# Patient Record
Sex: Female | Born: 1935 | Race: Black or African American | Hispanic: No | Marital: Married | Smoking: Never smoker
Health system: Southern US, Community
[De-identification: ages and names within clinical notes are randomized; demographics above are authoritative.]

## PROBLEM LIST (undated history)

## (undated) DIAGNOSIS — F32A Depression, unspecified: Secondary | ICD-10-CM

## (undated) DIAGNOSIS — M545 Low back pain, unspecified: Secondary | ICD-10-CM

## (undated) DIAGNOSIS — I10 Essential (primary) hypertension: Secondary | ICD-10-CM

## (undated) DIAGNOSIS — M25569 Pain in unspecified knee: Secondary | ICD-10-CM

## (undated) DIAGNOSIS — L80 Vitiligo: Secondary | ICD-10-CM

## (undated) DIAGNOSIS — M1990 Unspecified osteoarthritis, unspecified site: Secondary | ICD-10-CM

## (undated) DIAGNOSIS — F0390 Unspecified dementia without behavioral disturbance: Secondary | ICD-10-CM

## (undated) DIAGNOSIS — T7840XA Allergy, unspecified, initial encounter: Secondary | ICD-10-CM

## (undated) DIAGNOSIS — F329 Major depressive disorder, single episode, unspecified: Secondary | ICD-10-CM

## (undated) HISTORY — DX: Allergy, unspecified, initial encounter: T78.40XA

## (undated) HISTORY — DX: Low back pain, unspecified: M54.50

## (undated) HISTORY — DX: Unspecified osteoarthritis, unspecified site: M19.90

## (undated) HISTORY — DX: Depression, unspecified: F32.A

## (undated) HISTORY — DX: Vitiligo: L80

## (undated) HISTORY — DX: Major depressive disorder, single episode, unspecified: F32.9

## (undated) HISTORY — DX: Unspecified dementia without behavioral disturbance: F03.90

## (undated) HISTORY — PX: ABDOMINAL HYSTERECTOMY: SHX81

## (undated) HISTORY — DX: Low back pain: M54.5

## (undated) HISTORY — DX: Essential (primary) hypertension: I10

## (undated) HISTORY — DX: Pain in unspecified knee: M25.569

## (undated) HISTORY — PX: OTHER SURGICAL HISTORY: SHX169

---

## 1997-12-10 ENCOUNTER — Ambulatory Visit (HOSPITAL_COMMUNITY): Admission: RE | Admit: 1997-12-10 | Discharge: 1997-12-10 | Payer: Self-pay | Admitting: Neurosurgery

## 1998-05-29 ENCOUNTER — Encounter: Admission: RE | Admit: 1998-05-29 | Discharge: 1998-06-19 | Payer: Self-pay | Admitting: Internal Medicine

## 1998-10-10 ENCOUNTER — Encounter: Payer: Self-pay | Admitting: Emergency Medicine

## 1998-10-10 ENCOUNTER — Emergency Department (HOSPITAL_COMMUNITY): Admission: EM | Admit: 1998-10-10 | Discharge: 1998-10-10 | Payer: Self-pay | Admitting: Emergency Medicine

## 1998-10-16 ENCOUNTER — Emergency Department (HOSPITAL_COMMUNITY): Admission: EM | Admit: 1998-10-16 | Discharge: 1998-10-16 | Payer: Self-pay | Admitting: Emergency Medicine

## 1999-10-23 ENCOUNTER — Emergency Department (HOSPITAL_COMMUNITY): Admission: EM | Admit: 1999-10-23 | Discharge: 1999-10-23 | Payer: Self-pay | Admitting: *Deleted

## 2001-08-09 ENCOUNTER — Encounter: Admission: RE | Admit: 2001-08-09 | Discharge: 2001-08-09 | Payer: Self-pay | Admitting: Chiropractic Medicine

## 2001-08-09 ENCOUNTER — Encounter: Payer: Self-pay | Admitting: Chiropractic Medicine

## 2002-03-06 ENCOUNTER — Encounter: Payer: Self-pay | Admitting: Emergency Medicine

## 2002-03-06 ENCOUNTER — Emergency Department (HOSPITAL_COMMUNITY): Admission: EM | Admit: 2002-03-06 | Discharge: 2002-03-06 | Payer: Self-pay | Admitting: *Deleted

## 2002-04-24 ENCOUNTER — Other Ambulatory Visit: Admission: RE | Admit: 2002-04-24 | Discharge: 2002-04-24 | Payer: Self-pay | Admitting: Family Medicine

## 2003-04-25 ENCOUNTER — Encounter (INDEPENDENT_AMBULATORY_CARE_PROVIDER_SITE_OTHER): Payer: Self-pay | Admitting: Specialist

## 2003-04-25 ENCOUNTER — Ambulatory Visit (HOSPITAL_BASED_OUTPATIENT_CLINIC_OR_DEPARTMENT_OTHER): Admission: RE | Admit: 2003-04-25 | Discharge: 2003-04-25 | Payer: Self-pay | Admitting: *Deleted

## 2003-04-25 ENCOUNTER — Ambulatory Visit (HOSPITAL_COMMUNITY): Admission: RE | Admit: 2003-04-25 | Discharge: 2003-04-25 | Payer: Self-pay | Admitting: *Deleted

## 2005-06-24 ENCOUNTER — Encounter: Payer: Self-pay | Admitting: Neurosurgery

## 2005-11-02 ENCOUNTER — Emergency Department (HOSPITAL_COMMUNITY): Admission: EM | Admit: 2005-11-02 | Discharge: 2005-11-02 | Payer: Self-pay | Admitting: Family Medicine

## 2006-02-18 ENCOUNTER — Emergency Department (HOSPITAL_COMMUNITY): Admission: EM | Admit: 2006-02-18 | Discharge: 2006-02-18 | Payer: Self-pay | Admitting: Family Medicine

## 2006-11-01 ENCOUNTER — Emergency Department (HOSPITAL_COMMUNITY): Admission: EM | Admit: 2006-11-01 | Discharge: 2006-11-01 | Payer: Self-pay | Admitting: Emergency Medicine

## 2006-12-22 ENCOUNTER — Other Ambulatory Visit: Admission: RE | Admit: 2006-12-22 | Discharge: 2006-12-22 | Payer: Self-pay | Admitting: Obstetrics and Gynecology

## 2006-12-24 ENCOUNTER — Ambulatory Visit: Payer: Self-pay | Admitting: Internal Medicine

## 2006-12-24 ENCOUNTER — Encounter: Payer: Self-pay | Admitting: Internal Medicine

## 2006-12-24 DIAGNOSIS — F4321 Adjustment disorder with depressed mood: Secondary | ICD-10-CM | POA: Insufficient documentation

## 2006-12-24 DIAGNOSIS — I10 Essential (primary) hypertension: Secondary | ICD-10-CM | POA: Insufficient documentation

## 2006-12-24 DIAGNOSIS — M545 Low back pain: Secondary | ICD-10-CM

## 2007-01-11 ENCOUNTER — Ambulatory Visit: Payer: Self-pay | Admitting: Internal Medicine

## 2007-02-01 ENCOUNTER — Ambulatory Visit: Payer: Self-pay | Admitting: Internal Medicine

## 2007-05-03 ENCOUNTER — Ambulatory Visit: Payer: Self-pay | Admitting: Internal Medicine

## 2007-05-03 DIAGNOSIS — M79609 Pain in unspecified limb: Secondary | ICD-10-CM | POA: Insufficient documentation

## 2007-05-06 ENCOUNTER — Ambulatory Visit: Payer: Self-pay

## 2007-07-06 ENCOUNTER — Ambulatory Visit: Payer: Self-pay | Admitting: Internal Medicine

## 2007-07-06 DIAGNOSIS — M1990 Unspecified osteoarthritis, unspecified site: Secondary | ICD-10-CM

## 2007-07-06 DIAGNOSIS — M25519 Pain in unspecified shoulder: Secondary | ICD-10-CM | POA: Insufficient documentation

## 2007-10-05 ENCOUNTER — Ambulatory Visit: Payer: Self-pay | Admitting: Internal Medicine

## 2007-10-07 ENCOUNTER — Encounter: Payer: Self-pay | Admitting: Internal Medicine

## 2007-10-18 ENCOUNTER — Encounter: Admission: RE | Admit: 2007-10-18 | Discharge: 2007-12-14 | Payer: Self-pay | Admitting: Family Medicine

## 2007-12-27 ENCOUNTER — Other Ambulatory Visit: Admission: RE | Admit: 2007-12-27 | Discharge: 2007-12-27 | Payer: Self-pay | Admitting: Obstetrics and Gynecology

## 2008-01-27 ENCOUNTER — Ambulatory Visit: Payer: Self-pay | Admitting: Internal Medicine

## 2008-02-24 DIAGNOSIS — L80 Vitiligo: Secondary | ICD-10-CM

## 2008-02-24 DIAGNOSIS — F0390 Unspecified dementia without behavioral disturbance: Secondary | ICD-10-CM

## 2008-02-24 HISTORY — DX: Unspecified dementia, unspecified severity, without behavioral disturbance, psychotic disturbance, mood disturbance, and anxiety: F03.90

## 2008-02-24 HISTORY — DX: Vitiligo: L80

## 2008-02-28 ENCOUNTER — Ambulatory Visit: Payer: Self-pay | Admitting: Internal Medicine

## 2008-02-28 DIAGNOSIS — J019 Acute sinusitis, unspecified: Secondary | ICD-10-CM

## 2008-03-05 ENCOUNTER — Emergency Department (HOSPITAL_COMMUNITY): Admission: EM | Admit: 2008-03-05 | Discharge: 2008-03-05 | Payer: Self-pay | Admitting: Family Medicine

## 2008-03-08 ENCOUNTER — Encounter: Payer: Self-pay | Admitting: Internal Medicine

## 2008-03-26 ENCOUNTER — Ambulatory Visit: Payer: Self-pay | Admitting: Internal Medicine

## 2008-03-26 DIAGNOSIS — R930 Abnormal findings on diagnostic imaging of skull and head, not elsewhere classified: Secondary | ICD-10-CM | POA: Insufficient documentation

## 2008-03-26 DIAGNOSIS — R05 Cough: Secondary | ICD-10-CM

## 2008-03-26 DIAGNOSIS — J069 Acute upper respiratory infection, unspecified: Secondary | ICD-10-CM | POA: Insufficient documentation

## 2008-03-29 ENCOUNTER — Telehealth: Payer: Self-pay | Admitting: Internal Medicine

## 2008-04-06 ENCOUNTER — Telehealth: Payer: Self-pay | Admitting: Internal Medicine

## 2008-04-11 ENCOUNTER — Telehealth: Payer: Self-pay | Admitting: Internal Medicine

## 2008-04-27 ENCOUNTER — Ambulatory Visit: Payer: Self-pay | Admitting: Internal Medicine

## 2008-04-27 DIAGNOSIS — J45909 Unspecified asthma, uncomplicated: Secondary | ICD-10-CM

## 2008-04-27 DIAGNOSIS — J309 Allergic rhinitis, unspecified: Secondary | ICD-10-CM | POA: Insufficient documentation

## 2008-05-01 LAB — CONVERTED CEMR LAB
BUN: 25 mg/dL — ABNORMAL HIGH (ref 6–23)
Creatinine, Ser: 0.8 mg/dL (ref 0.4–1.2)
Glucose, Bld: 97 mg/dL (ref 70–99)
Sodium: 143 meq/L (ref 135–145)

## 2008-05-04 ENCOUNTER — Ambulatory Visit: Payer: Self-pay | Admitting: Cardiovascular Disease

## 2008-05-08 ENCOUNTER — Telehealth: Payer: Self-pay | Admitting: Internal Medicine

## 2008-05-09 ENCOUNTER — Telehealth: Payer: Self-pay | Admitting: Internal Medicine

## 2008-05-14 ENCOUNTER — Ambulatory Visit: Payer: Self-pay | Admitting: Internal Medicine

## 2008-05-14 DIAGNOSIS — R42 Dizziness and giddiness: Secondary | ICD-10-CM | POA: Insufficient documentation

## 2008-05-14 DIAGNOSIS — E876 Hypokalemia: Secondary | ICD-10-CM | POA: Insufficient documentation

## 2008-05-18 LAB — CONVERTED CEMR LAB
CO2: 33 meq/L — ABNORMAL HIGH (ref 19–32)
Calcium: 9.4 mg/dL (ref 8.4–10.5)
Chloride: 104 meq/L (ref 96–112)
Creatinine, Ser: 0.8 mg/dL (ref 0.4–1.2)
GFR calc non Af Amer: 90.87 mL/min (ref >60)
Sodium: 142 meq/L (ref 135–145)

## 2008-05-22 ENCOUNTER — Ambulatory Visit: Payer: Self-pay | Admitting: Pulmonary Disease

## 2008-05-22 DIAGNOSIS — J984 Other disorders of lung: Secondary | ICD-10-CM | POA: Insufficient documentation

## 2008-05-28 ENCOUNTER — Ambulatory Visit (HOSPITAL_COMMUNITY): Admission: RE | Admit: 2008-05-28 | Discharge: 2008-05-28 | Payer: Self-pay | Admitting: Internal Medicine

## 2008-05-30 ENCOUNTER — Ambulatory Visit: Payer: Self-pay | Admitting: Internal Medicine

## 2008-06-13 ENCOUNTER — Emergency Department (HOSPITAL_COMMUNITY): Admission: EM | Admit: 2008-06-13 | Discharge: 2008-06-13 | Payer: Self-pay | Admitting: Family Medicine

## 2008-07-03 ENCOUNTER — Telehealth (INDEPENDENT_AMBULATORY_CARE_PROVIDER_SITE_OTHER): Payer: Self-pay | Admitting: *Deleted

## 2008-07-12 ENCOUNTER — Ambulatory Visit: Payer: Self-pay | Admitting: Internal Medicine

## 2008-07-25 ENCOUNTER — Ambulatory Visit: Payer: Self-pay | Admitting: Internal Medicine

## 2008-08-06 ENCOUNTER — Telehealth: Payer: Self-pay | Admitting: Internal Medicine

## 2008-08-10 ENCOUNTER — Ambulatory Visit: Payer: Self-pay | Admitting: Internal Medicine

## 2008-08-20 ENCOUNTER — Ambulatory Visit: Payer: Self-pay | Admitting: Internal Medicine

## 2008-09-04 ENCOUNTER — Ambulatory Visit: Payer: Self-pay | Admitting: Internal Medicine

## 2008-09-05 ENCOUNTER — Ambulatory Visit: Payer: Self-pay | Admitting: Internal Medicine

## 2008-09-12 ENCOUNTER — Encounter: Payer: Self-pay | Admitting: Internal Medicine

## 2008-09-12 ENCOUNTER — Ambulatory Visit: Payer: Self-pay | Admitting: Thoracic Surgery

## 2008-09-21 ENCOUNTER — Ambulatory Visit: Payer: Self-pay | Admitting: Emergency Medicine

## 2008-09-21 ENCOUNTER — Ambulatory Visit (HOSPITAL_COMMUNITY): Admission: RE | Admit: 2008-09-21 | Discharge: 2008-09-21 | Payer: Self-pay | Admitting: Thoracic Surgery

## 2008-09-21 ENCOUNTER — Encounter: Payer: Self-pay | Admitting: Thoracic Surgery

## 2008-09-21 ENCOUNTER — Ambulatory Visit: Payer: Self-pay | Admitting: Thoracic Surgery

## 2008-09-25 ENCOUNTER — Ambulatory Visit: Payer: Self-pay | Admitting: Thoracic Surgery

## 2008-09-26 ENCOUNTER — Encounter: Payer: Self-pay | Admitting: Internal Medicine

## 2008-10-17 ENCOUNTER — Ambulatory Visit: Payer: Self-pay | Admitting: Thoracic Surgery

## 2008-10-19 ENCOUNTER — Encounter: Payer: Self-pay | Admitting: Thoracic Surgery

## 2008-10-19 ENCOUNTER — Inpatient Hospital Stay (HOSPITAL_COMMUNITY): Admission: RE | Admit: 2008-10-19 | Discharge: 2008-10-24 | Payer: Self-pay | Admitting: Thoracic Surgery

## 2008-10-19 ENCOUNTER — Ambulatory Visit: Payer: Self-pay | Admitting: Thoracic Surgery

## 2008-10-22 ENCOUNTER — Ambulatory Visit: Payer: Self-pay | Admitting: Internal Medicine

## 2008-10-30 ENCOUNTER — Encounter: Admission: RE | Admit: 2008-10-30 | Discharge: 2008-10-30 | Payer: Self-pay | Admitting: Thoracic Surgery

## 2008-10-30 ENCOUNTER — Ambulatory Visit: Payer: Self-pay | Admitting: Thoracic Surgery

## 2008-11-20 ENCOUNTER — Ambulatory Visit: Payer: Self-pay | Admitting: Thoracic Surgery

## 2008-11-20 ENCOUNTER — Encounter: Admission: RE | Admit: 2008-11-20 | Discharge: 2008-11-20 | Payer: Self-pay | Admitting: Thoracic Surgery

## 2008-12-10 ENCOUNTER — Encounter: Admission: RE | Admit: 2008-12-10 | Discharge: 2008-12-10 | Payer: Self-pay | Admitting: Neurology

## 2009-01-02 ENCOUNTER — Encounter: Admission: RE | Admit: 2009-01-02 | Discharge: 2009-01-02 | Payer: Self-pay | Admitting: Thoracic Surgery

## 2009-01-02 ENCOUNTER — Ambulatory Visit: Payer: Self-pay | Admitting: Thoracic Surgery

## 2009-03-15 ENCOUNTER — Other Ambulatory Visit: Admission: RE | Admit: 2009-03-15 | Discharge: 2009-03-15 | Payer: Self-pay | Admitting: Obstetrics and Gynecology

## 2009-03-27 ENCOUNTER — Ambulatory Visit: Payer: Self-pay | Admitting: Thoracic Surgery

## 2009-03-27 ENCOUNTER — Encounter: Admission: RE | Admit: 2009-03-27 | Discharge: 2009-03-27 | Payer: Self-pay | Admitting: Thoracic Surgery

## 2009-06-17 ENCOUNTER — Encounter: Payer: Self-pay | Admitting: Internal Medicine

## 2009-07-09 ENCOUNTER — Encounter: Payer: Self-pay | Admitting: Internal Medicine

## 2009-07-18 ENCOUNTER — Ambulatory Visit: Payer: Self-pay | Admitting: Thoracic Surgery

## 2009-07-24 ENCOUNTER — Ambulatory Visit: Payer: Self-pay | Admitting: Thoracic Surgery

## 2009-07-24 ENCOUNTER — Encounter: Admission: RE | Admit: 2009-07-24 | Discharge: 2009-07-24 | Payer: Self-pay | Admitting: Thoracic Surgery

## 2009-11-26 ENCOUNTER — Ambulatory Visit: Payer: Self-pay | Admitting: Internal Medicine

## 2009-11-26 DIAGNOSIS — F0280 Dementia in other diseases classified elsewhere without behavioral disturbance: Secondary | ICD-10-CM

## 2009-11-26 DIAGNOSIS — L80 Vitiligo: Secondary | ICD-10-CM

## 2009-11-26 DIAGNOSIS — G309 Alzheimer's disease, unspecified: Secondary | ICD-10-CM

## 2009-11-27 ENCOUNTER — Encounter (INDEPENDENT_AMBULATORY_CARE_PROVIDER_SITE_OTHER): Payer: Self-pay | Admitting: *Deleted

## 2009-12-11 ENCOUNTER — Encounter: Payer: Self-pay | Admitting: Internal Medicine

## 2009-12-24 ENCOUNTER — Encounter: Payer: Self-pay | Admitting: Internal Medicine

## 2010-01-14 ENCOUNTER — Telehealth: Payer: Self-pay | Admitting: Internal Medicine

## 2010-01-20 ENCOUNTER — Encounter (INDEPENDENT_AMBULATORY_CARE_PROVIDER_SITE_OTHER): Payer: Self-pay | Admitting: *Deleted

## 2010-02-11 ENCOUNTER — Encounter: Payer: Self-pay | Admitting: Internal Medicine

## 2010-03-16 ENCOUNTER — Encounter: Payer: Self-pay | Admitting: Internal Medicine

## 2010-03-17 ENCOUNTER — Telehealth: Payer: Self-pay | Admitting: Internal Medicine

## 2010-03-18 ENCOUNTER — Ambulatory Visit
Admission: RE | Admit: 2010-03-18 | Discharge: 2010-03-18 | Payer: Self-pay | Source: Home / Self Care | Attending: Internal Medicine | Admitting: Internal Medicine

## 2010-03-18 DIAGNOSIS — N644 Mastodynia: Secondary | ICD-10-CM | POA: Insufficient documentation

## 2010-03-25 NOTE — Letter (Signed)
Summary[facility]   Imported By: Lennie Odor 01/08/2010 14:07:27  _____________________________________________________________________  External Attachment:    Type:   Image     Comment:   External Document

## 2010-03-25 NOTE — Letter (Signed)
Summary: [facility]  [facility]   Imported By: Lester Runnels 12/25/2009 09:19:10  _____________________________________________________________________  External Attachment:    Type:   Image     Comment:   External Document

## 2010-03-25 NOTE — Letter (Signed)
Summary: [facility]  [facility]   Imported By: Sherian Rein 07/31/2009 07:49:03  _____________________________________________________________________  External Attachment:    Type:   Image     Comment:   External Document

## 2010-03-25 NOTE — Letter (Signed)
Summary: [facility]  [facility]   Imported By: Sherian Rein 06/26/2009 10:47:48  _____________________________________________________________________  External Attachment:    Type:   Image     Comment:   External Document

## 2010-03-25 NOTE — Letter (Signed)
Summary: [facility] Consult Scheduled Letter  [facility]  [address] [place], [place] [address]   Phone: [phone]  Fax: [phone]      11/27/2009 MRN: [id number]  Ketrina Hartshorn [address] [place], [place]  [address]    Dear Ms. Azucena Kuba,      We have scheduled an appointment for you.  At the recommendation of Dr.Plotnikov, we have scheduled you a consult with Dr Danella Deis on 12/24/09 at 10:00am.  Their phone number is [phone].  If this appointment day and time is not convenient for you, please feel free to call the office of the doctor you are being referred to at the number listed above and reschedule the appointment.    [facility] [address] [place] [place], [place] [address]    Thank you,  Patient Care Coordinator [facility]

## 2010-03-25 NOTE — Letter (Signed)
Summary: [facility] Consult Scheduled Letter  [facility]  [address] [place], [place] [address]   Phone: [phone]  Fax: [phone]      01/20/2010 MRN: [id number]  Shadawn Valido [address] [place], [place]  [address]    Dear Ms. Azucena Kuba,      We have scheduled an appointment for you.  At the recommendation of Dr.Plotnikov, we have scheduled you a consult with Dr Gilmore Laroche on 04/11/10 at 10:30am.  Their phone number is [phone].  If this appointment day and time is not convenient for you, please feel free to call the office of the doctor you are being referred to at the number listed above and reschedule the appointment.    [facility] [address] [place], [place] [address]   *[facility] will be mailing new patient paper work and directions.*    Thank you,  Patient Care Coordinator [facility]

## 2010-03-25 NOTE — Assessment & Plan Note (Signed)
Summary: f/u appt/#/cd   Vital Signs:  Patient profile:   76 year old female Height:      62 inches Weight:      163 pounds BMI:     29.92 Temp:     97.7 degrees F oral Pulse rate:   67 / minute Pulse rhythm:   regular BP sitting:   180 / 90  (left arm) Cuff size:   regular  Vitals Entered By: Lanier Prude, CMA(AAMA) (November 26, 2009 11:42 AM) CC: f/u   Primary Care Provider:  Dr. Posey Rea  CC:  f/u.  History of Present Illness: C/o spots on skin - needs a derm ref. F/u HTN, memory loss. BP 130/70  Current Medications (verified): 1)  Glucosamine-Chondroitin 1500-1200 Mg/64ml  Liqd (Glucosamine-Chondroitin) .Marland Kitchen.. 1 Bid 2)  Bl Flax Seed Oil 1000 Mg  Caps (Flaxseed (Linseed)) .Marland Kitchen.. 1 Qd 3)  Vitamin D3 1000 Unit  Tabs (Cholecalciferol) .Marland Kitchen.. 1 Qd 4)  Darvocet-N 100 100-650 Mg Tabs (Propoxyphene N-Apap) .Marland Kitchen.. 1 By Mouth Two Times A Day As Needed Pain 5)  Lorazepam 0.5 Mg Tabs (Lorazepam) .Marland Kitchen.. 1 By Mouth Two Times A Day As Needed Anxiety and Dizziness 6)  Amlodipine Besylate 5 Mg  Tabs (Amlodipine Besylate) .Marland Kitchen.. 1 By Mouth Every Day 7)  Hydrochlorothiazide 12.5 Mg  Tabs (Hydrochlorothiazide) .... Take 1 Tab  By Mouth Every Morning  Allergies (verified): 1)  ! * Exforge 2)  ! Diovan 3)  ! Ace Inhibitors  Past History:  Past Medical History: Depression Hypertension Low back pain B knee pain [name] Osteoarthritis Allergic rhinitis Asthma > probably all pseudoasthma    - D/C  ACE  May 22, 2008  > pseudowheeze resolved, cough resolved SPN  RLL    - See CT Chest 05/04/08    - PET 05/28/08  Nodule cold but nonspecific uptake in multiple nodes    - CXR September 04, 2008 slt growth and more dense Dementia 2010 Dr Terrace Arabia Vitiligo 2010  Past Surgical History: Hysterectomy Hystoplasmosis lung biopsy 2010 Dr Wenda Overland  Review of Systems  The patient denies weight loss, vision loss, prolonged cough, and abdominal pain.    Physical Exam  General:  alert, well-developed,  well-nourished, well-hydrated, and overweight-appearing.   Mouth:  Oral mucosa and oropharynx without lesions or exudates.  Teeth in good repair. Heart:  Normal rate and regular rhythm. S1 and S2 normal without gallop, murmur, click, rub or other extra sounds. Abdomen:  soft, non-tender, normal bowel sounds, no distention, no masses, no guarding, no rigidity, no rebound tenderness, no abdominal hernia, no inguinal hernia, no hepatomegaly, and no splenomegaly.   Msk:  No deformity or scoliosis noted of thoracic or lumbar spine.   Neurologic:  No cranial nerve deficits noted. Station and gait are normal. Plantar reflexes are down-going bilaterally. DTRs are symmetrical throughout. Sensory, motor and coordinative functions appear intact. Skin:  Intact without suspicious lesions or rashes Psych:  Cognition and judgment appear intact. Alert and cooperative with normal attention span and concentration. No apparent delusions, illusions, hallucinations   Impression & Recommendations:  Problem # 1:  HYPERTENSION (ICD-401.9) Assessment Comment Only States BP is nl at home Her updated medication list for this problem includes:    Amlodipine Besylate 5 Mg Tabs (Amlodipine besylate) .Marland Kitchen... 1 by mouth every day    Hydrochlorothiazide 12.5 Mg Tabs (Hydrochlorothiazide) .Marland Kitchen... Take 1 tab  by mouth every morning  Problem # 2:  VITILIGO (ICD-709.01) Assessment: Deteriorated  Orders: Dermatology Referral (Derma)  Problem # 3:  DEMENTIA (ICD-294.8) Assessment: Unchanged On the regimen of medicine(s) reflected in the chart    Problem # 4:  OSTEOARTHRITIS (ICD-715.90) Assessment: Unchanged  Her updated medication list for this problem includes:    Darvocet-n 100 100-650 Mg Tabs (Propoxyphene n-apap) .Marland Kitchen... 1 by mouth two times a day as needed pain  Complete Medication List: 1)  Glucosamine-chondroitin 1500-1200 Mg/3ml Liqd (Glucosamine-chondroitin) .Marland Kitchen.. 1 bid 2)  Bl Flax Seed Oil 1000 Mg Caps (Flaxseed  (linseed)) .Marland Kitchen.. 1 qd 3)  Vitamin D3 1000 Unit Tabs (Cholecalciferol) .Marland Kitchen.. 1 qd 4)  Darvocet-n 100 100-650 Mg Tabs (Propoxyphene n-apap) .Marland Kitchen.. 1 by mouth two times a day as needed pain 5)  Lorazepam 0.5 Mg Tabs (Lorazepam) .Marland Kitchen.. 1 by mouth two times a day as needed anxiety and dizziness 6)  Amlodipine Besylate 5 Mg Tabs (Amlodipine besylate) .Marland Kitchen.. 1 by mouth every day 7)  Hydrochlorothiazide 12.5 Mg Tabs (Hydrochlorothiazide) .... Take 1 tab  by mouth every morning 8)  Exelon 4.6 Mg/24hr Pt24 (Rivastigmine) .Marland Kitchen.. 1 qd  Patient Instructions: 1)  Normal BP <130/85 2)  Please schedule a follow-up appointment in 6 months well w/labs.  Contraindications/Deferment of Procedures/Staging:    Test/Procedure: FLU VAX    Reason for deferment: patient declined

## 2010-03-25 NOTE — Progress Notes (Signed)
  Phone Note Call from Patient   Caller: [phone] Summary of Call: Patient husband called  due to her many health complications, he would like help getting her set up for a full evaluation with the GET evaluation clinic at [facility]. He would like to know if MD will help with referral..Lakisha Archie CMA  January 14, 2010 11:56 AM   Follow-up for Phone Call        ok Follow-up by: Tresa Garter MD,  January 14, 2010 1:22 PM  Additional Follow-up for Phone Call Additional follow up Details #1::        Patient husband notified.Alvy Beal Archie CMA  January 14, 2010 1:34 PM

## 2010-03-27 NOTE — Progress Notes (Signed)
Summary: OV TOMORROW - FYI  Phone Note Call from Patient   Summary of Call: Pt's husband called - pt c/o left breast swelling since surgery. They were advised by Dr Edwyna Shell that she needs to f/u w/Plotnikov. Scheduled for office visit tomorrow am.  Initial call taken by: Lamar Sprinkles, CMA,  March 17, 2010 12:12 PM  Follow-up for Phone Call        Noted. Thank you!  Follow-up by: Tresa Garter MD,  March 17, 2010 5:31 PM

## 2010-03-27 NOTE — Assessment & Plan Note (Signed)
Summary: right breast swelling off and on/SD   Vital Signs:  Patient profile:   76 year old female Height:      62 inches Weight:      158 pounds BMI:     29.00 Temp:     98.5 degrees F oral Pulse rate:   68 / minute Pulse rhythm:   regular Resp:     16 per minute BP sitting:   150 / 88  (left arm) Cuff size:   regular  Vitals Entered By: Lanier Prude, CMA(AAMA) (March 18, 2010 1:53 PM) CC: Rt breast sore & swelling Is Patient Diabetic? No   Primary Care Provider:  Dr. Posey Rea  CC:  Rt breast sore & swelling.  History of Present Illness: C/o swelling in R breast x 3-4 months  She had a mammo 3 wks ago  Current Medications (verified): 1)  Glucosamine-Chondroitin 1500-1200 Mg/51ml  Liqd (Glucosamine-Chondroitin) .Marland Kitchen.. 1 Bid 2)  Bl Flax Seed Oil 1000 Mg  Caps (Flaxseed (Linseed)) .Marland Kitchen.. 1 Qd 3)  Vitamin D3 1000 Unit  Tabs (Cholecalciferol) .Marland Kitchen.. 1 Qd 4)  Darvocet-N 100 100-650 Mg Tabs (Propoxyphene N-Apap) .Marland Kitchen.. 1 By Mouth Two Times A Day As Needed Pain 5)  Lorazepam 0.5 Mg Tabs (Lorazepam) .Marland Kitchen.. 1 By Mouth Two Times A Day As Needed Anxiety and Dizziness 6)  Amlodipine Besylate 5 Mg  Tabs (Amlodipine Besylate) .Marland Kitchen.. 1 By Mouth Every Day 7)  Hydrochlorothiazide 12.5 Mg  Tabs (Hydrochlorothiazide) .... Take 1 Tab  By Mouth Every Morning 8)  Exelon 4.6 Mg/24hr Pt24 (Rivastigmine) .Marland Kitchen.. 1 Qd  Allergies (verified): 1)  ! * Exforge 2)  ! Diovan 3)  ! Ace Inhibitors  Past History:  Past Medical History: Last updated: 11/26/2009 Depression Hypertension Low back pain B knee pain [name] Osteoarthritis Allergic rhinitis Asthma > probably all pseudoasthma    - D/C  ACE  May 22, 2008  > pseudowheeze resolved, cough resolved SPN  RLL    - See CT Chest 05/04/08    - PET 05/28/08  Nodule cold but nonspecific uptake in multiple nodes    - CXR September 04, 2008 slt growth and more dense Dementia 2010 Dr Terrace Arabia Vitiligo 2010  Social History: Last updated:  05/22/2008 Retired Married Never Smoked Last heavy exposure 1994  Review of Systems  The patient denies fever.    Physical Exam  General:  alert, well-developed, well-nourished, well-hydrated, and overweight-appearing.   Breasts:  R breast with mild fibrocystic changes - tender at 4 o'clock. No discreat mass no adenopathy.   Lungs:  normal respiratory effort, no intercostal retractions, no accessory muscle use, normal breath sounds, no dullness, no fremitus, no crackles, and no wheezes.   Heart:  Normal rate and regular rhythm. S1 and S2 normal without gallop, murmur, click, rub or other extra sounds. Skin:  vetiligo Psych:  Cognition and judgment appear intact. Alert and cooperative with normal attention span and concentration. No apparent delusions, illusions, hallucinations   Impression & Recommendations:  Problem # 1:  BREAST PAIN (ICD-611.71) R - fatty tissue or cystic inflamation Assessment New Mammo was normal on R on 12/26 - reviewed NSAID see RX Vit E  Complete Medication List: 1)  Glucosamine-chondroitin 1500-1200 Mg/36ml Liqd (Glucosamine-chondroitin) .Marland Kitchen.. 1 bid 2)  Bl Flax Seed Oil 1000 Mg Caps (Flaxseed (linseed)) .Marland Kitchen.. 1 qd 3)  Vitamin D3 1000 Unit Tabs (Cholecalciferol) .Marland Kitchen.. 1 qd 4)  Darvocet-n 100 100-650 Mg Tabs (Propoxyphene n-apap) .Marland Kitchen.. 1 by mouth two times a day  as needed pain 5)  Lorazepam 0.5 Mg Tabs (Lorazepam) .Marland Kitchen.. 1 by mouth two times a day as needed anxiety and dizziness 6)  Amlodipine Besylate 5 Mg Tabs (Amlodipine besylate) .Marland Kitchen.. 1 by mouth every day 7)  Hydrochlorothiazide 12.5 Mg Tabs (Hydrochlorothiazide) .... Take 1 tab  by mouth every morning 8)  Exelon 4.6 Mg/24hr Pt24 (Rivastigmine) .Marland Kitchen.. 1 qd 9)  Ibuprofen 400 Mg Tabs (Ibuprofen) .Marland Kitchen.. 1 by mouth two times a day pc x 2 wks, then 1 by mouth two times a day prn  Patient Instructions: 1)  Vit E 400 or 600 international units a day Prescriptions: IBUPROFEN 400 MG TABS (IBUPROFEN) 1 by mouth two  times a day pc x 2 wks, then 1 by mouth two times a day prn  #60 x 2   Entered and Authorized by:   Tresa Garter MD   Signed by:   Tresa Garter MD on 03/18/2010   Method used:   Electronically to        [facility] [phone]* (retail)       [address]       [place], [place]  [address]       Ph: [phone]       Fax: [phone]   RxID:   [id number]    Orders Added: 1)  Est. Patient Level III [[address]]

## 2010-04-11 ENCOUNTER — Encounter: Payer: Self-pay | Admitting: Internal Medicine

## 2010-05-01 NOTE — Consult Note (Signed)
Summary: [facility]  [facility]   Imported By: Sherian Rein 04/25/2010 12:36:25  _____________________________________________________________________  External Attachment:    Type:   Image     Comment:   External Document

## 2010-05-06 ENCOUNTER — Telehealth: Payer: Self-pay | Admitting: Internal Medicine

## 2010-05-13 NOTE — Progress Notes (Signed)
Summary: Aricept ?  Phone Note From Pharmacy   Summary of Call: rec fax req for Donepezil 10mg  1 by mouth once daily for #90 due to ins requirements. This med is not on active list. Please advise Initial call taken by: Lanier Prude, [facility]),  May 06, 2010 10:06 AM  Follow-up for Phone Call        ok x 6 if she stopped Exelon Follow-up by: Tresa Garter MD,  May 07, 2010 10:54 PM  Additional Follow-up for Phone Call Additional follow up Details #1::        pt states she is not using Exelon. Wants rf sent [facility].    New/Updated Medications: DONEPEZIL HCL 10 MG TABS (DONEPEZIL HCL) 1 by mouth once daily Prescriptions: DONEPEZIL HCL 10 MG TABS (DONEPEZIL HCL) 1 by mouth once daily  #90 x 2   Entered by:   Lanier Prude, CMA(AAMA)   Authorized by:   Tresa Garter MD   Signed by:   Lanier Prude, [facility]) on 05/08/2010   Method used:   [facility]        [facility] [phone][facility] (retail)       [address]       [place], [place]  [address]       Ph: [phone]       Fax: [phone]   RxID:   [id number]

## 2010-05-30 LAB — BASIC METABOLIC PANEL
BUN: 19 mg/dL (ref 6–23)
Calcium: 9 mg/dL (ref 8.4–10.5)
GFR calc non Af Amer: >60 mL/min (ref >60)
Glucose, Bld: 108 mg/dL — ABNORMAL HIGH (ref 70–99)

## 2010-05-30 LAB — CBC
Platelets: 206 10*3/uL (ref 150–400)
RDW: 16.6 % — ABNORMAL HIGH (ref 11.5–15.5)
WBC: 13.3 10*3/uL — ABNORMAL HIGH (ref 4.0–10.5)

## 2010-05-30 LAB — GLUCOSE, CAPILLARY: Glucose-Capillary: 126 mg/dL — ABNORMAL HIGH (ref 70–99)

## 2010-05-31 LAB — CBC
HCT: 32.5 % — ABNORMAL LOW (ref 36.0–46.0)
HCT: 34.3 % — ABNORMAL LOW (ref 36.0–46.0)
Hemoglobin: 10.7 g/dL — ABNORMAL LOW (ref 12.0–15.0)
Hemoglobin: 10.9 g/dL — ABNORMAL LOW (ref 12.0–15.0)
Hemoglobin: 11.2 g/dL — ABNORMAL LOW (ref 12.0–15.0)
MCHC: 32.6 g/dL (ref 30.0–36.0)
MCHC: 32.8 g/dL (ref 30.0–36.0)
MCHC: 33 g/dL (ref 30.0–36.0)
MCHC: 33.1 g/dL (ref 30.0–36.0)
MCV: 81.2 fL (ref 78.0–100.0)
MCV: 82.3 fL (ref 78.0–100.0)
MCV: 82.3 fL (ref 78.0–100.0)
MCV: 82.3 fL (ref 78.0–100.0)
Platelets: 209 10*3/uL (ref 150–400)
RBC: 3.95 MIL/uL (ref 3.87–5.11)
RBC: 3.99 MIL/uL (ref 3.87–5.11)
RBC: 4.17 MIL/uL (ref 3.87–5.11)
RBC: 4.68 MIL/uL (ref 3.87–5.11)
RDW: 16.4 % — ABNORMAL HIGH (ref 11.5–15.5)
RDW: 16.5 % — ABNORMAL HIGH (ref 11.5–15.5)
WBC: 14.1 10*3/uL — ABNORMAL HIGH (ref 4.0–10.5)
WBC: 16.9 10*3/uL — ABNORMAL HIGH (ref 4.0–10.5)

## 2010-05-31 LAB — FUNGUS CULTURE W SMEAR

## 2010-05-31 LAB — BASIC METABOLIC PANEL
CO2: 28 mEq/L (ref 19–32)
CO2: 30 mEq/L (ref 19–32)
Chloride: 101 mEq/L (ref 96–112)
Chloride: 103 mEq/L (ref 96–112)
Creatinine, Ser: 0.71 mg/dL (ref 0.4–1.2)
GFR calc Af Amer: >60 mL/min (ref >60)
GFR calc Af Amer: >60 mL/min (ref >60)
Glucose, Bld: 96 mg/dL (ref 70–99)
Potassium: 3.5 mEq/L (ref 3.5–5.1)
Sodium: 137 mEq/L (ref 135–145)
Sodium: 137 mEq/L (ref 135–145)

## 2010-05-31 LAB — COMPREHENSIVE METABOLIC PANEL
ALT: 19 U/L (ref 0–35)
AST: 28 U/L (ref 0–37)
Alkaline Phosphatase: 78 U/L (ref 39–117)
BUN: 10 mg/dL (ref 6–23)
CO2: 27 mEq/L (ref 19–32)
CO2: 28 mEq/L (ref 19–32)
Calcium: 8.3 mg/dL — ABNORMAL LOW (ref 8.4–10.5)
Chloride: 103 mEq/L (ref 96–112)
Chloride: 104 mEq/L (ref 96–112)
Creatinine, Ser: 0.68 mg/dL (ref 0.4–1.2)
GFR calc Af Amer: >60 mL/min (ref >60)
GFR calc non Af Amer: >60 mL/min (ref >60)
GFR calc non Af Amer: >60 mL/min (ref >60)
Glucose, Bld: 100 mg/dL — ABNORMAL HIGH (ref 70–99)
Glucose, Bld: 118 mg/dL — ABNORMAL HIGH (ref 70–99)
Potassium: 4.1 mEq/L (ref 3.5–5.1)
Total Bilirubin: 0.8 mg/dL (ref 0.3–1.2)
Total Bilirubin: 0.9 mg/dL (ref 0.3–1.2)
Total Protein: 6.9 g/dL (ref 6.0–8.3)

## 2010-05-31 LAB — BLOOD GAS, ARTERIAL
Acid-Base Excess: 4.1 mmol/L — ABNORMAL HIGH (ref 0.0–2.0)
Drawn by: 206361
O2 Saturation: 96.5 %
Patient temperature: 98.6

## 2010-05-31 LAB — PROTIME-INR
INR: 1.1 (ref 0.00–1.49)
Prothrombin Time: 13.7 seconds (ref 11.6–15.2)

## 2010-05-31 LAB — GLUCOSE, CAPILLARY
Glucose-Capillary: 129 mg/dL — ABNORMAL HIGH (ref 70–99)
Glucose-Capillary: 129 mg/dL — ABNORMAL HIGH (ref 70–99)
Glucose-Capillary: 139 mg/dL — ABNORMAL HIGH (ref 70–99)
Glucose-Capillary: 74 mg/dL (ref 70–99)
Glucose-Capillary: 96 mg/dL (ref 70–99)

## 2010-05-31 LAB — TYPE AND SCREEN: ABO/RH(D): O POS

## 2010-05-31 LAB — URINALYSIS, ROUTINE W REFLEX MICROSCOPIC
Bilirubin Urine: NEGATIVE
Glucose, UA: NEGATIVE mg/dL
Hgb urine dipstick: NEGATIVE
Protein, ur: NEGATIVE mg/dL
Urobilinogen, UA: 0.2 mg/dL (ref 0.0–1.0)

## 2010-05-31 LAB — POCT I-STAT 3, ART BLOOD GAS (G3+)
Bicarbonate: 28.4 mEq/L — ABNORMAL HIGH (ref 20.0–24.0)
Patient temperature: 97.9
TCO2: 30 mmol/L (ref 0–100)
pH, Arterial: 7.37 (ref 7.350–7.400)
pO2, Arterial: 53 mmHg — ABNORMAL LOW (ref 80.0–100.0)

## 2010-05-31 LAB — AFB CULTURE WITH SMEAR (NOT AT ARMC): Acid Fast Smear: NONE SEEN

## 2010-05-31 LAB — URINE MICROSCOPIC-ADD ON

## 2010-05-31 LAB — MRSA PCR SCREENING: MRSA by PCR: NEGATIVE

## 2010-05-31 LAB — TISSUE CULTURE

## 2010-06-01 LAB — APTT: aPTT: 47 seconds — ABNORMAL HIGH (ref 24–37)

## 2010-06-01 LAB — TYPE AND SCREEN
ABO/RH(D): O POS
Antibody Screen: NEGATIVE

## 2010-06-01 LAB — COMPREHENSIVE METABOLIC PANEL
AST: 26 U/L (ref 0–37)
CO2: 28 mEq/L (ref 19–32)
Calcium: 9.7 mg/dL (ref 8.4–10.5)
Creatinine, Ser: 0.74 mg/dL (ref 0.4–1.2)
GFR calc Af Amer: >60 mL/min (ref >60)
GFR calc non Af Amer: >60 mL/min (ref >60)

## 2010-06-01 LAB — CULTURE, RESPIRATORY W GRAM STAIN

## 2010-06-01 LAB — CBC
MCHC: 32.5 g/dL (ref 30.0–36.0)
MCV: 81.9 fL (ref 78.0–100.0)
Platelets: 201 10*3/uL (ref 150–400)
RBC: 4.75 MIL/uL (ref 3.87–5.11)
RDW: 15.5 % (ref 11.5–15.5)

## 2010-06-01 LAB — PROTIME-INR: Prothrombin Time: 13.1 seconds (ref 11.6–15.2)

## 2010-06-09 LAB — POCT URINALYSIS DIP (DEVICE)
Bilirubin Urine: NEGATIVE
Ketones, ur: NEGATIVE mg/dL
pH: 7 (ref 5.0–8.0)

## 2010-06-25 ENCOUNTER — Other Ambulatory Visit: Payer: Self-pay | Admitting: *Deleted

## 2010-06-25 MED ORDER — AMLODIPINE BESYLATE 5 MG PO TABS: 5.0000 mg | ORAL_TABLET | Freq: Every day | ORAL | Status: DC

## 2010-07-08 NOTE — Assessment & Plan Note (Signed)
OFFICE VISIT   [name], [name]  DOB:  [birth year]                                        July 24, 2009  CHART #:  [id number]   The patient comes today and she is still having some mild  postthoracotomy knee pain and also complains of some of lesions on her  hands.  Chest x-ray is stable.  Her blood pressure is 141/76, pulse 65,  respirations 18, sats were 97%.  She is still having again some mild  postthoracotomy pain.  I told her that is probably going to remain, but  from the standpoint of her x-ray being stable and since this was a  benign process, I will refer her back to her medical doctors, Dr.  Posey Rea and Dr. Sherene Sires for long-term followup.   [name], M.D.  Electronically Signed   DPB/MEDQ  D:  07/24/2009  T:  07/25/2009  Job:  [id number]

## 2010-07-08 NOTE — Assessment & Plan Note (Signed)
OFFICE VISIT   Courtney Day, Courtney Day  DOB:  09-Jun-1934                                        November 20, 2008  CHART #:  [id number]   The patient came today.  Her blood pressure is 150/79, pulse 60,  respirations 18, sats were 95%.  Her wounds are well healed.  She is  doing well overall.  She has been referred for Alzheimer's evaluation.  Her chest x-ray showed no evidence of any change.  I will see her back  again in 2 months with a chest x-ray for final check.   [name], M.D.  Electronically Signed   DPB/MEDQ  D:  11/20/2008  T:  11/21/2008  Job:  [id number]

## 2010-07-08 NOTE — Letter (Signed)
October 17, 2008   Casimiro Needle B. Sherene Sires, MD, FCCP  [address]  [place], [place] [address]   Re:  Courtney Day, Courtney Day             DOB:  Jul 08, 1934   Dear Dr. Sherene Sires:   I saw the patient back in the office today and we have planned to do her  surgery this Friday.  She is doing well overall.  Her mediastinoscopy  incision is well healed.  I have discussed the surgery which will be a  right lower lobe superior segmentectomy with her and her husband, and  they agree with the surgery.  They will inform me when she is admitted  to the hospital.   Sincerely,   [name], M.D.  Electronically Signed   DPB/MEDQ  D:  10/17/2008  T:  10/17/2008  Job:  [id number]

## 2010-07-08 NOTE — Letter (Signed)
October 30, 2008   Charlaine Dalton. Sherene Sires, MD, FCCP  [address]  [place] [address]   Re:  Courtney Day, Courtney Day             DOB:  [birth year]   Dear Kathlene November,   I saw the patient back to office today.  Her incision is well healed.  Today, we removed her chest tube sutures.  Her blood pressure was  125/73, pulse 70, respirations 18, and sats were 96%.  I will see her  again in 3 weeks with a chest x-ray.  I have referred her to Neurology  for workup for possibly early Alzheimer's at the request of her family.   Sincerely,   [name], M.D.  Electronically Signed   DPB/MEDQ  D:  10/30/2008  T:  10/30/2008  Job:  [id number]

## 2010-07-08 NOTE — Assessment & Plan Note (Signed)
OFFICE VISIT   Courtney Day, Courtney Day  DOB:  31-Oct-1934                                        January 02, 2009  CHART #:  [id number]   The patient came today.  Her blood pressure is 140/80, pulse 60,  respirations 18, sats were 98%.  LUNGS:  Clear to auscultation and  percussion.  Chest x-ray showed normal postoperative changes.  She is  doing well overall, and I release him to full activity.  I will see her  again if she has any future problems.   [name], M.D.  Electronically Signed   DPB/MEDQ  D:  01/02/2009  T:  01/03/2009  Job:  [id number]

## 2010-07-08 NOTE — Discharge Summary (Signed)
Courtney Day, Courtney Day                   ACCOUNT NO.:  [id number]   MEDICAL RECORD NO.:  [id number]          PATIENT TYPE:  INP   LOCATION:  3301                         FACILITY:  [facility]   PHYSICIAN:  Courtney Day, M.D. DATE OF BIRTH:  1934/07/05   DATE OF ADMISSION:  10/19/2008  DATE OF DISCHARGE:                               DISCHARGE SUMMARY   PRIMARY ADMITTING DIAGNOSIS:  Right lower lobe lung lesion.   ADDITIONAL/DISCHARGE DIAGNOSES:  1. Asymptomatic Histoplasma, right lower lobe with reactive      adenopathy.  2. Hypertension.  3. Osteoarthritis.  4. Knee pain.  5. History of low back pain.  6. History of depression.   PROCEDURES PERFORMED:  Right video-assisted thoracoscopic surgery and  right mini thoracotomy with right lower lobe superior segmentectomy.   HISTORY:  The patient is a 76 year old female who recently presented  with a cough.  In the course of her workup, she had a chest x-ray which  showed a right lower lobe lesion.  A CT scan confirmed two nodules on  the right lower lobe.  There was also mediastinal adenopathy.  She  underwent a PET scan which showed increased uptake in the 4R and 10R  lymph node areas.  She was referred to Dr. Edwyna Shell and underwent a  bronchoscopy with mediastinoscopy.  The bronchoscopy showed no evidence  of malignant cells.  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  Upon  reevaluation, Dr. Edwyna Shell felt that she would benefit from right lower  lobe superior segmentectomy for diagnosis at this time.  He explained  all risks, benefits and alternatives of surgery to the patient and she  agreed to proceed.   HOSPITAL COURSE:  She was admitted to [facility] on October 19, 2008 and underwent a right lower lobe superior segmentectomy by Dr.  Edwyna Shell.  Please see previously dictated operative report for complete  details of surgery.  She tolerated the procedure well and  was  transferred to the SICU in stable condition.  She initially had some  postoperative confusion and remained in the unit for an additional  period of observation.  She was treated conservatively with minimization  of narcotics and her confusion is improving.  She was able to be  transferred to the Step-Down Unit on October 22, 2008.  Her pathology was  positive for asymptomatic histoplasmosis.  She was seen by Infectious  Disease and it was felt that these were old granulomas with nonviable  organisms and no antifungal therapy was recommended that this time.  Her  postoperative course has otherwise gone well.  Her chest tubes have been  removed in the usual fashion and her current chest x-ray shows no  evidence of pneumothorax.  Her most recent labs show sodium 137,  potassium 4.0, BUN 12, and creatinine 0.72.  White count 12.2,  hemoglobin 10.9, hematocrit 32.9, and platelets 171.  She presently is  ambulating in the halls without problem and is tolerating a regular  diet.  She will be reevaluated on morning rounds on October 24, 2008 as  well as have a followup PA and lateral chest x-ray.  It is anticipated  that if she is doing well and her x-ray showed no change, she will be  ready for discharge home.   DISCHARGE MEDICATIONS:  1. Darvocet-N 100 one to three q.4-6 h. p.r.n. for pain.  2. Amlodipine 5 mg daily.  3. Flax seed oil 1000 mg daily.  4. Glucosamine chondroitin daily.  5. Hydrochlorothiazide 12.5 mg daily.  6. Vitamin D 1000 units daily.   DISCHARGE INSTRUCTIONS:  She is asked to refrain from driving, heavy  lifting, or strenuous activity.  She may continue ambulating daily and  using her incentive spirometer.  She may shower daily and clean her  incisions with soap and water.  She will continue her same preoperative  diet.   DISCHARGE FOLLOWUP:  She will see Dr. Edwyna Shell in the office in 1 week  with a chest x-ray.  In the interim, she is asked to contact our office   immediately if she experiences any problems or has questions.      [name], P.A.      Courtney Day, M.D.  Electronically Signed    GC/MEDQ  D:  10/23/2008  T:  10/24/2008  Job:  [id number]   cc:   Charlaine Dalton. Sherene Sires, MD, FCCP

## 2010-07-08 NOTE — Letter (Signed)
September 12, 2008   Casimiro Needle B. Sherene Sires, MD, FCCP  [address]  [place], [place] [address]   Re:  Courtney Day, Courtney Day             DOB:  01-11-1935   Dear Kathlene November,   I appreciate the opportunity of seeing this complicated patient. The  patient is a 76 year old African American female, who has had a cough  and a chest x-ray that showed a right lower lobe lesion.  CT scan that  showed two nodules in the right lower lobe and then PET scan which  showed that the nodule was cold, but there were multiple node uptakes in  the 4R and 10R area.  She is referred here for evaluation.  She is a  nonsmoker.  She has had a cough but that resolved with the  discontinuation of ACE inhibitors.  She has had no hemoptysis, fever, or  chills.  Her medication include Darvocet for pain, lorazepam 0.5 twice a  day, amlodipine 5 mg a day for hypertension, hydrochlorothiazide 12.5 mg  a day for hypertension.  No allergies.  She is on Exforge, Diovan, and  ACE inhibitors.   PAST MEDICAL HISTORY:  She has been treated for hypertension, low back  pain, osteoarthritis, asthma, knee pain, and depression.   FAMILY HISTORY:  Noncontributory.   SOCIAL HISTORY:  She is married, has no children.  She is retired, came  in today with her husband works as an Armed forces operational officer.  She does not  smoke or drink alcohol.   REVIEW OF SYSTEMS:  She is 151 pounds.  She is 5 feet 2.  Her weight has  been stable.  Cardiac:  She has shortness of breath with exertion.  Pulmonary:  See history of present illness.  GI:  She got reflux.  GU:  Frequent urination.  No kidney disease.  Vascular:  No claudication,  DVT, or TIAs.  Neurological:  No dizziness, headaches, blackouts, or  seizures.  Musculoskeletal:  See past medical history.  She got  arthritis and joint pain.  Psychiatric:  See past medical history.  Eyes/ENT:  No recent change in her hearing, but has a decrease in her  eyesight.  Hematologic:  She has been treated for anemia in the  past.  No clotting or bleeding problems.   PHYSICAL EXAMINATION:  GENERAL:  She is a well-developed [place]  American female in no acute distress.  VITAL SIGNS:  Her blood pressure is 151/79, pulse 63, respirations 18,  and saturations were 97%.  HEAD, EYES, EARS, NOSE, AND THROAT:  Unremarkable.  NECK:  Supple without thyromegaly.  There is no supraclavicular or  axillary adenopathy.  CHEST:  Clear to auscultation and percussion.  HEART:  Regular sinus rhythm.  No murmurs.  ABDOMEN:  Soft.  There is no hepatosplenomegaly.  EXTREMITIES:  Pulses are 2+.  There is no clubbing or edema.  NEUROLOGIC:  She is oriented x3.  Sensory and motor intact.  Cranial  nerves are intact.   I feel that this is very instant situation, I think we will proceed with  a bronchoscopy, mediastinoscopy, and endobronchial ultrasound; and  depending upon our findings there, we will decide whether she needs to  have a VATS removal of these lung nodules or not.  I appreciate the  opportunity of seeing the patient.   Sincerely,   [name], M.D.  Electronically Signed   DPB/MEDQ  D:  09/12/2008  T:  09/13/2008  Job:  [id number]  cc:   Georgina Quint. Plotnikov, MD

## 2010-07-08 NOTE — H&P (Signed)
Courtney Day, Courtney Day                   ACCOUNT NO.:  [id number]   MEDICAL RECORD NO.:  [id number]          PATIENT TYPE:  INP   LOCATION:  NA                           FACILITY:  [facility]   PHYSICIAN:  [name], M.D. DATE OF BIRTH:  [birth year]   DATE OF ADMISSION:  DATE OF DISCHARGE:                              HISTORY & PHYSICAL   CHIEF COMPLAINT:  Right lung mass.   HISTORY OF PRESENT ILLNESS:  This 76 year old Afro-American female  presented with right lower lobe mass and mediastinal adenopathy that  were both positive on PET scan.  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  The PET  scan showed that the right lower lobe node was negative, which would  lean toward a benign tumor, but it is 2.2 cm in diameter.  She has had  no fever, chills, excessive sputum.  She has a cough that is improved  with stopping ACE inhibitors.  She is a nonsmoker.   PAST MEDICAL HISTORY:  She has hypertension, low back pain,  osteoarthritis, asthma, knee pain and depression.   She is allergic to [facility], DIOVAN and ACE INHIBITORS.   MEDICATIONS:  1. Darvocet for pain.  2. Lorazepam 0.2 twice a day.  3. Amlodipine 5 mg daily for hypertension.  4. Hydrochlorothiazide 12.5 daily for hypertension.   FAMILY HISTORY:  Noncontributory.   SOCIAL HISTORY:  She is married, has no children.  She is retired.  Came  in today.  Her husband works in Audiological scientist.  She does not drink.  She  has not intake alcohol or smoke.   REVIEW OF SYSTEMS:  She is 51 pounds.  She is 5 feet 2 inches.  Her  weight has been stable.  CARDIAC:  She gets shortness of breath with  exertion.  PULMONARY:  See history of present illness.  GI:  She has  reflux.  GU:  She has frequent urination.  No kidney disease.  VASCULAR:  No claudication, DVT, TIAs.  NEUROLOGICAL:  No dizziness, headaches,  blackout or  seizures.  MUSCULOSKELETAL:  She has arthritis.  See past  medical history.  PSYCHIATRIC:  See past medical history.  EYE/ENT:  No  change in eyesight or hearing, but she states she has some decrease in  eyesight.  HEMATOLOGIC:  Has been treated for anemia in the past.  No  bleeding or clotting disorders.   PHYSICAL EXAMINATION:  She is a well-developed [place] American female  in no acute distress.  Her blood pressure is 151/79, pulse 63, respirations 18, saturations  were 97%.  Head is atraumatic.  Eyes:  Pupils equal, react to light and  accommodation.  Extraocular movements are normal.  Ears:  Tympanic  membranes are intact.  Nose:  There is no septal deviation.  Throat is  without lesion.  Uvula is in the midline.  NECK:  Supple without thyromegaly.  There is no supraclavicular or axillary adenopathy.  CHEST:  Clear to auscultation and percussion.  HEART:  Regular sinus rhythm.  No murmurs.  ABDOMEN:  Soft.  There is no splenomegaly.  EXTREMITIES:  Pulses are 2+.  There is no clubbing or edema.  NEUROLOGIC:  She is oriented x3.  Sensory and motor are intact.  Cranial  nerves are intact.  SKIN:  She has several areas on her hands primarily of decreased  pigmentation that she says came on her hands after blood loss.   IMPRESSION:  1. Right lower lobe nodule.  2. Histoplasmosis, mediastinal adenopathy.  3. Hypertension.  4. Asthma.  5. Chronic knee pain and osteoarthritis.  6. History of depression.   PLAN:  Right VATS, right lower lobe superior segmentectomy.      [name], M.D.  Electronically Signed     DPB/MEDQ  D:  10/17/2008  T:  10/17/2008  Job:  [id number]

## 2010-07-08 NOTE — Op Note (Signed)
NAMEEREKA, Courtney Day                   ACCOUNT NO.:  [id number]   MEDICAL RECORD NO.:  [id number]          PATIENT TYPE:  AMB   LOCATION:  SDS                          FACILITY:  [facility]   PHYSICIAN:  Leslye Peer, MD    DATE OF BIRTH:  [birth year]   DATE OF PROCEDURE:  09/21/2008  DATE OF DISCHARGE:                               OPERATIVE REPORT   PROCEDURE:  Fiberoptic bronchoscopy with endobronchial ultrasound.   OPERATORS:  Leslye Peer, MD  [name], MD   INDICATION:  Right lower lobe mass and right-sided mediastinal  lymphadenopathy.   MEDICATIONS GIVEN:  The procedure was done under general anesthesia in  the operating room.  An informed consent was obtained from the patient.  The signed copy is on her hospital chart.   PROCEDURE DETAILS:  After an informed consent was obtained a formal time-  out was performed and then general anesthesia was initiated.  The  fiberoptic bronchoscope with endobronchial ultrasound was introduced  through the endotracheal tube and Wang needle biopsies were performed  under direct ultrasound guidance from station 10R and 4R lymph nodes, to  be sent for cytology.  Inspection of airways showed no endobronchial  lesions or abnormal secretions.  The endobronchial ultrasound was  withdrawn and a standard bronchoscope was introduced and brushings were  performed in the right lower lobe under fluoroscopic guidance, these  will be sent for cytology.  The patient tolerated the procedure well.  There were no obvious complications and there was no significant blood  loss.   SAMPLES:  1. Wang needle biopsy from station 4R lymph node.  2. Wang needle biopsy from station 10R lymph node.  3. Right lower lobe transbronchial brushings.   PLANS:  Ms. Brendlinger will undergo mediastinoscopy by Dr. Edwyna Shell to gather  further tissue.  This will be evaluated for possible malignancy in  combination with samples obtained above.  Follow up will be with Dr.  Edwyna Shell.      Leslye Peer, MD  Electronically Signed     RSB/MEDQ  D:  09/21/2008  T:  09/21/2008  Job:  [id number]

## 2010-07-08 NOTE — Op Note (Signed)
Courtney Day, Courtney Day                   ACCOUNT NO.:  [id number]   MEDICAL RECORD NO.:  [id number]          PATIENT TYPE:  INP   LOCATION:  2309                         FACILITY:  [facility]   PHYSICIAN:  [name], M.D. DATE OF BIRTH:  04/19/1934   DATE OF PROCEDURE:  10/19/2008  DATE OF DISCHARGE:                               OPERATIVE REPORT   PREOPERATIVE DIAGNOSIS:  Right lower lobe  lesion, history of  histoplasmosis.   POSTOPERATIVE DIAGNOSES:  Right lower lobe  lesion, history of  histoplasmosis.   OPERATION PERFORMED:  Right VATS, mini thoracotomy, and resection of  right lower lobe superior segment.   SURGEON:  [name], MD   General anesthesia.   After percutaneous insertion of all monitoring lines, the patient was  turned to the right lateral thoracotomy position.  She was prepped and  draped in the usual sterile manner.  Dual-lumen tube was inserted, the  right lung was deflated.  This patient had presented with a right lower  lobe mass and mediastinal adenopathy.  PET scan was positive in the  mediastinal adenopathy with the right lower lobe.  Mediastinoscopy  revealed necrotizing granulomatous disease with stains consistent with  possible histoplasmosis.  Apparently, cultures are pending but have not  been reported yet.  Because the right lower lobe was not positive on  PET, it was decided to remove this lesion just to make sure this is not  a low gradecancer.  After general anesthesia, the patient was turned in  the right lateral thoracotomy position.  He was prepped and draped in  the usual sterile manner.  Two trocar sites were made in the anterior  and posterior axillary line at seventh intercostal space.  Two trocars  were inserted.  The lesion could be seen in the superior segment of the  right lower lobe.  A 5-cm incision was made over the triangle  auscultation partially dividing the latissimus and entering the triangle  auscultation at the fifth  intercostal space.  A small TPA was placed in  the interspace.  We dissected down to the pulmonary artery in the  superior portion of the major fissure which was almost completely  divided with electrocautery.  This exposed right lower lobe superior  segmental branch.  This was doubly clipped and divided.  Then we decided  to remove the rest of the superior segment using the Covidien purple  stapler and __________ so that we could get both the lesion as well as  the  __________ with 3 applications.  There was an area of bleeding on  the staple line which was overdrilled with 3-0 Vicryl.  CoSeal was then  applied to the stapler in the bronchus.  A single chest tube was  inserted anteriorly through the anterior trocar site and sutured in  place with 0 silk.  Through the posterior trocar site was inserted a  single On-Q in the usual fashion.  A Marcaine  block was done in the usual fashion.  The chest was closed with 2  __________ sixth rib and passed around the  fifth rib, #1 Vicryl in the  muscle layer, 2-0 Vicryl in the subcutaneous tissue, and Dermabond for  the skin.  The patient was returned to the recovery room in stable  condition.      [name], M.D.  Electronically Signed     DPB/MEDQ  D:  10/19/2008  T:  10/20/2008  Job:  [id number]   cc:   Charlaine Dalton. Sherene Sires, MD, FCCP

## 2010-07-08 NOTE — Assessment & Plan Note (Signed)
OFFICE VISIT   Courtney Day, LOUD  DOB:  Sep 24, 1934                                        March 27, 2009  CHART #:  [id number]   The patient came today.  Her chest x-ray is stable.  There is no  evidence of recurrence of her granulomatous disease.  She is still  having some mild right postthoracotomy pain, and we started her on  Neurontin 600 mg at night to take for this.  They called during the day,  we want to go real slow, because she did have some confusion from  narcotics while in the hospital.  Her blood pressure was 186/85, pulse  65, respirations 18, sats were 97%.   [name], M.D.  Electronically Signed   DPB/MEDQ  D:  03/27/2009  T:  03/28/2009  Job:  [id number]   cc:   Thora Lance, M.D.  Artist Pais, M.D.

## 2010-07-08 NOTE — Op Note (Signed)
Courtney Day, Courtney Day                   ACCOUNT NO.:  [id number]   MEDICAL RECORD NO.:  [id number]          PATIENT TYPE:  AMB   LOCATION:  SDS                          FACILITY:  [facility]   PHYSICIAN:  [name], M.D. DATE OF BIRTH:  10/25/1934   DATE OF PROCEDURE:  09/21/2008  DATE OF DISCHARGE:                               OPERATIVE REPORT   PREOPERATIVE DIAGNOSIS:  Right lower lobe mass.   POSTOPERATIVE DIAGNOSIS:  Right lower lobe mass.   OPERATION PERFORMED:  Mediastinoscopy.   SURGEON:  [name], MD   ANESTHESIA:  General anesthesia.   This patient had an EBUS and bronchoscopy dictated by Dr. Delton Coombes and then  we prepped and draped the anterior chest and the patient had undergone  general anesthesia.  A transverse incision was made at the sternal notch  and carried down with electrocautery.  The subcutaneous tissue of the  pretracheal fascia was entered and biopsies of four x2 were done.  The  mediastina scope was removed.  Strap muscles were closed with 2-0  Vicryl, subcutaneous tissue with 3-0 Vicryl, and Dermabond for the skin.  The patient tolerated the procedure well and was returned to recovery  room in stable condition.      [name], M.D.  Electronically Signed     DPB/MEDQ  D:  09/21/2008  T:  09/21/2008  Job:  [id number]

## 2010-07-11 NOTE — Op Note (Signed)
NAME:  Courtney Day, Courtney Day                             ACCOUNT NO.:  [id number]   MEDICAL RECORD NO.:  [id number]                   PATIENT TYPE:  AMB   LOCATION:  DSC                                  FACILITY:  [facility]   PHYSICIAN:  Vikki Ports, M.D.         DATE OF BIRTH:  October 19, 1934   DATE OF PROCEDURE:  04/25/2003  DATE OF DISCHARGE:                                 OPERATIVE REPORT   PREOPERATIVE DIAGNOSIS:  Left breast mass x 2.   POSTOPERATIVE DIAGNOSIS:  Left breast mass x 2.   PROCEDURE:  Excisional left breast biopsy x 2.   ANESTHESIA:  Local MAC.   SURGEON:  Vikki Ports, M.D.   DESCRIPTION OF PROCEDURE:  The patient was taken to the operating room and  placed in a supine position.  After adequate MAC was induced, the left  breast was prepped and draped in a normal sterile fashion.  The periareolar  skin and subcutaneous tissue overlying the masses at 9 o'clock and 6 o'clock  were anesthetized.  A curvilinear incision was made, masses which were very  superficial and contained a milky white material were both excised in their  entirety.  Adequate hemostasis of the cavities was insured and the skin was  closed with subcuticular 4-0 Monocryl.  Steri-Strips and sterile dressings  were applied.  The patient tolerated the procedure well and went to the PACU  in good condition.                                               Vikki Ports, M.D.    KRH/MEDQ  D:  04/25/2003  T:  04/25/2003  Job:  [id number]

## 2010-07-11 NOTE — Letter (Signed)
September 26, 2008   Charlaine Dalton. Sherene Sires, MD, FCCP  [address]  [place], [place] [address]   Re:  Courtney Day, Courtney Day             DOB:  01-31-1935   Dear Kathlene November,   I saw the patient back today with her husband after a mediastinoscopy.  Her bronchoscopy showed no evidence of malignant cells, although we  really not identified the right lower lobe lesion.  This lesion is 2-1/2  cm in the right lower lobe, is new since chest x-ray of 2004.  It was  negative on PET as you discussed.  Her mediastinoscopy and lymph nodes  showed necrotizing granulomatous disease and the special stains appeared  to be histoplasmosis so, she does have histoplasmosis in her  mediastinum, but I think this is separate from the right lower lobe  lesion.  Even though it is negative on PET, given that it is a new  lesion and has increased in its size, I feel that it needs to be  resected with a VATS resection of right lower lobe superior  segmentectomy.  I have discussed this with her and her husband, and they  will think about this.  Her blood pressure is 158/78, pulse 66,  respirations 18, and sats were 95%.  Lungs were clear to auscultation  and percussion.  Her incisions are well healed.  I will see her back  again in 3 weeks to follow up on her mediastinoscopy incision and  hopefully, we can schedule the surgery in the future.   Sincerely,   [name], M.D.  Electronically Signed   DPB/MEDQ  D:  09/26/2008  T:  09/26/2008  Job:  [id number]   cc:   Georgina Quint. Plotnikov, MD

## 2010-08-28 ENCOUNTER — Ambulatory Visit: Payer: Self-pay | Admitting: Internal Medicine

## 2010-09-01 ENCOUNTER — Ambulatory Visit (INDEPENDENT_AMBULATORY_CARE_PROVIDER_SITE_OTHER): Payer: MEDICARE | Admitting: Internal Medicine

## 2010-09-01 ENCOUNTER — Encounter: Payer: Self-pay | Admitting: Internal Medicine

## 2010-09-01 DIAGNOSIS — R0789 Other chest pain: Secondary | ICD-10-CM | POA: Insufficient documentation

## 2010-09-01 DIAGNOSIS — F068 Other specified mental disorders due to known physiological condition: Secondary | ICD-10-CM

## 2010-09-01 DIAGNOSIS — R071 Chest pain on breathing: Secondary | ICD-10-CM

## 2010-09-01 DIAGNOSIS — I10 Essential (primary) hypertension: Secondary | ICD-10-CM

## 2010-09-01 MED ORDER — DICLOFENAC SODIUM 1 % TD GEL: 1.0000 "application " | Freq: Four times a day (QID) | TRANSDERMAL | Status: DC

## 2010-09-01 MED ORDER — GABAPENTIN 100 MG PO CAPS: 100.0000 mg | ORAL_CAPSULE | Freq: Three times a day (TID) | ORAL | Status: DC | PRN

## 2010-09-01 NOTE — Assessment & Plan Note (Addendum)
Will try ice Voltaren Gaba 100 bid to try Lido/Depo inj is an option too

## 2010-09-01 NOTE — Progress Notes (Signed)
  Subjective:    Patient ID: Courtney Day, female    DOB: 07-Mar-1934, 76 y.o.   MRN: [id number]  HPI  C/o R chest wall pain post-VATS in 2010 by Dr Harvel Quale. She had a mammo 2 wks ago. Pain is 3/10 She had a CXR at [facility] - ok  Review of Systems  Constitutional: Negative for chills, activity change, appetite change, fatigue and unexpected weight change.  HENT: Negative for congestion, mouth sores and sinus pressure.   Eyes: Negative for visual disturbance.  Respiratory: Negative for cough and chest tightness.   Cardiovascular: Positive for chest pain (R).  Gastrointestinal: Negative for nausea and abdominal pain.  Genitourinary: Negative for frequency, difficulty urinating and vaginal pain.  Musculoskeletal: Negative for back pain and gait problem.  Skin: Negative for pallor and rash.  Neurological: Negative for dizziness, tremors, weakness, numbness and headaches.  Psychiatric/Behavioral: Negative for confusion and sleep disturbance.       Objective:   Physical Exam  Constitutional: She appears well-developed and well-nourished. No distress.  HENT:  Head: Normocephalic.  Right Ear: External ear normal.  Left Ear: External ear normal.  Nose: Nose normal.  Mouth/Throat: Oropharynx is clear and moist.  Eyes: Conjunctivae are normal. Pupils are equal, round, and reactive to light. Right eye exhibits no discharge. Left eye exhibits no discharge.  Neck: Normal range of motion. Neck supple. No JVD present. No tracheal deviation present. No thyromegaly present.  Cardiovascular: Normal rate, regular rhythm and normal heart sounds.   Pulmonary/Chest: No stridor. No respiratory distress. She has no wheezes.  Abdominal: Soft. Bowel sounds are normal. She exhibits no distension and no mass. There is no tenderness. There is no rebound and no guarding.  Musculoskeletal: She exhibits no edema and no tenderness.  Lymphadenopathy:    She has no cervical adenopathy.  Neurological: She displays normal  reflexes. No cranial nerve deficit. She exhibits normal muscle tone. Coordination normal.  Skin: No rash noted. No erythema.  Psychiatric: She has a normal mood and affect. Her behavior is normal. Judgment and thought content normal.   Tender over a scar over R posterolateral chest wall on palpation       Assessment & Plan:

## 2010-09-01 NOTE — Assessment & Plan Note (Signed)
On Rx 

## 2010-10-22 IMAGING — CR DG CHEST 2V
2 series · 2 of 2 positions shown · non-contrast
Comparison: 09/18/2008 chest x-ray and PET CT 05/28/2008.

CLINICAL DATA: Preop for lung lesion removal.

CHEST - 2 VIEW

[view not recorded (1 of 2)]
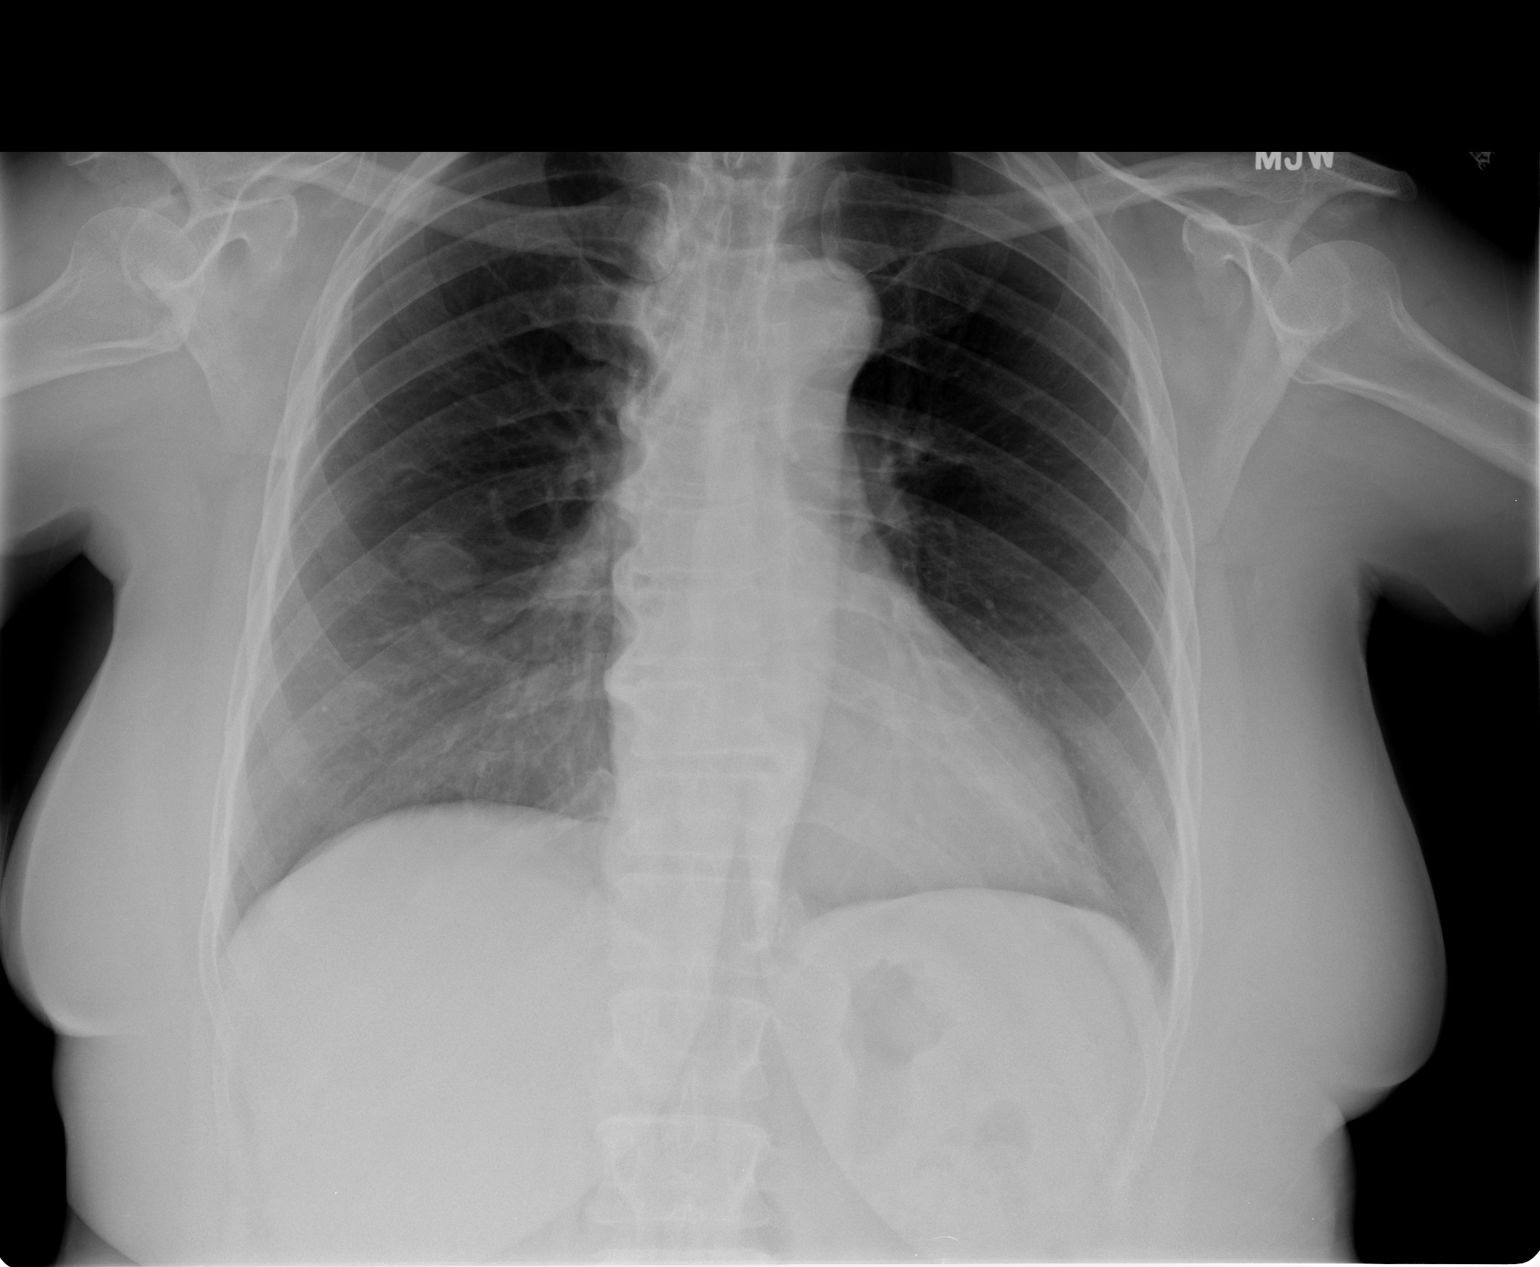

[view not recorded (2 of 2)]
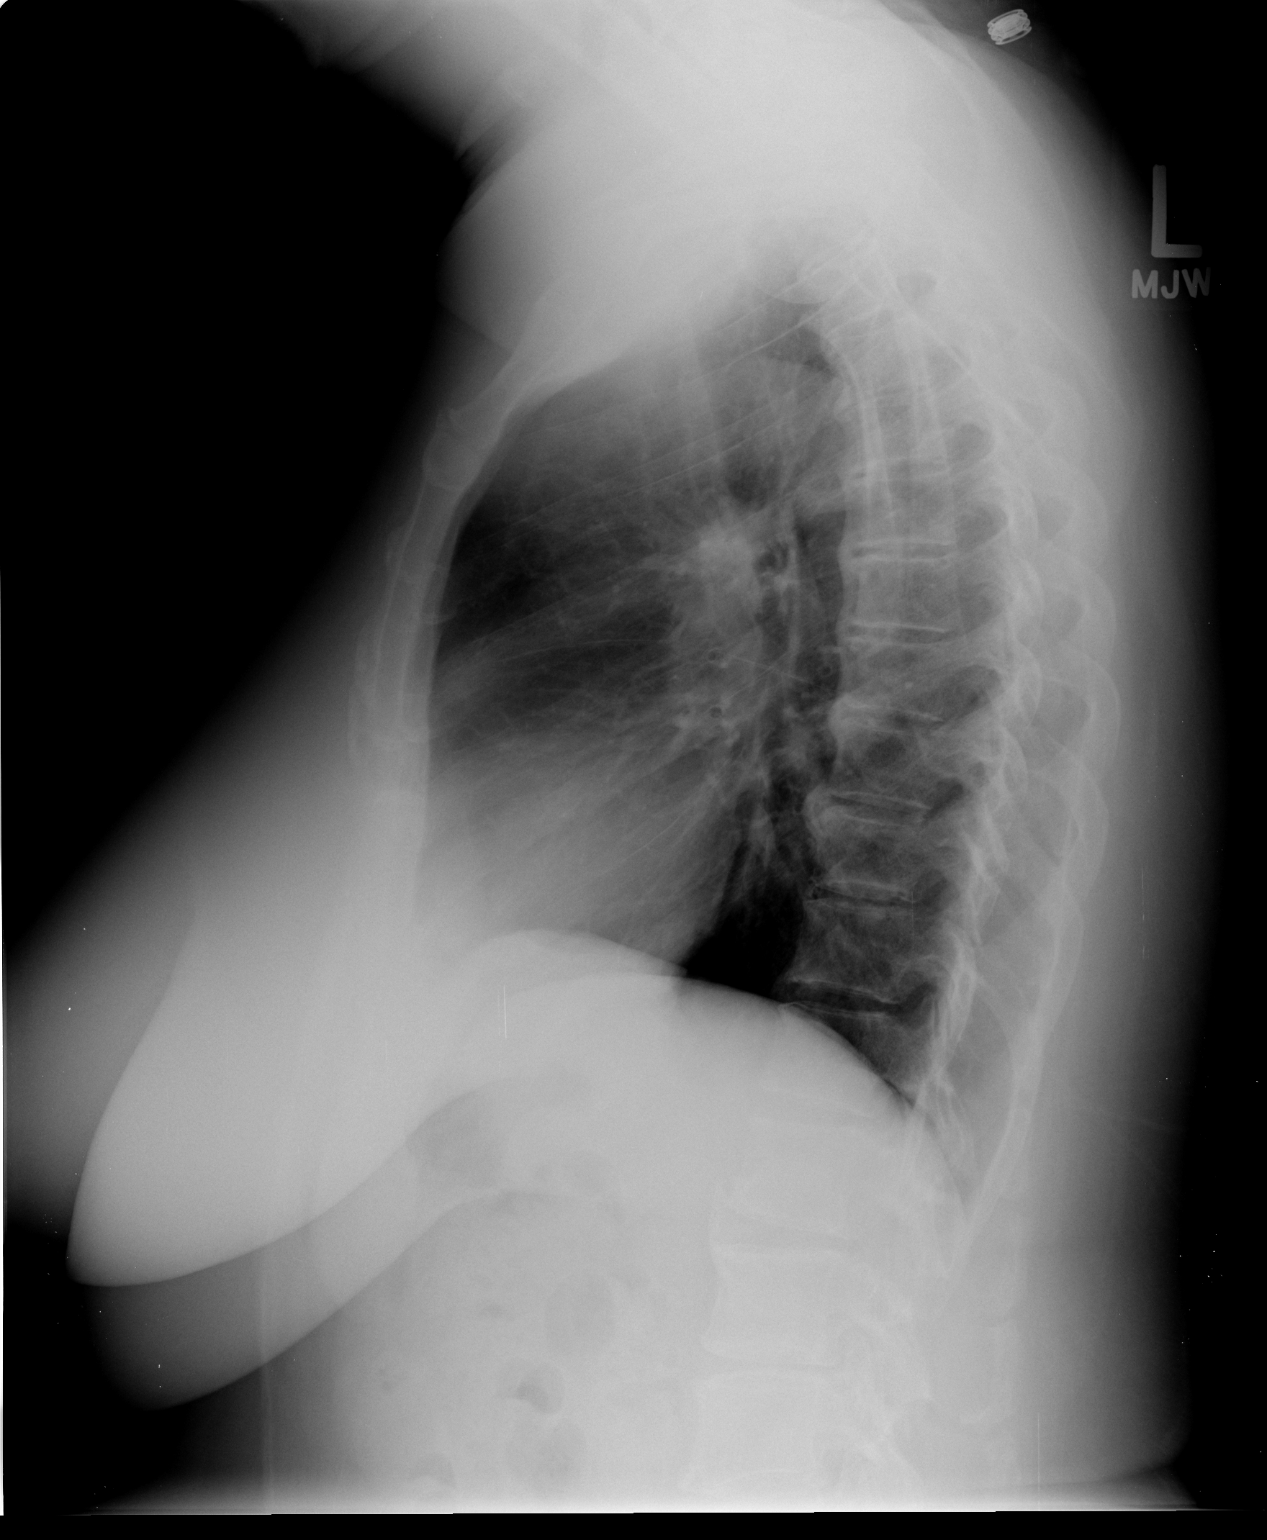

[2 of 2 positions shown; findings below may reference images not displayed]

FINDINGS: No change in the superior segment right lower lobe lobe
lung nodule.  The cardiac silhouette, mediastinal and hilar
contours are stable.  No acute pulmonary findings.  No pleural
effusion.  The bony thorax is intact.
IMPRESSION: 1.  Stable right lower lobe pulmonary nodule.
2.  No acute pulmonary findings.

## 2010-10-24 IMAGING — CR DG CHEST 1V PORT
1 series · 1 of 1 positions shown · non-contrast
Comparison: Two-view chest x-ray 10/17/2008.

CLINICAL DATA: Postop superior segment right lower lobectomy.

PORTABLE CHEST - 1 VIEW [DATE]/9121 2290 hours:

[AP]
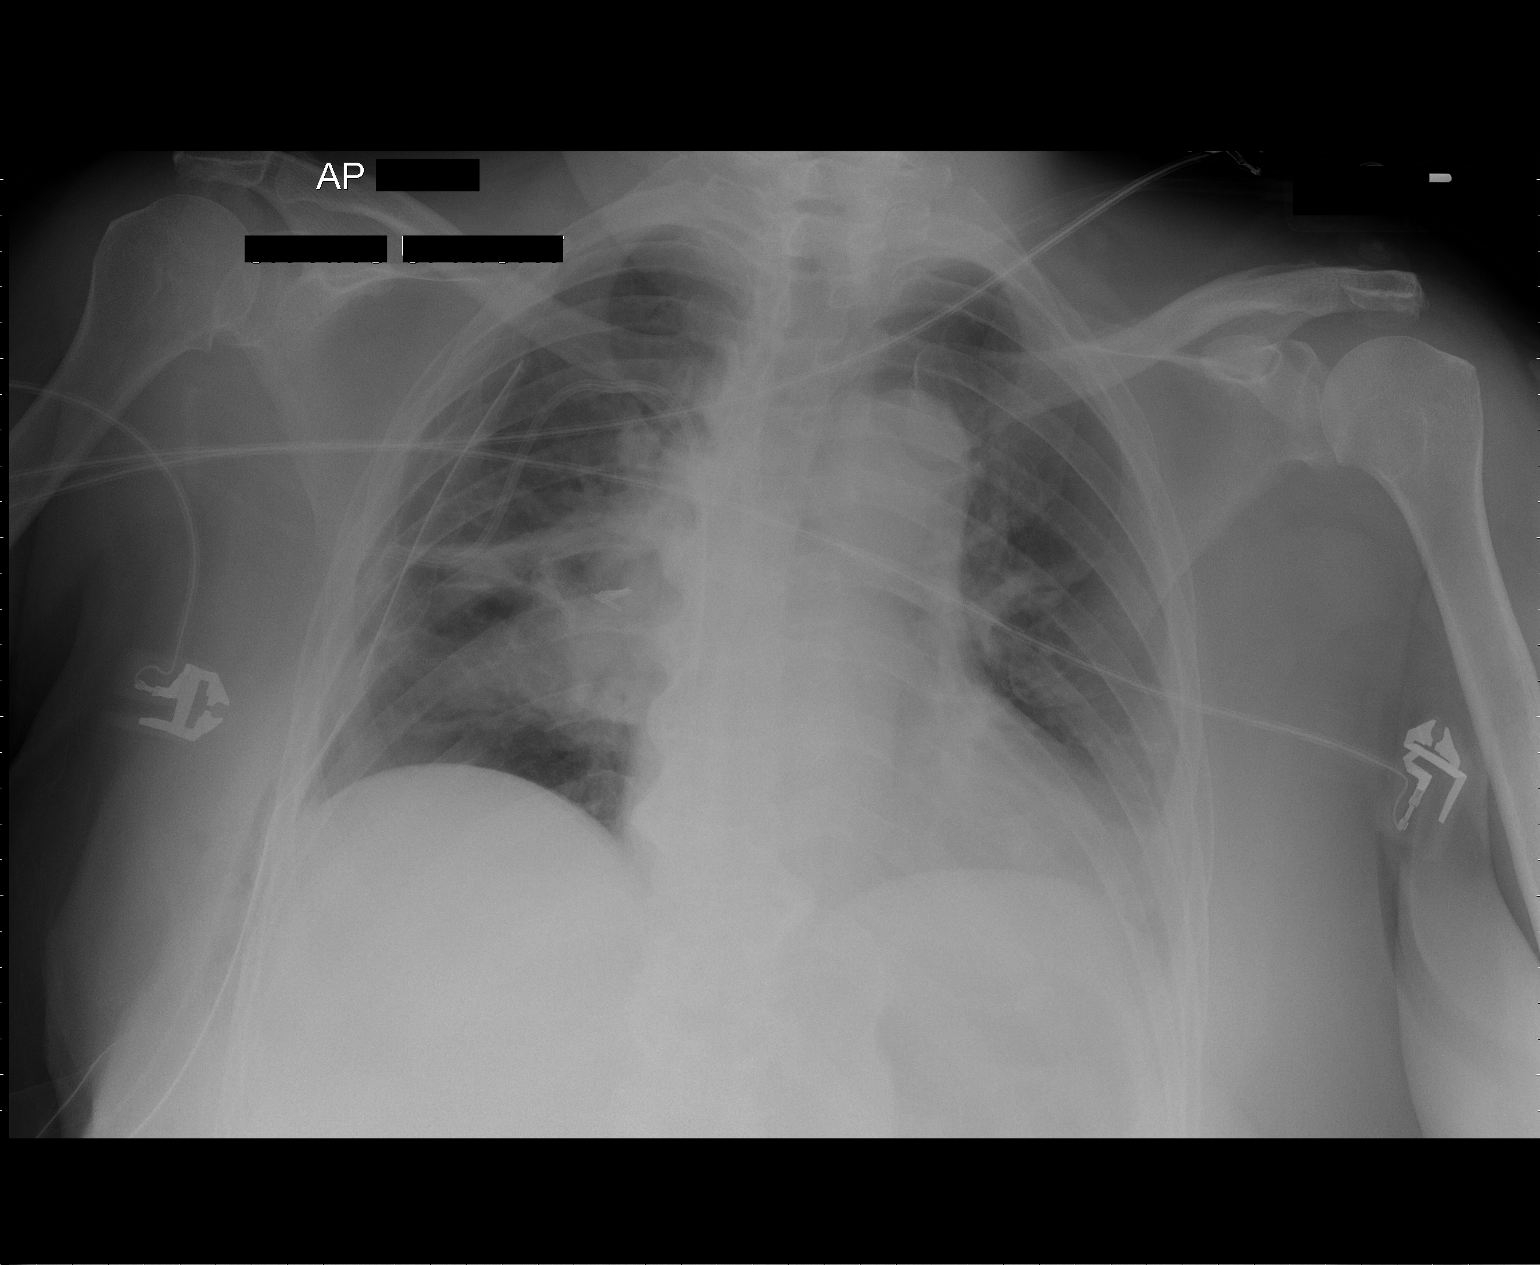

[1 of 1 positions shown; findings below may reference images not displayed]

FINDINGS: Right chest tube in place with no pneumothorax.  Post-
surgical atelectasis and probable contusion/hemorrhage in the right
mid lung.  Mild linear atelectasis at the left lung base.  Lungs
otherwise clear.  Heart size upper normal for technique and degree
of inspiration.  Right subclavian central venous catheter tip in
the SVC.
IMPRESSION: Support apparatus satisfactory.  No pneumothorax.  Expected post-
operative atelectasis and probable contusion/hemorrhage in the
right mid lung.  Minimal left basilar atelectasis.

## 2010-12-05 ENCOUNTER — Ambulatory Visit: Payer: MEDICARE | Admitting: Internal Medicine

## 2010-12-22 ENCOUNTER — Encounter: Payer: Self-pay | Admitting: Internal Medicine

## 2010-12-22 ENCOUNTER — Ambulatory Visit (INDEPENDENT_AMBULATORY_CARE_PROVIDER_SITE_OTHER)
Admission: RE | Admit: 2010-12-22 | Discharge: 2010-12-22 | Disposition: A | Discharge status: Home / Self Care | Payer: MEDICARE | Source: Ambulatory Visit | Attending: Internal Medicine | Admitting: Internal Medicine

## 2010-12-22 ENCOUNTER — Ambulatory Visit (INDEPENDENT_AMBULATORY_CARE_PROVIDER_SITE_OTHER): Payer: MEDICARE | Admitting: Internal Medicine

## 2010-12-22 ENCOUNTER — Telehealth: Payer: Self-pay | Admitting: Internal Medicine

## 2010-12-22 VITALS — BP 136/60 | HR 55 | Temp 97.9°F | Ht 62.0 in | Wt 153.8 lb

## 2010-12-22 DIAGNOSIS — R05 Cough: Secondary | ICD-10-CM

## 2010-12-22 DIAGNOSIS — R0789 Other chest pain: Secondary | ICD-10-CM

## 2010-12-22 DIAGNOSIS — R071 Chest pain on breathing: Secondary | ICD-10-CM

## 2010-12-22 DIAGNOSIS — J984 Other disorders of lung: Secondary | ICD-10-CM

## 2010-12-22 DIAGNOSIS — R059 Cough, unspecified: Secondary | ICD-10-CM

## 2010-12-22 NOTE — Patient Instructions (Signed)
Try prilosec 20mg   Take 30-60 min before first meal of the day and Pepcid 20 mg one bedtime   GERD (REFLUX)  is an extremely common cause of respiratory symptoms, many times with no significant heartburn at all.    It can be treated with medication, but also with lifestyle changes including avoidance of late meals, excessive alcohol, smoking cessation, and avoid fatty foods, chocolate, peppermint, colas, red wine, and acidic juices such as orange juice.  NO MINT OR MENTHOL PRODUCTS SO NO COUGH DROPS  USE SUGARLESS CANDY INSTEAD (jolley ranchers or Stover's)  NO OIL BASED VITAMINS - use powdered substitutes.    Zostric cream 4 x daily to chest wall area to stop the burning pain - the most important dose is at bedtime but use a very small amount to start until you get used to it.  Please schedule a follow up office visit in 4 weeks, sooner if needed   Please remember to go to the  x-ray department downstairs for your tests - we will call you with the results when then are available.

## 2010-12-22 NOTE — Progress Notes (Signed)
Subjective:     Patient ID: Courtney Day, female   DOB: 1934-06-09, 76 y.o.   MRN: [id number]  HPI 72  yobf never smoker with heavy remote passive smoking exposure with URI first of 2010 with cxr suggesting RLL nodule in setting of bad chest cold that never went away, already rx with sev courses of abx.   May 22, 2008 initial pulmonary evaluation at the request of Dr. Posey Day for SPN with persistent dry cough on ACE that bothers her husband more than it does her. No wheeze or increase doe. no hemoptysis. Rec PET scan and d/c ace > micardis 40   May 30, 2008 ov cc dry cough much better but not completely gone. no hemoptysis.   Jul 12, 2008 ov >  no cough off ace, no hemoptysis.   W/u for nodule > underwent R VATS  8/04/01/08 by Courtney Day granulomas RLL no pos cultures  > ID eval no rx      12/22/2010 f/u ov/Courtney Day cc R side of chest ever since surgery assoc with burning numbness and hard to wear to bra and assoc of throat drainge worse p stir in am with minimal clear mucus     Pt denies any significant sore throat, nasal congestion or excess secretions, fever, chills, sweats, unintended wt loss, pleuritic or exertional cp, orthopnea pnd or leg swelling. Pt also denies any obvious fluctuation in symptoms with weather or environmental change or other alleviating or aggravating factors.          Allergies  1) ! * Exforge  2) ! Diovan  3) ! Ace Inhibitors      Past Medical History:  Depression  Hypertension  Low back pain  B knee pain  Courtney Day  Osteoarthritis  Allergic rhinitis  Asthma > probably all pseudoasthma  - D/C ACE May 22, 2008 > pseudowheeze resolved, cough resolved  SPN RLL  - See CT Chest 05/04/08  - PET 05/28/08 Nodule cold but nonspecific uptake in multiple nodes  - CXR September 04, 2008 slt growth and more dense  -R VATS  8/04/01/08 by Courtney Day granulomas RLL no pos cultures  > ID eval no rx      Family History:  Family History Hypertension    Family History Lung cancer in sister ( smoker)   Social History:  Retired  Married  Never Smoked  Last heavy exposure 1994    Review of Systems     Objective:   Physical Exam    amb bf nad  wt 168 > 161 May 22, 2008 > 170 May 30, 2008 > 175 Jul 12, 2008 > 171 September 04, 2008 > 12/22/2010 153  HEENT: nl dentition, turbinates, and orophanx. Nl external ear canals without cough reflex  NECK : without JVD/Nodes/TM/ nl carotid upstrokes bilaterally  LUNGS: no acc muscle use, clear to A and P bilaterally without cough on insp or exp maneuvers  CV: RRR no s3 or murmur or increase in P2, no edema  ABD: soft and nontender with nl excursion in the supine position. No bruits or organomegaly, bowel sounds nl  MS: warm without deformities, calf tenderness, cyanosis or clubbing  CXR  12/22/2010 :  Stable postoperative chest. No active cardiopulmonary process.     Assessment:         Plan:

## 2010-12-22 NOTE — Telephone Encounter (Signed)
Spoke with pt and notified that all meds that he advised her to take prilosec, pepcid and zostrix cream are otc and that is why rx was not called in. Pt states will inform spouse of this.

## 2010-12-23 NOTE — Assessment & Plan Note (Signed)
Classic post surgery neuralgia/ hyperesthesia already apparently failed nsaids and gaba pentin  Explained the dx and management with pt, agrees to trial of zostrix to take advantage of the gate theory for neuralgia pain. Pain clinic could be considered if this fails

## 2010-12-23 NOTE — Assessment & Plan Note (Signed)
The most common causes of chronic cough in immunocompetent adults include the following: upper airway cough syndrome (UACS), previously referred to as postnasal drip syndrome (PNDS), which is caused by variety of rhinosinus conditions; (2) asthma; (3) GERD; (4) chronic bronchitis from cigarette smoking or other inhaled environmental irritants; (5) nonasthmatic eosinophilic bronchitis; and (6) bronchiectasis.  These conditions, singly or in combination, have accounted for up to 94% of the causes of chronic cough in prospective studies.   Other conditions have constituted no >6% of the causes in prospective studies These have included bronchogenic carcinoma, chronic interstitial pneumonia, sarcoidosis, left ventricular failure, ACEI-induced cough, and aspiration from a condition associated with pharyngeal dysfunction.    This is most likely  Classic Upper airway cough syndrome, so named because it's frequently impossible to sort out how much is  CR/sinusitis with freq throat clearing (which can be related to primary GERD)   vs  causing  secondary (" extra esophageal")  GERD from wide swings in gastric pressure that occur with throat clearing, often  promoting self use of mint and menthol lozenges that reduce the lower esophageal sphincter tone and exacerbate the problem further in a cyclical fashion.   These are the same pts who not infrequently have failed to tolerate ace inhibitors,  dry powder inhalers or biphosphonates or report having reflux symptoms that don't respond to standard doses of PPI , and are easily confused as having aecopd or asthma flares,   For now rec max gerd rx and f/u in 4-6 weeks if not responding

## 2010-12-23 NOTE — Assessment & Plan Note (Signed)
No evidence of recurrence macroscopically and in absence of new symptoms or obvious clinical  change on plain cxr reluctant to go looking for more microscopic changes  Due to risk of more surgical complications  Discussed in detail all the  indications, usual  risks and alternatives  relative to the benefits with patient who agrees to proceed with conservative f/u only

## 2010-12-30 ENCOUNTER — Other Ambulatory Visit: Payer: Self-pay | Admitting: Internal Medicine

## 2011-01-19 ENCOUNTER — Encounter: Payer: Self-pay | Admitting: Internal Medicine

## 2011-01-19 ENCOUNTER — Ambulatory Visit (INDEPENDENT_AMBULATORY_CARE_PROVIDER_SITE_OTHER): Payer: MEDICARE | Admitting: Internal Medicine

## 2011-01-19 VITALS — BP 142/72 | HR 66 | Temp 98.2°F | Ht 62.0 in | Wt 148.0 lb

## 2011-01-19 DIAGNOSIS — R05 Cough: Secondary | ICD-10-CM

## 2011-01-19 NOTE — Progress Notes (Signed)
Subjective:     Patient ID: Courtney Day, female   DOB: [birth year], 76 y.o.   MRN: [id number]  HPI 57  yobf never smoker with heavy remote passive smoking exposure with URI first of 2010 with cxr suggesting RLL nodule in setting of bad chest cold that never went away, already rx with sev courses of abx.   May 22, 2008 initial pulmonary evaluation at the request of Dr. Posey Rea for SPN with persistent dry cough on ACE that bothers her husband more than it does her. No wheeze or increase doe. no hemoptysis. Rec PET scan and d/c ace > micardis 40   May 30, 2008 ov cc dry cough much better but not completely gone. no hemoptysis.   Jul 12, 2008 ov >  no cough off ace, no hemoptysis.   W/u for nodule > underwent R VATS  8/04/01/08 by [facility] granulomas RLL no pos cultures  > ID eval no rx   12/22/2010 f/u ov/Wert cc R side of chest ever since surgery assoc with burning numbness and hard to wear to bra and assoc of throat drainge worse p stir in am with minimal clear mucus. rec Try prilosec 20mg   Take 30-60 min before first meal of the day and Pepcid 20 mg one bedtime  GERD. Zostric cream 4 x daily to chest wall area to stop the burning pain - the most important dose is at bedtime but use a very small amount to start until you get used to it.   01/19/2011 f/u ov/Wert cc intermittent waking up maybe 2x weekly with throat congestion, husband says more freq but agrees   Better than it was > clear mucus - not compliant with ppi rx because pt takes her own meds and has dementia.  No overt sinus or reflux symptoms, no sob.  Sleeping ok without nocturnal  or early am exacerbation  of respiratory  c/o's or need for noct saba. Also denies any obvious fluctuation of symptoms with weather or environmental changes or other aggravating or alleviating factors except as outlined above    Pt denies any significant sore throat, nasal congestion or excess secretions, fever, chills, sweats, unintended wt loss,  pleuritic or exertional cp, orthopnea pnd or leg swelling. Pt also denies any obvious fluctuation in symptoms with weather or environmental change or other alleviating or aggravating factors.          Allergies  1) ! * Exforge  2) ! Diovan  3) ! Ace Inhibitors      Past Medical History:  Depression  Hypertension  Low back pain  B knee pain  [name]  Osteoarthritis  Allergic rhinitis  Asthma > probably all pseudoasthma  - D/C ACE May 22, 2008 > pseudowheeze resolved, cough resolved  SPN RLL  - See CT Chest 05/04/08  - PET 05/28/08 Nodule cold but nonspecific uptake in multiple nodes  - CXR September 04, 2008 slt growth and more dense  -R VATS  8/04/01/08 by [facility] granulomas RLL no pos cultures  > ID eval no rx      Family History:  Family History Hypertension  Family History Lung cancer in sister ( smoker)   Social History:  Retired  Married  Never Smoked  Last heavy exposure 1994         Objective:   Physical Exam    amb bf nad  wt  161 May 22, 2008 >> 171 September 04, 2008 > 12/22/2010 153 > 01/19/2011 148 HEENT:  nl dentition, turbinates, and orophanx. Nl external ear canals without cough reflex  NECK : without JVD/Nodes/TM/ nl carotid upstrokes bilaterally  LUNGS: no acc muscle use, clear to A and P bilaterally without cough on insp or exp maneuvers  CV: RRR no s3 or murmur or increase in P2, no edema  ABD: soft and nontender with nl excursion in the supine position. No bruits or organomegaly, bowel sounds nl  MS: warm without deformities, calf tenderness, cyanosis or clubbing  CXR  12/22/2010 :  Stable postoperative chest. No active cardiopulmonary process.     Assessment:         Plan:

## 2011-01-19 NOTE — Patient Instructions (Signed)
Try prilosec 20mg   Take 30-60 min before first meal of the day and Pepcid 20 mg one bedtime along with the reflux diet  Please schedule a follow up office visit in 6 weeks, call sooner if needed with medications and pill box in hand with all your medications as well

## 2011-01-19 NOTE — Assessment & Plan Note (Signed)
Still strongly suspect  Classic Upper airway cough syndrome, so named because it's frequently impossible to sort out how much is  CR/sinusitis with freq throat clearing (which can be related to primary GERD)   vs  causing  secondary (" extra esophageal")  GERD from wide swings in gastric pressure that occur with throat clearing, often  promoting self use of mint and menthol lozenges that reduce the lower esophageal sphincter tone and exacerbate the problem further in a cyclical fashion.   These are the same pts who not infrequently have failed to tolerate ace inhibitors,  dry powder inhalers or biphosphonates or report having reflux symptoms that don't respond to standard doses of PPI , and are easily confused as having aecopd or asthma flares,   Rec max gerd rx then sinus ct next step  I had an extended discussion with the patient and husband today lasting 15 to 20 minutes of a 25 minute visit on the following issues:   Unlike when you get a prescription for eyeglasses, it's not possible to always walk out of this or any medical office with a perfect prescription that is immediately effective  based on any test that we offer here.    On the contrary, it may take several weeks for the full impact of changes recommened today (especially the rec to follow diet and take ppi consistently)  - hopefully you will respond well.  If not, then we'll adjust your medication on your next visit accordingly, knowing more then than we can possibly know now.

## 2011-02-20 ENCOUNTER — Other Ambulatory Visit: Payer: Self-pay | Admitting: *Deleted

## 2011-02-20 MED ORDER — DICLOFENAC SODIUM 1 % TD GEL: 1.0000 "application " | Freq: Four times a day (QID) | TRANSDERMAL | Status: DC

## 2011-03-09 ENCOUNTER — Ambulatory Visit (INDEPENDENT_AMBULATORY_CARE_PROVIDER_SITE_OTHER): Payer: MEDICARE | Admitting: Internal Medicine

## 2011-03-09 ENCOUNTER — Encounter: Payer: Self-pay | Admitting: Internal Medicine

## 2011-03-09 VITALS — BP 146/78 | HR 66 | Temp 98.4°F | Ht 62.0 in | Wt 151.8 lb

## 2011-03-09 DIAGNOSIS — R05 Cough: Secondary | ICD-10-CM | POA: Diagnosis not present

## 2011-03-09 NOTE — Progress Notes (Signed)
Subjective:     Patient ID: Courtney Day, female   DOB: 10/19/1934    MRN: [id number]  Brief patient profile:  52  yobf never smoker with heavy remote passive smoking exposure with URI first of 2010 with cxr suggesting RLL nodule in setting of bad chest cold that never went away, already rx with sev courses of abx.   May 22, 2008 initial pulmonary evaluation at the request of Dr. Posey Day for SPN with persistent dry cough on ACE that bothers her husband more than it does her. No wheeze or increase doe. no hemoptysis. Rec PET scan and d/c ace > micardis 40   May 30, 2008 ov cc dry cough much better but not completely gone. no hemoptysis.   Jul 12, 2008 ov >  no cough off ace, no hemoptysis.   W/u for nodule > underwent R VATS  8/04/01/08 by Courtney Day granulomas RLL no pos cultures  > ID eval no rx   12/22/2010 f/u ov/Courtney Day cc R side of chest ever since surgery assoc with burning numbness and hard to wear to bra and assoc of throat drainge worse p stir in am with minimal clear mucus. rec Try prilosec 20mg   Take 30-60 min before first meal of the day and Pepcid 20 mg one bedtime  GERD. Zostric cream 4 x daily to chest wall area to stop the burning pain - the most important dose is at bedtime but use a very small amount to start until you get used to it.   01/19/2011 f/u ov/Courtney Day cc intermittent waking up maybe 2x weekly with throat congestion, husband says more freq but agrees   Better than it was > clear mucus - not compliant with ppi rx because pt takes her own meds and has dementia.  No overt sinus or reflux symptoms, no sob. rec Try prilosec 20mg   Take 30-60 min before first meal of the day and Pepcid 20 mg one bedtime along with the reflux diet   03/09/2011 f/u ov/Courtney Day cc better sleeping fine on pepcid 20mg  one at bedtime  Sleeping ok without nocturnal  or early am exacerbation  of respiratory  c/o's or need for noct saba. Also denies any obvious fluctuation of symptoms with weather or  environmental changes or other aggravating or alleviating factors except as outlined above    Pt denies any significant sore throat, nasal congestion or excess secretions, fever, chills, sweats, unintended wt loss, pleuritic or exertional cp, orthopnea pnd or leg swelling. Pt also denies any obvious fluctuation in symptoms with weather or environmental change or other alleviating or aggravating factors.          Allergies  1) ! * Exforge  2) ! Diovan  3) ! Ace Inhibitors      Past Medical History:  Depression  Hypertension  Low back pain  B knee pain  Courtney Day  Osteoarthritis  Allergic rhinitis  Asthma > probably all pseudoasthma  - D/C ACE May 22, 2008 > pseudowheeze resolved, cough resolved  SPN RLL  - See CT Chest 05/04/08  - PET 05/28/08 Nodule cold but nonspecific uptake in multiple nodes  - CXR September 04, 2008 slt growth and more dense  -R VATS  8/04/01/08 by Courtney Day granulomas RLL no pos cultures  > ID eval no rx      Family History:  Family History Hypertension  Family History Lung cancer in sister ( smoker)     Social History:  Retired  Married  Never  Smoked  Last heavy exposure 1994         Objective:   Physical Exam    amb bf nad  wt  161 May 22, 2008 >> 171 September 04, 2008 > 12/22/2010 153 > 01/19/2011 148 > 151 03/09/2011  HEENT: nl dentition, turbinates, and orophanx. Nl external ear canals without cough reflex  NECK : without JVD/Nodes/TM/ nl carotid upstrokes bilaterally  LUNGS: no acc muscle use, clear to A and P bilaterally without cough on insp or exp maneuvers  CV: RRR no s3 or murmur or increase in P2, no edema  ABD: soft and nontender with nl excursion in the supine position. No bruits or organomegaly, bowel sounds nl  MS: warm without deformities, calf tenderness, cyanosis or clubbing  CXR  12/22/2010 : Stable postoperative chest. No active cardiopulmonary process.     Assessment:         Plan:

## 2011-03-09 NOTE — Patient Instructions (Signed)
Stay on the prilosec before breakfast and pepcid at bedtime x 3 months and if 100% better then stop the am prilosec  GERD (REFLUX)  is an extremely common cause of respiratory symptoms, many times with no significant heartburn at all.    It can be treated with medication, but also with lifestyle changes including avoidance of late meals, excessive alcohol, smoking cessation, and avoid fatty foods, chocolate, peppermint, colas, red wine, and acidic juices such as orange juice.  NO MINT OR MENTHOL PRODUCTS SO NO COUGH DROPS  USE SUGARLESS CANDY INSTEAD (jolley ranchers or Stover's)  NO OIL BASED VITAMINS - use powdered substitutes.     If you are satisfied with your treatment plan let your doctor know and he/she can either refill your medications or you can return here when your prescription runs out.     If in any way you are not 100% satisfied,  please tell us.  If 100% better, tell your friends!

## 2011-03-13 NOTE — Assessment & Plan Note (Signed)
Classic Upper airway cough syndrome, so named because it's frequently impossible to sort out how much is  CR/sinusitis with freq throat clearing (which can be related to primary GERD)   vs  causing  secondary (" extra esophageal")  GERD from wide swings in gastric pressure that occur with throat clearing, often  promoting self use of mint and menthol lozenges that reduce the lower esophageal sphincter tone and exacerbate the problem further in a cyclical fashion.   These are the same pts who not infrequently have failed to tolerate ace inhibitors,  dry powder inhalers or biphosphonates or report having reflux symptoms that don't respond to standard doses of PPI , and are easily confused as having aecopd or asthma flares,   Rec 3 months of rx then consider taper and see if flares    Each maintenance medication was reviewed in detail including most importantly the difference between maintenance and as needed and under what circumstances the prns are to be used.  Please see instructions for details which were reviewed in writing and the patient given a copy.

## 2011-03-17 DIAGNOSIS — H04129 Dry eye syndrome of unspecified lacrimal gland: Secondary | ICD-10-CM | POA: Diagnosis not present

## 2011-08-03 ENCOUNTER — Ambulatory Visit (INDEPENDENT_AMBULATORY_CARE_PROVIDER_SITE_OTHER): Payer: MEDICARE | Admitting: Internal Medicine

## 2011-08-03 ENCOUNTER — Encounter: Payer: Self-pay | Admitting: Internal Medicine

## 2011-08-03 VITALS — BP 150/90 | HR 76 | Temp 98.6°F | Resp 16 | Wt 149.0 lb

## 2011-08-03 DIAGNOSIS — R071 Chest pain on breathing: Secondary | ICD-10-CM

## 2011-08-03 DIAGNOSIS — R0789 Other chest pain: Secondary | ICD-10-CM

## 2011-08-03 MED ORDER — GABAPENTIN 100 MG PO CAPS: 100.0000 mg | ORAL_CAPSULE | Freq: Three times a day (TID) | ORAL | Status: DC | PRN

## 2011-08-03 NOTE — Assessment & Plan Note (Signed)
She can use Gabapentin 100 mg x 3-4 times a day

## 2011-08-03 NOTE — Progress Notes (Signed)
Patient ID: Courtney Day, female   DOB: 1934-11-18, 77 y.o.   MRN: [id number]  Subjective:    Patient ID: Courtney Day, female    DOB: [birth year], 77 y.o.   MRN: [id number]  HPI  C/o R chest wall pain post-VATS in 2010 by Dr Harvel Quale. She had a mammo 2 wks ago. Pain is 3-4/10 She is taking Gabapentin 1 a day - it is helping some  Wt Readings from Last 3 Encounters:  08/03/11 149 lb (67.586 kg)  03/09/11 151 lb 12.8 oz (68.856 kg)  01/19/11 148 lb (67.132 kg)   BP Readings from Last 3 Encounters:  08/03/11 150/90  03/09/11 146/78  01/19/11 142/72      Review of Systems  Constitutional: Negative for chills, activity change, appetite change, fatigue and unexpected weight change.  HENT: Negative for congestion, mouth sores and sinus pressure.   Eyes: Negative for visual disturbance.  Respiratory: Negative for cough and chest tightness.   Cardiovascular: Positive for chest pain (R).  Gastrointestinal: Negative for nausea and abdominal pain.  Genitourinary: Negative for frequency, difficulty urinating and vaginal pain.  Musculoskeletal: Negative for back pain and gait problem.  Skin: Negative for pallor and rash.  Neurological: Negative for dizziness, tremors, weakness, numbness and headaches.  Psychiatric/Behavioral: Negative for confusion and sleep disturbance.       Objective:   Physical Exam  Constitutional: She appears well-developed and well-nourished. No distress.  HENT:  Head: Normocephalic.  Right Ear: External ear normal.  Left Ear: External ear normal.  Nose: Nose normal.  Mouth/Throat: Oropharynx is clear and moist.  Eyes: Conjunctivae are normal. Pupils are equal, round, and reactive to light. Right eye exhibits no discharge. Left eye exhibits no discharge.  Neck: Normal range of motion. Neck supple. No JVD present. No tracheal deviation present. No thyromegaly present.  Cardiovascular: Normal rate, regular rhythm and normal heart sounds.   Pulmonary/Chest: No  stridor. No respiratory distress. She has no wheezes.  Abdominal: Soft. Bowel sounds are normal. She exhibits no distension and no mass. There is no tenderness. There is no rebound and no guarding.  Musculoskeletal: She exhibits no edema and no tenderness.  Lymphadenopathy:    She has no cervical adenopathy.  Neurological: She displays normal reflexes. No cranial nerve deficit. She exhibits normal muscle tone. Coordination normal.  Skin: No rash noted. No erythema.       vitiligo  Psychiatric: She has a normal mood and affect. Her behavior is normal. Judgment and thought content normal.   Tender over a scar over R posterolateral chest wall on palpation     Lab Results  Component Value Date   WBC 13.3* 10/24/2008   HGB 11.6* 10/24/2008   HCT 35.6* 10/24/2008   PLT 206 10/24/2008   GLUCOSE 108* 10/24/2008   ALT 19 10/21/2008   AST 28 10/21/2008   NA 140 10/24/2008   K 3.8 10/24/2008   CL 104 10/24/2008   CREATININE 0.88 10/24/2008   BUN 19 10/24/2008   CO2 29 10/24/2008   INR 1.1 10/17/2008     Assessment & Plan:

## 2011-08-05 ENCOUNTER — Other Ambulatory Visit: Payer: Self-pay | Admitting: Internal Medicine

## 2011-08-13 ENCOUNTER — Other Ambulatory Visit: Payer: Self-pay | Admitting: Internal Medicine

## 2011-09-03 ENCOUNTER — Other Ambulatory Visit: Payer: Self-pay | Admitting: Internal Medicine

## 2011-11-24 ENCOUNTER — Encounter: Payer: Self-pay | Admitting: Internal Medicine

## 2011-11-24 ENCOUNTER — Ambulatory Visit (INDEPENDENT_AMBULATORY_CARE_PROVIDER_SITE_OTHER): Payer: MEDICARE | Admitting: Internal Medicine

## 2011-11-24 VITALS — BP 152/82 | HR 80 | Temp 98.0°F | Resp 16 | Wt 144.0 lb

## 2011-11-24 DIAGNOSIS — L80 Vitiligo: Secondary | ICD-10-CM

## 2011-11-24 DIAGNOSIS — I10 Essential (primary) hypertension: Secondary | ICD-10-CM | POA: Diagnosis not present

## 2011-11-24 DIAGNOSIS — F068 Other specified mental disorders due to known physiological condition: Secondary | ICD-10-CM | POA: Diagnosis not present

## 2011-11-24 DIAGNOSIS — R071 Chest pain on breathing: Secondary | ICD-10-CM | POA: Diagnosis not present

## 2011-11-24 DIAGNOSIS — R0789 Other chest pain: Secondary | ICD-10-CM

## 2011-11-24 MED ORDER — LIDOCAINE 5 % EX PTCH: 1.0000 | MEDICATED_PATCH | CUTANEOUS | Status: DC

## 2011-11-24 MED ORDER — GABAPENTIN 100 MG PO CAPS: 100.0000 mg | ORAL_CAPSULE | Freq: Three times a day (TID) | ORAL | Status: DC | PRN

## 2011-11-24 NOTE — Assessment & Plan Note (Signed)
Post-op neuralgia and scar tissue s/p VATS in 2010  She is taking Gabapentin 1 a day. See new instructions - ok to increase the dose

## 2011-11-24 NOTE — Assessment & Plan Note (Signed)
Continue with current prescription therapy as reflected on the Med list.  

## 2011-11-24 NOTE — Assessment & Plan Note (Addendum)
I suggested to try a spray tan, make up and consider tattooing color into skin as options

## 2011-11-24 NOTE — Progress Notes (Signed)
   Subjective:    Patient ID: Courtney Day, female    DOB: [birth year], 77 y.o.   MRN: [id number]  HPI  C/o R chest wall pain post-VATS in 2010 by Dr Harvel Quale - not better. She had a mammo. Pain is 6-7/10 She is taking Gabapentin 1 a day - it is helping some. C/o sinus drainage in am..Marland KitchenC/o skin discoloration - on hands mostly  Wt Readings from Last 3 Encounters:  11/24/11 144 lb (65.318 kg)  08/03/11 149 lb (67.586 kg)  03/09/11 151 lb 12.8 oz (68.856 kg)   BP Readings from Last 3 Encounters:  11/24/11 152/82  08/03/11 150/90  03/09/11 146/78      Review of Systems  Constitutional: Negative for chills, activity change, appetite change, fatigue and unexpected weight change.  HENT: Negative for congestion, mouth sores and sinus pressure.   Eyes: Negative for visual disturbance.  Respiratory: Negative for cough and chest tightness.   Cardiovascular: Positive for chest pain (R).  Gastrointestinal: Negative for nausea and abdominal pain.  Genitourinary: Negative for frequency, difficulty urinating and vaginal pain.  Musculoskeletal: Negative for back pain and gait problem.  Skin: Negative for pallor and rash.  Neurological: Negative for dizziness, tremors, weakness, numbness and headaches.  Psychiatric/Behavioral: Negative for confusion and disturbed wake/sleep cycle.       Objective:   Physical Exam  Constitutional: She appears well-developed and well-nourished. No distress.  HENT:  Head: Normocephalic.  Right Ear: External ear normal.  Left Ear: External ear normal.  Nose: Nose normal.  Mouth/Throat: Oropharynx is clear and moist.  Eyes: Conjunctivae normal are normal. Pupils are equal, round, and reactive to light. Right eye exhibits no discharge. Left eye exhibits no discharge.  Neck: Normal range of motion. Neck supple. No JVD present. No tracheal deviation present. No thyromegaly present.  Cardiovascular: Normal rate, regular rhythm and normal heart sounds.     Pulmonary/Chest: No stridor. No respiratory distress. She has no wheezes.  Abdominal: Soft. Bowel sounds are normal. She exhibits no distension and no mass. There is no tenderness. There is no rebound and no guarding.  Musculoskeletal: She exhibits no edema and no tenderness.  Lymphadenopathy:    She has no cervical adenopathy.  Neurological: She displays normal reflexes. No cranial nerve deficit. She exhibits normal muscle tone. Coordination normal.  Skin: No rash noted. No erythema.       vitiligo  Psychiatric: She has a normal mood and affect. Her behavior is normal. Judgment and thought content normal.   Tender over a scar over R posterolateral chest wall on palpation     Lab Results  Component Value Date   WBC 13.3* 10/24/2008   HGB 11.6* 10/24/2008   HCT 35.6* 10/24/2008   PLT 206 10/24/2008   GLUCOSE 108* 10/24/2008   ALT 19 10/21/2008   AST 28 10/21/2008   NA 140 10/24/2008   K 3.8 10/24/2008   CL 104 10/24/2008   CREATININE 0.88 10/24/2008   BUN 19 10/24/2008   CO2 29 10/24/2008   INR 1.1 10/17/2008     Assessment & Plan:

## 2011-12-22 DIAGNOSIS — I10 Essential (primary) hypertension: Secondary | ICD-10-CM | POA: Diagnosis not present

## 2011-12-22 DIAGNOSIS — R413 Other amnesia: Secondary | ICD-10-CM | POA: Diagnosis not present

## 2012-01-01 ENCOUNTER — Encounter: Payer: Self-pay | Admitting: Internal Medicine

## 2012-01-19 ENCOUNTER — Other Ambulatory Visit: Payer: Self-pay | Admitting: Neurology

## 2012-01-19 DIAGNOSIS — I10 Essential (primary) hypertension: Secondary | ICD-10-CM

## 2012-01-19 DIAGNOSIS — R413 Other amnesia: Secondary | ICD-10-CM

## 2012-01-19 DIAGNOSIS — B392 Pulmonary histoplasmosis capsulati, unspecified: Secondary | ICD-10-CM

## 2012-01-27 ENCOUNTER — Ambulatory Visit
Admission: RE | Admit: 2012-01-27 | Discharge: 2012-01-27 | Disposition: A | Discharge status: Home / Self Care | Payer: No Typology Code available for payment source | Source: Ambulatory Visit | Attending: Neurology | Admitting: Neurology

## 2012-01-27 DIAGNOSIS — I10 Essential (primary) hypertension: Secondary | ICD-10-CM

## 2012-01-27 DIAGNOSIS — B392 Pulmonary histoplasmosis capsulati, unspecified: Secondary | ICD-10-CM

## 2012-01-27 DIAGNOSIS — R413 Other amnesia: Secondary | ICD-10-CM

## 2012-01-29 DIAGNOSIS — B392 Pulmonary histoplasmosis capsulati, unspecified: Secondary | ICD-10-CM | POA: Insufficient documentation

## 2012-01-29 DIAGNOSIS — I4891 Unspecified atrial fibrillation: Secondary | ICD-10-CM | POA: Insufficient documentation

## 2012-01-29 DIAGNOSIS — IMO0002 Reserved for concepts with insufficient information to code with codable children: Secondary | ICD-10-CM | POA: Diagnosis not present

## 2012-01-29 DIAGNOSIS — G309 Alzheimer's disease, unspecified: Secondary | ICD-10-CM | POA: Diagnosis not present

## 2012-01-29 DIAGNOSIS — F0280 Dementia in other diseases classified elsewhere without behavioral disturbance: Secondary | ICD-10-CM | POA: Diagnosis not present

## 2012-02-10 ENCOUNTER — Other Ambulatory Visit (HOSPITAL_COMMUNITY): Payer: Self-pay | Admitting: Neurology

## 2012-02-10 DIAGNOSIS — R413 Other amnesia: Secondary | ICD-10-CM

## 2012-02-29 ENCOUNTER — Ambulatory Visit (INDEPENDENT_AMBULATORY_CARE_PROVIDER_SITE_OTHER): Payer: MEDICARE | Admitting: Internal Medicine

## 2012-02-29 ENCOUNTER — Encounter: Payer: Self-pay | Admitting: Internal Medicine

## 2012-02-29 VITALS — BP 170/90 | HR 80 | Temp 97.8°F | Resp 16 | Wt 143.0 lb

## 2012-02-29 DIAGNOSIS — L80 Vitiligo: Secondary | ICD-10-CM

## 2012-02-29 DIAGNOSIS — R209 Unspecified disturbances of skin sensation: Secondary | ICD-10-CM

## 2012-02-29 DIAGNOSIS — R0789 Other chest pain: Secondary | ICD-10-CM

## 2012-02-29 DIAGNOSIS — I10 Essential (primary) hypertension: Secondary | ICD-10-CM | POA: Diagnosis not present

## 2012-02-29 DIAGNOSIS — R42 Dizziness and giddiness: Secondary | ICD-10-CM

## 2012-02-29 DIAGNOSIS — F068 Other specified mental disorders due to known physiological condition: Secondary | ICD-10-CM | POA: Diagnosis not present

## 2012-02-29 DIAGNOSIS — R739 Hyperglycemia, unspecified: Secondary | ICD-10-CM

## 2012-02-29 DIAGNOSIS — R202 Paresthesia of skin: Secondary | ICD-10-CM

## 2012-02-29 DIAGNOSIS — R071 Chest pain on breathing: Secondary | ICD-10-CM

## 2012-02-29 DIAGNOSIS — R7309 Other abnormal glucose: Secondary | ICD-10-CM

## 2012-02-29 NOTE — Progress Notes (Signed)
   Subjective:    Patient ID: Courtney Day, female    DOB: 05/01/1934, 78 y.o.   MRN: [id number]  HPI F/u elev BP - states it is 150-160 at home F/u on R chest wall pain post-VATS in 2010 by Dr Harvel Quale - better. She had a mammo.  She is taking Gabapentin 1 a day - it is helping some. C/o sinus drainage in am..Marland KitchenC/o skin discoloration - on hands mostly  Wt Readings from Last 3 Encounters:  02/29/12 143 lb (64.864 kg)  11/24/11 144 lb (65.318 kg)  08/03/11 149 lb (67.586 kg)   BP Readings from Last 3 Encounters:  02/29/12 170/90  11/24/11 152/82  08/03/11 150/90      Review of Systems  Constitutional: Negative for chills, activity change, appetite change, fatigue and unexpected weight change.  HENT: Negative for congestion, mouth sores and sinus pressure.   Eyes: Negative for visual disturbance.  Respiratory: Negative for cough and chest tightness.   Cardiovascular: Positive for chest pain (R).  Gastrointestinal: Negative for nausea and abdominal pain.  Genitourinary: Negative for frequency, difficulty urinating and vaginal pain.  Musculoskeletal: Negative for back pain and gait problem.  Skin: Negative for pallor and rash.  Neurological: Negative for dizziness, tremors, weakness, numbness and headaches.  Psychiatric/Behavioral: Negative for confusion and sleep disturbance.       Objective:   Physical Exam  Constitutional: She appears well-developed and well-nourished. No distress.  HENT:  Head: Normocephalic.  Right Ear: External ear normal.  Left Ear: External ear normal.  Nose: Nose normal.  Mouth/Throat: Oropharynx is clear and moist.  Eyes: Conjunctivae normal are normal. Pupils are equal, round, and reactive to light. Right eye exhibits no discharge. Left eye exhibits no discharge.  Neck: Normal range of motion. Neck supple. No JVD present. No tracheal deviation present. No thyromegaly present.  Cardiovascular: Normal rate, regular rhythm and normal heart sounds.     Pulmonary/Chest: No stridor. No respiratory distress. She has no wheezes.  Abdominal: Soft. Bowel sounds are normal. She exhibits no distension and no mass. There is no tenderness. There is no rebound and no guarding.  Musculoskeletal: She exhibits no edema and no tenderness.  Lymphadenopathy:    She has no cervical adenopathy.  Neurological: She displays normal reflexes. No cranial nerve deficit. She exhibits normal muscle tone. Coordination normal.  Skin: No rash noted. No erythema.       vitiligo  Psychiatric: She has a normal mood and affect. Her behavior is normal. Judgment and thought content normal.   Less tender over a scar over R posterolateral chest wall on palpation     Lab Results  Component Value Date   WBC 13.3* 10/24/2008   HGB 11.6* 10/24/2008   HCT 35.6* 10/24/2008   PLT 206 10/24/2008   GLUCOSE 108* 10/24/2008   ALT 19 10/21/2008   AST 28 10/21/2008   NA 140 10/24/2008   K 3.8 10/24/2008   CL 104 10/24/2008   CREATININE 0.88 10/24/2008   BUN 19 10/24/2008   CO2 29 10/24/2008   INR 1.1 10/17/2008     Assessment & Plan:

## 2012-02-29 NOTE — Assessment & Plan Note (Signed)
Continue with current prescription therapy as reflected on the Med list.  

## 2012-02-29 NOTE — Assessment & Plan Note (Signed)
Continue with current prescription therapy as reflected on the Med list. Better 

## 2012-02-29 NOTE — Assessment & Plan Note (Signed)
Not too well controled. She was taking 1/2 tab Norvasc/d - start 1 qd

## 2012-02-29 NOTE — Assessment & Plan Note (Signed)
Discussed.

## 2012-02-29 NOTE — Assessment & Plan Note (Signed)
No relapse 

## 2012-03-02 ENCOUNTER — Encounter (HOSPITAL_COMMUNITY)
Admission: RE | Admit: 2012-03-02 | Discharge: 2012-03-02 | Disposition: A | Discharge status: Home / Self Care | Payer: Self-pay | Source: Ambulatory Visit | Attending: Neurology | Admitting: Neurology

## 2012-03-02 ENCOUNTER — Encounter (HOSPITAL_COMMUNITY): Payer: Self-pay

## 2012-03-02 DIAGNOSIS — Z1231 Encounter for screening mammogram for malignant neoplasm of breast: Secondary | ICD-10-CM | POA: Diagnosis not present

## 2012-03-02 DIAGNOSIS — R413 Other amnesia: Secondary | ICD-10-CM | POA: Insufficient documentation

## 2012-03-02 DIAGNOSIS — Z006 Encounter for examination for normal comparison and control in clinical research program: Secondary | ICD-10-CM | POA: Insufficient documentation

## 2012-03-02 MED ORDER — FLUDEOXYGLUCOSE F - 18 (FDG) INJECTION: 10.5000 | Freq: Once | INTRAVENOUS | Status: AC | PRN

## 2012-03-21 DIAGNOSIS — H35379 Puckering of macula, unspecified eye: Secondary | ICD-10-CM | POA: Diagnosis not present

## 2012-03-29 ENCOUNTER — Encounter: Payer: Self-pay | Admitting: Internal Medicine

## 2012-05-31 ENCOUNTER — Encounter (INDEPENDENT_AMBULATORY_CARE_PROVIDER_SITE_OTHER): Payer: Self-pay

## 2012-05-31 DIAGNOSIS — Z0289 Encounter for other administrative examinations: Secondary | ICD-10-CM

## 2012-07-05 ENCOUNTER — Ambulatory Visit (INDEPENDENT_AMBULATORY_CARE_PROVIDER_SITE_OTHER): Payer: Self-pay

## 2012-07-05 DIAGNOSIS — Z0289 Encounter for other administrative examinations: Secondary | ICD-10-CM

## 2012-07-29 ENCOUNTER — Other Ambulatory Visit: Payer: Self-pay | Admitting: Internal Medicine

## 2012-08-01 ENCOUNTER — Ambulatory Visit (INDEPENDENT_AMBULATORY_CARE_PROVIDER_SITE_OTHER): Payer: Self-pay

## 2012-08-01 DIAGNOSIS — Z0289 Encounter for other administrative examinations: Secondary | ICD-10-CM

## 2012-08-31 ENCOUNTER — Ambulatory Visit (INDEPENDENT_AMBULATORY_CARE_PROVIDER_SITE_OTHER): Payer: MEDICARE | Admitting: Internal Medicine

## 2012-08-31 ENCOUNTER — Other Ambulatory Visit (INDEPENDENT_AMBULATORY_CARE_PROVIDER_SITE_OTHER): Payer: MEDICARE

## 2012-08-31 ENCOUNTER — Encounter: Payer: Self-pay | Admitting: Internal Medicine

## 2012-08-31 VITALS — BP 150/85 | HR 80 | Temp 97.6°F | Resp 16 | Wt 141.0 lb

## 2012-08-31 DIAGNOSIS — R209 Unspecified disturbances of skin sensation: Secondary | ICD-10-CM

## 2012-08-31 DIAGNOSIS — R0789 Other chest pain: Secondary | ICD-10-CM

## 2012-08-31 DIAGNOSIS — M1990 Unspecified osteoarthritis, unspecified site: Secondary | ICD-10-CM

## 2012-08-31 DIAGNOSIS — R071 Chest pain on breathing: Secondary | ICD-10-CM | POA: Diagnosis not present

## 2012-08-31 DIAGNOSIS — L80 Vitiligo: Secondary | ICD-10-CM | POA: Diagnosis not present

## 2012-08-31 DIAGNOSIS — F068 Other specified mental disorders due to known physiological condition: Secondary | ICD-10-CM | POA: Diagnosis not present

## 2012-08-31 DIAGNOSIS — R739 Hyperglycemia, unspecified: Secondary | ICD-10-CM

## 2012-08-31 DIAGNOSIS — R202 Paresthesia of skin: Secondary | ICD-10-CM

## 2012-08-31 DIAGNOSIS — I10 Essential (primary) hypertension: Secondary | ICD-10-CM

## 2012-08-31 DIAGNOSIS — R7309 Other abnormal glucose: Secondary | ICD-10-CM

## 2012-08-31 LAB — URINALYSIS, ROUTINE W REFLEX MICROSCOPIC
Ketones, ur: NEGATIVE
Leukocytes, UA: NEGATIVE
Specific Gravity, Urine: >=1.03 (ref 1.000–1.030)
Urine Glucose: NEGATIVE
Urobilinogen, UA: 0.2 (ref 0.0–1.0)

## 2012-08-31 LAB — BASIC METABOLIC PANEL
Calcium: 9.4 mg/dL (ref 8.4–10.5)
GFR: 83.73 mL/min (ref >60.00)
Potassium: 4.6 mEq/L (ref 3.5–5.1)
Sodium: 143 mEq/L (ref 135–145)

## 2012-08-31 LAB — CBC WITH DIFFERENTIAL/PLATELET
Basophils Absolute: 0 10*3/uL (ref 0.0–0.1)
Eosinophils Relative: 3 % (ref 0.0–5.0)
HCT: 38.3 % (ref 36.0–46.0)
Hemoglobin: 12.6 g/dL (ref 12.0–15.0)
Lymphocytes Relative: 24.7 % (ref 12.0–46.0)
Lymphs Abs: 2.1 10*3/uL (ref 0.7–4.0)
Monocytes Relative: 7.4 % (ref 3.0–12.0)
Neutro Abs: 5.4 10*3/uL (ref 1.4–7.7)
WBC: 8.4 10*3/uL (ref 4.5–10.5)

## 2012-08-31 LAB — HEMOGLOBIN A1C: Hgb A1c MFr Bld: 5.7 % (ref 4.6–6.5)

## 2012-08-31 LAB — HEPATIC FUNCTION PANEL
ALT: 14 U/L (ref 0–35)
AST: 21 U/L (ref 0–37)
Albumin: 3.7 g/dL (ref 3.5–5.2)
Total Bilirubin: 0.6 mg/dL (ref 0.3–1.2)

## 2012-08-31 LAB — LIPID PANEL
HDL: 57 mg/dL (ref >39.00)
Total CHOL/HDL Ratio: 3
Triglycerides: 85 mg/dL (ref 0.0–149.0)
VLDL: 17 mg/dL (ref 0.0–40.0)

## 2012-08-31 LAB — VITAMIN B12: Vitamin B-12: 410 pg/mL (ref 211–911)

## 2012-08-31 LAB — TSH: TSH: 2 u[IU]/mL (ref 0.35–5.50)

## 2012-08-31 MED ORDER — DICLOFENAC SODIUM 1 % TD GEL: 4.0000 g | Freq: Four times a day (QID) | TRANSDERMAL | Status: DC

## 2012-08-31 MED ORDER — TRIAMTERENE-HCTZ 37.5-25 MG PO TABS: 1.0000 | ORAL_TABLET | Freq: Every day | ORAL | Status: DC

## 2012-08-31 NOTE — Progress Notes (Signed)
Patient ID: Courtney Day, female   DOB: Dec 19, 1934, 78 y.o.   MRN: [id number]   Subjective:   HPI   F/u elev BP - states it is 150-160 at home F/u on R chest wall pain post-VATS in 2010 by Dr Harvel Quale - better. She had a mammo.  She is taking Gabapentin 1 a day - it is helping some. C/o sinus drainage in am..Marland KitchenC/o skin discoloration - on hands mostly  Wt Readings from Last 3 Encounters:  08/31/12 141 lb (63.957 kg)  02/29/12 143 lb (64.864 kg)  11/24/11 144 lb (65.318 kg)   BP Readings from Last 3 Encounters:  08/31/12 190/100  02/29/12 170/90  11/24/11 152/82      Review of Systems  Constitutional: Negative for chills, activity change, appetite change, fatigue and unexpected weight change.  HENT: Negative for congestion, mouth sores and sinus pressure.   Eyes: Negative for visual disturbance.  Respiratory: Negative for cough and chest tightness.   Cardiovascular: Positive for chest pain (R).  Gastrointestinal: Negative for nausea and abdominal pain.  Genitourinary: Negative for frequency, difficulty urinating and vaginal pain.  Musculoskeletal: Negative for back pain and gait problem.  Skin: Negative for pallor and rash.  Neurological: Negative for dizziness, tremors, weakness, numbness and headaches.  Psychiatric/Behavioral: Negative for confusion and sleep disturbance.       Objective:   Physical Exam  Constitutional: She appears well-developed and well-nourished. No distress.  HENT:  Head: Normocephalic.  Right Ear: External ear normal.  Left Ear: External ear normal.  Nose: Nose normal.  Mouth/Throat: Oropharynx is clear and moist.  Eyes: Conjunctivae are normal. Pupils are equal, round, and reactive to light. Right eye exhibits no discharge. Left eye exhibits no discharge.  Neck: Normal range of motion. Neck supple. No JVD present. No tracheal deviation present. No thyromegaly present.  Cardiovascular: Normal rate, regular rhythm and normal heart sounds.    Pulmonary/Chest: No stridor. No respiratory distress. She has no wheezes.  Abdominal: Soft. Bowel sounds are normal. She exhibits no distension and no mass. There is no tenderness. There is no rebound and no guarding.  Musculoskeletal: She exhibits no edema and no tenderness.  Lymphadenopathy:    She has no cervical adenopathy.  Neurological: She displays normal reflexes. No cranial nerve deficit. She exhibits normal muscle tone. Coordination normal.  Skin: No rash noted. No erythema.  vitiligo  Psychiatric: She has a normal mood and affect. Her behavior is normal. Judgment and thought content normal.        Lab Results  Component Value Date   WBC 13.3* 10/24/2008   HGB 11.6* 10/24/2008   HCT 35.6* 10/24/2008   PLT 206 10/24/2008   GLUCOSE 108* 10/24/2008   ALT 19 10/21/2008   AST 28 10/21/2008   NA 140 10/24/2008   K 3.8 10/24/2008   CL 104 10/24/2008   CREATININE 0.88 10/24/2008   BUN 19 10/24/2008   CO2 29 10/24/2008   INR 1.1 10/17/2008     Assessment & Plan:

## 2012-08-31 NOTE — Assessment & Plan Note (Signed)
Per Derm

## 2012-08-31 NOTE — Assessment & Plan Note (Signed)
D/c HCTZ Start Maxzide 

## 2012-08-31 NOTE — Assessment & Plan Note (Signed)
Continue with current prescription therapy as reflected on the Med list.  

## 2012-08-31 NOTE — Assessment & Plan Note (Signed)
Declined Gabapentin Pain clinic/RF treatment was suggested

## 2012-08-31 NOTE — Assessment & Plan Note (Signed)
Chronic B knees Voltaren gel

## 2012-09-05 ENCOUNTER — Ambulatory Visit (INDEPENDENT_AMBULATORY_CARE_PROVIDER_SITE_OTHER): Payer: Self-pay

## 2012-09-05 DIAGNOSIS — Z0289 Encounter for other administrative examinations: Secondary | ICD-10-CM

## 2012-09-07 ENCOUNTER — Encounter: Payer: Self-pay | Admitting: Internal Medicine

## 2012-09-26 ENCOUNTER — Ambulatory Visit: Payer: Self-pay

## 2012-09-27 ENCOUNTER — Ambulatory Visit (INDEPENDENT_AMBULATORY_CARE_PROVIDER_SITE_OTHER): Payer: Self-pay

## 2012-09-27 DIAGNOSIS — Z0289 Encounter for other administrative examinations: Secondary | ICD-10-CM

## 2012-10-21 ENCOUNTER — Encounter: Payer: Self-pay | Admitting: Neurology

## 2012-10-31 ENCOUNTER — Ambulatory Visit (INDEPENDENT_AMBULATORY_CARE_PROVIDER_SITE_OTHER): Payer: Self-pay

## 2012-10-31 DIAGNOSIS — Z0289 Encounter for other administrative examinations: Secondary | ICD-10-CM

## 2012-11-15 ENCOUNTER — Ambulatory Visit: Payer: Self-pay

## 2012-11-22 ENCOUNTER — Encounter (INDEPENDENT_AMBULATORY_CARE_PROVIDER_SITE_OTHER): Payer: Self-pay

## 2012-11-22 DIAGNOSIS — Z0289 Encounter for other administrative examinations: Secondary | ICD-10-CM

## 2012-12-21 ENCOUNTER — Encounter: Payer: Self-pay | Admitting: Internal Medicine

## 2012-12-21 ENCOUNTER — Ambulatory Visit (INDEPENDENT_AMBULATORY_CARE_PROVIDER_SITE_OTHER): Payer: MEDICARE | Admitting: Internal Medicine

## 2012-12-21 VITALS — BP 122/78 | HR 60 | Temp 97.6°F | Ht 64.5 in | Wt 142.0 lb

## 2012-12-21 DIAGNOSIS — F068 Other specified mental disorders due to known physiological condition: Secondary | ICD-10-CM | POA: Diagnosis not present

## 2012-12-21 DIAGNOSIS — I10 Essential (primary) hypertension: Secondary | ICD-10-CM

## 2012-12-21 NOTE — Patient Instructions (Signed)
Wt Readings from Last 3 Encounters:  12/21/12 142 lb (64.411 kg)  12/22/11 141 lb (63.957 kg)  08/31/12 141 lb (63.957 kg)   BP Readings from Last 3 Encounters:  12/21/12 122/78  12/22/11 140/80  08/31/12 150/85

## 2012-12-21 NOTE — Progress Notes (Signed)
   Subjective:   HPI   F/u elev BP - states it is 150-160 at home F/u on R chest wall pain post-VATS in 2010 by Dr Harvel Quale - better. She had a mammo.  She is taking Gabapentin 1 a day - it is helping some. C/o sinus drainage in am..Marland KitchenC/o skin discoloration - on hands mostly  Wt Readings from Last 3 Encounters:  12/21/12 142 lb (64.411 kg)  12/22/11 141 lb (63.957 kg)  08/31/12 141 lb (63.957 kg)   BP Readings from Last 3 Encounters:  12/21/12 122/78  12/22/11 140/80  08/31/12 150/85      Review of Systems  Constitutional: Negative for chills, activity change, appetite change, fatigue and unexpected weight change.  HENT: Negative for congestion, mouth sores and sinus pressure.   Eyes: Negative for visual disturbance.  Respiratory: Negative for cough and chest tightness.   Cardiovascular: Positive for chest pain (R).  Gastrointestinal: Negative for nausea and abdominal pain.  Genitourinary: Negative for frequency, difficulty urinating and vaginal pain.  Musculoskeletal: Negative for back pain and gait problem.  Skin: Negative for pallor and rash.  Neurological: Negative for dizziness, tremors, weakness, numbness and headaches.  Psychiatric/Behavioral: Negative for confusion and sleep disturbance.       Objective:   Physical Exam  Constitutional: She appears well-developed and well-nourished. No distress.  HENT:  Head: Normocephalic.  Right Ear: External ear normal.  Left Ear: External ear normal.  Nose: Nose normal.  Mouth/Throat: Oropharynx is clear and moist.  Eyes: Conjunctivae are normal. Pupils are equal, round, and reactive to light. Right eye exhibits no discharge. Left eye exhibits no discharge.  Neck: Normal range of motion. Neck supple. No JVD present. No tracheal deviation present. No thyromegaly present.  Cardiovascular: Normal rate, regular rhythm and normal heart sounds.   Pulmonary/Chest: No stridor. No respiratory distress. She has no wheezes.  Abdominal:  Soft. Bowel sounds are normal. She exhibits no distension and no mass. There is no tenderness. There is no rebound and no guarding.  Musculoskeletal: She exhibits no edema and no tenderness.  Lymphadenopathy:    She has no cervical adenopathy.  Neurological: She displays normal reflexes. No cranial nerve deficit. She exhibits normal muscle tone. Coordination normal.  Skin: No rash noted. No erythema.  vitiligo  Psychiatric: She has a normal mood and affect. Her behavior is normal. Judgment and thought content normal.        Lab Results  Component Value Date   WBC 8.4 08/31/2012   HGB 12.6 08/31/2012   HCT 38.3 08/31/2012   PLT 176.0 08/31/2012   GLUCOSE 96 08/31/2012   CHOL 167 08/31/2012   TRIG 85.0 08/31/2012   HDL 57.00 08/31/2012   LDLCALC 93 08/31/2012   ALT 14 08/31/2012   AST 21 08/31/2012   NA 143 08/31/2012   K 4.6 08/31/2012   CL 107 08/31/2012   CREATININE 0.9 08/31/2012   BUN 19 08/31/2012   CO2 36* 08/31/2012   TSH 2.00 08/31/2012   INR 1.1 10/17/2008   HGBA1C 5.7 08/31/2012     Assessment & Plan:

## 2012-12-25 NOTE — Assessment & Plan Note (Signed)
Continue with current prescription therapy as reflected on the Med list.  

## 2012-12-25 NOTE — Assessment & Plan Note (Signed)
Continue with current prescription therapy as reflected on the Med list. BP Readings from Last 3 Encounters:  12/21/12 122/78  12/22/11 140/80  08/31/12 150/85

## 2012-12-26 ENCOUNTER — Encounter (INDEPENDENT_AMBULATORY_CARE_PROVIDER_SITE_OTHER): Payer: Self-pay

## 2012-12-26 DIAGNOSIS — Z0289 Encounter for other administrative examinations: Secondary | ICD-10-CM

## 2013-01-16 ENCOUNTER — Encounter (INDEPENDENT_AMBULATORY_CARE_PROVIDER_SITE_OTHER): Payer: Self-pay

## 2013-01-16 DIAGNOSIS — Z0289 Encounter for other administrative examinations: Secondary | ICD-10-CM

## 2013-01-18 DIAGNOSIS — H35379 Puckering of macula, unspecified eye: Secondary | ICD-10-CM | POA: Diagnosis not present

## 2013-02-06 ENCOUNTER — Encounter (INDEPENDENT_AMBULATORY_CARE_PROVIDER_SITE_OTHER): Payer: Self-pay

## 2013-02-06 DIAGNOSIS — Z0289 Encounter for other administrative examinations: Secondary | ICD-10-CM

## 2013-03-09 ENCOUNTER — Encounter (INDEPENDENT_AMBULATORY_CARE_PROVIDER_SITE_OTHER): Payer: Self-pay

## 2013-03-09 DIAGNOSIS — Z0289 Encounter for other administrative examinations: Secondary | ICD-10-CM

## 2013-03-17 DIAGNOSIS — I10 Essential (primary) hypertension: Secondary | ICD-10-CM | POA: Diagnosis not present

## 2013-03-17 DIAGNOSIS — F0280 Dementia in other diseases classified elsewhere without behavioral disturbance: Secondary | ICD-10-CM | POA: Diagnosis not present

## 2013-03-17 DIAGNOSIS — G309 Alzheimer's disease, unspecified: Secondary | ICD-10-CM | POA: Diagnosis not present

## 2013-04-06 ENCOUNTER — Other Ambulatory Visit: Payer: Self-pay | Admitting: Internal Medicine

## 2013-04-06 NOTE — Telephone Encounter (Signed)
Sent renewal for aricept back to [facility],,,/lmb

## 2013-04-06 NOTE — Telephone Encounter (Signed)
Pt's husband called to let us know she is out of the aricept.  He wants to know if it can be called in today.

## 2013-04-14 ENCOUNTER — Encounter (INDEPENDENT_AMBULATORY_CARE_PROVIDER_SITE_OTHER): Payer: Self-pay

## 2013-04-14 DIAGNOSIS — Z0289 Encounter for other administrative examinations: Secondary | ICD-10-CM

## 2013-04-24 ENCOUNTER — Encounter: Payer: Self-pay | Admitting: Internal Medicine

## 2013-05-11 ENCOUNTER — Encounter (INDEPENDENT_AMBULATORY_CARE_PROVIDER_SITE_OTHER): Payer: Self-pay

## 2013-05-11 DIAGNOSIS — Z0289 Encounter for other administrative examinations: Secondary | ICD-10-CM

## 2013-05-18 ENCOUNTER — Other Ambulatory Visit (INDEPENDENT_AMBULATORY_CARE_PROVIDER_SITE_OTHER): Payer: MEDICARE

## 2013-05-18 ENCOUNTER — Ambulatory Visit (INDEPENDENT_AMBULATORY_CARE_PROVIDER_SITE_OTHER): Payer: MEDICARE | Admitting: Internal Medicine

## 2013-05-18 ENCOUNTER — Encounter: Payer: Self-pay | Admitting: Internal Medicine

## 2013-05-18 ENCOUNTER — Other Ambulatory Visit: Payer: Self-pay | Admitting: Internal Medicine

## 2013-05-18 VITALS — BP 160/80 | HR 76 | Temp 98.4°F | Resp 16 | Ht 62.0 in | Wt 145.0 lb

## 2013-05-18 DIAGNOSIS — I10 Essential (primary) hypertension: Secondary | ICD-10-CM | POA: Diagnosis not present

## 2013-05-18 DIAGNOSIS — Z129 Encounter for screening for malignant neoplasm, site unspecified: Secondary | ICD-10-CM | POA: Diagnosis not present

## 2013-05-18 DIAGNOSIS — Z0000 Encounter for general adult medical examination without abnormal findings: Secondary | ICD-10-CM | POA: Diagnosis not present

## 2013-05-18 DIAGNOSIS — R202 Paresthesia of skin: Secondary | ICD-10-CM

## 2013-05-18 DIAGNOSIS — F068 Other specified mental disorders due to known physiological condition: Secondary | ICD-10-CM | POA: Diagnosis not present

## 2013-05-18 DIAGNOSIS — R209 Unspecified disturbances of skin sensation: Secondary | ICD-10-CM

## 2013-05-18 DIAGNOSIS — F329 Major depressive disorder, single episode, unspecified: Secondary | ICD-10-CM | POA: Diagnosis not present

## 2013-05-18 DIAGNOSIS — F3289 Other specified depressive episodes: Secondary | ICD-10-CM

## 2013-05-18 DIAGNOSIS — E785 Hyperlipidemia, unspecified: Secondary | ICD-10-CM | POA: Diagnosis not present

## 2013-05-18 DIAGNOSIS — Z23 Encounter for immunization: Secondary | ICD-10-CM

## 2013-05-18 LAB — BASIC METABOLIC PANEL
BUN: 22 mg/dL (ref 6–23)
CO2: 32 meq/L (ref 19–32)
Calcium: 9.2 mg/dL (ref 8.4–10.5)
Chloride: 105 mEq/L (ref 96–112)
Creatinine, Ser: 0.9 mg/dL (ref 0.4–1.2)
GFR: 80.29 mL/min (ref >60.00)
Glucose, Bld: 95 mg/dL (ref 70–99)
Potassium: 3.8 mEq/L (ref 3.5–5.1)
SODIUM: 141 meq/L (ref 135–145)

## 2013-05-18 LAB — HEPATIC FUNCTION PANEL
ALT: 14 U/L (ref 0–35)
AST: 21 U/L (ref 0–37)
Albumin: 3.8 g/dL (ref 3.5–5.2)
Alkaline Phosphatase: 82 U/L (ref 39–117)
BILIRUBIN DIRECT: 0.1 mg/dL (ref 0.0–0.3)
BILIRUBIN TOTAL: 0.7 mg/dL (ref 0.3–1.2)
Total Protein: 7 g/dL (ref 6.0–8.3)

## 2013-05-18 LAB — URINALYSIS, ROUTINE W REFLEX MICROSCOPIC
BILIRUBIN URINE: NEGATIVE
Ketones, ur: NEGATIVE
Nitrite: NEGATIVE
Specific Gravity, Urine: >=1.03 — AB (ref 1.000–1.030)
URINE GLUCOSE: NEGATIVE
UROBILINOGEN UA: 0.2 (ref 0.0–1.0)
pH: 5.5 (ref 5.0–8.0)

## 2013-05-18 LAB — TSH: TSH: 2.88 u[IU]/mL (ref 0.35–5.50)

## 2013-05-18 LAB — LIPID PANEL
CHOL/HDL RATIO: 3
CHOLESTEROL: 166 mg/dL (ref 0–200)
HDL: 51.9 mg/dL (ref >39.00)
LDL CALC: 92 mg/dL (ref 0–99)
Triglycerides: 111 mg/dL (ref 0.0–149.0)
VLDL: 22.2 mg/dL (ref 0.0–40.0)

## 2013-05-18 LAB — CBC WITH DIFFERENTIAL/PLATELET
BASOS ABS: 0 10*3/uL (ref 0.0–0.1)
Basophils Relative: 0.4 % (ref 0.0–3.0)
EOS ABS: 0.1 10*3/uL (ref 0.0–0.7)
Eosinophils Relative: 1.7 % (ref 0.0–5.0)
HEMATOCRIT: 40.2 % (ref 36.0–46.0)
HEMOGLOBIN: 13.1 g/dL (ref 12.0–15.0)
LYMPHS ABS: 1.7 10*3/uL (ref 0.7–4.0)
LYMPHS PCT: 23.6 % (ref 12.0–46.0)
MCHC: 32.5 g/dL (ref 30.0–36.0)
MCV: 83.4 fl (ref 78.0–100.0)
MONOS PCT: 6.9 % (ref 3.0–12.0)
Monocytes Absolute: 0.5 10*3/uL (ref 0.1–1.0)
Neutro Abs: 4.9 10*3/uL (ref 1.4–7.7)
Neutrophils Relative %: 67.4 % (ref 43.0–77.0)
PLATELETS: 178 10*3/uL (ref 150.0–400.0)
RBC: 4.82 Mil/uL (ref 3.87–5.11)
RDW: 16 % — AB (ref 11.5–14.6)
WBC: 7.3 10*3/uL (ref 4.5–10.5)

## 2013-05-18 LAB — VITAMIN B12: Vitamin B-12: 334 pg/mL (ref 211–911)

## 2013-05-18 MED ORDER — DICLOFENAC SODIUM 1 % TD GEL: 4.0000 g | Freq: Four times a day (QID) | TRANSDERMAL | Status: DC

## 2013-05-18 MED ORDER — AMLODIPINE BESYLATE 5 MG PO TABS: 5.0000 mg | ORAL_TABLET | Freq: Every day | ORAL | Status: DC

## 2013-05-18 MED ORDER — TRIAMTERENE-HCTZ 37.5-25 MG PO TABS: 1.0000 | ORAL_TABLET | Freq: Every day | ORAL | Status: DC

## 2013-05-18 MED ORDER — CIPROFLOXACIN HCL 250 MG PO TABS: 250.0000 mg | ORAL_TABLET | Freq: Every day | ORAL | Status: DC

## 2013-05-18 NOTE — Progress Notes (Signed)
Pre visit review using our clinic review tool, if applicable. No additional management support is needed unless otherwise documented below in the visit note. 

## 2013-05-18 NOTE — Progress Notes (Signed)
   Subjective:   HPI  The patient is here for a wellness exam. F/u elev BP - states it is in 180s at home F/u on R chest wall pain post-VATS in 2010 by Dr Harvel QualeBurny - better. She had a mammo.  She is taking Gabapentin 1 a day - it is helping some.  Wt Readings from Last 3 Encounters:  05/18/13 145 lb (65.772 kg)  12/21/12 142 lb (64.411 kg)  12/22/11 141 lb (63.957 kg)   BP Readings from Last 3 Encounters:  05/18/13 160/80  12/21/12 122/78  12/22/11 140/80      Review of Systems  Constitutional: Negative for chills, activity change, appetite change, fatigue and unexpected weight change.  HENT: Negative for congestion, mouth sores and sinus pressure.   Eyes: Negative for visual disturbance.  Respiratory: Negative for cough and chest tightness.   Cardiovascular: Positive for chest pain (R).  Gastrointestinal: Negative for nausea and abdominal pain.  Genitourinary: Negative for frequency, difficulty urinating and vaginal pain.  Musculoskeletal: Negative for back pain and gait problem.  Skin: Negative for pallor and rash.  Neurological: Negative for dizziness, tremors, weakness, numbness and headaches.  Psychiatric/Behavioral: Negative for confusion and sleep disturbance.       Objective:   Physical Exam  Constitutional: She appears well-developed and well-nourished. No distress.  HENT:  Head: Normocephalic.  Right Ear: External ear normal.  Left Ear: External ear normal.  Nose: Nose normal.  Mouth/Throat: Oropharynx is clear and moist.  Eyes: Conjunctivae are normal. Pupils are equal, round, and reactive to light. Right eye exhibits no discharge. Left eye exhibits no discharge.  Neck: Normal range of motion. Neck supple. No JVD present. No tracheal deviation present. No thyromegaly present.  Cardiovascular: Normal rate, regular rhythm and normal heart sounds.   Pulmonary/Chest: No stridor. No respiratory distress. She has no wheezes.  Abdominal: Soft. Bowel sounds are  normal. She exhibits no distension and no mass. There is no tenderness. There is no rebound and no guarding.  Musculoskeletal: She exhibits no edema and no tenderness.  Lymphadenopathy:    She has no cervical adenopathy.  Neurological: She displays normal reflexes. No cranial nerve deficit. She exhibits normal muscle tone. Coordination normal.  Skin: No rash noted. No erythema.  vitiligo  Psychiatric: She has a normal mood and affect. Her behavior is normal. Judgment and thought content normal.        Lab Results  Component Value Date   WBC 8.4 08/31/2012   HGB 12.6 08/31/2012   HCT 38.3 08/31/2012   PLT 176.0 08/31/2012   GLUCOSE 96 08/31/2012   CHOL 167 08/31/2012   TRIG 85.0 08/31/2012   HDL 57.00 08/31/2012   LDLCALC 93 08/31/2012   ALT 14 08/31/2012   AST 21 08/31/2012   NA 143 08/31/2012   K 4.6 08/31/2012   CL 107 08/31/2012   CREATININE 0.9 08/31/2012   BUN 19 08/31/2012   CO2 36* 08/31/2012   TSH 2.00 08/31/2012   INR 1.1 10/17/2008   HGBA1C 5.7 08/31/2012     Assessment & Plan:

## 2013-05-18 NOTE — Assessment & Plan Note (Signed)
Risks associated with treatment noncompliance were discussed. Compliance was encouraged. Re-start Maxzide

## 2013-05-18 NOTE — Assessment & Plan Note (Addendum)
Here for medicare wellness/physical  Diet: heart healthy  Physical activity: sedentary  Depression/mood screen: negative  Hearing: intact to whispered voice  Visual acuity: grossly normal, performs annual eye exam  ADLs: capable  Fall risk: none  Home safety: good  Cognitive evaluation: intact to orientation, naming, recall and repetition  EOL planning: adv directives, full code/ I agree  I have personally reviewed and have noted  1. The patient's medical and social history  2. Their use of alcohol, tobacco or illicit drugs  3. Their current medications and supplements  4. The patient's functional ability including ADL's, fall risks, home safety risks and hearing or visual impairment.  5. Diet and physical activities  6. Evidence for depression or mood disorders    Today patient counseled on age appropriate routine health concerns for screening and prevention, each reviewed and up to date or declined. Immunizations reviewed and up to date or declined. Labs ordered and reviewed. Risk factors for depression reviewed and negative. Hearing function and visual acuity are intact. ADLs screened and addressed as needed. Functional ability and level of safety reviewed and appropriate. Education, counseling and referrals performed based on assessed risks today. Patient provided with a copy of personalized plan for preventive services.  Prevnar Mammogr Cologuard discussed

## 2013-05-18 NOTE — Assessment & Plan Note (Signed)
Doing well 

## 2013-05-18 NOTE — Assessment & Plan Note (Signed)
Continue with current prescription therapy as reflected on the Med list. On Aricept Suggested Namenda - they will check w/her Research program

## 2013-05-19 ENCOUNTER — Telehealth: Payer: Self-pay

## 2013-05-19 NOTE — Telephone Encounter (Signed)
Message copied by Noreene LarssonANDREWS, Kenyatta Keidel R on Fri May 19, 2013  9:16 AM ------      Message from: Tresa GarterPLOTNIKOV, ALEKSEI V      Created: Thu May 18, 2013  9:28 PM       Misty StanleyStacey, please, inform patient that all labs are normal except for a UTI      She needs to take an abx - emailed      Thx             ------

## 2013-05-19 NOTE — Telephone Encounter (Signed)
Patient has been informed of results and to pick up antibiotic.

## 2013-05-23 ENCOUNTER — Telehealth: Payer: Self-pay | Admitting: *Deleted

## 2013-05-23 MED ORDER — TRIAMCINOLONE ACETONIDE 0.5 % EX CREA: 1.0000 "application " | TOPICAL_CREAM | Freq: Three times a day (TID) | CUTANEOUS | Status: DC

## 2013-05-23 NOTE — Telephone Encounter (Signed)
pts husband called requesting Triamicolone Cream refill.  Medication not on med list.  Please advise

## 2013-05-23 NOTE — Telephone Encounter (Signed)
Done. Thx.

## 2013-05-24 NOTE — Telephone Encounter (Signed)
Spoke with pts husband advised Rx sent 

## 2013-06-01 DIAGNOSIS — Z1231 Encounter for screening mammogram for malignant neoplasm of breast: Secondary | ICD-10-CM | POA: Diagnosis not present

## 2013-06-08 ENCOUNTER — Encounter (INDEPENDENT_AMBULATORY_CARE_PROVIDER_SITE_OTHER): Payer: Self-pay

## 2013-06-08 DIAGNOSIS — Z0289 Encounter for other administrative examinations: Secondary | ICD-10-CM

## 2013-06-14 ENCOUNTER — Ambulatory Visit: Payer: MEDICARE | Admitting: Internal Medicine

## 2013-06-14 DIAGNOSIS — Z0289 Encounter for other administrative examinations: Secondary | ICD-10-CM

## 2013-07-04 ENCOUNTER — Encounter: Payer: Self-pay | Admitting: Internal Medicine

## 2013-07-20 ENCOUNTER — Encounter: Payer: Self-pay | Admitting: Internal Medicine

## 2013-07-20 ENCOUNTER — Ambulatory Visit (INDEPENDENT_AMBULATORY_CARE_PROVIDER_SITE_OTHER): Payer: MEDICARE | Admitting: Internal Medicine

## 2013-07-20 VITALS — BP 132/74 | HR 72 | Temp 98.3°F | Resp 16 | Wt 146.0 lb

## 2013-07-20 DIAGNOSIS — F068 Other specified mental disorders due to known physiological condition: Secondary | ICD-10-CM

## 2013-07-20 DIAGNOSIS — I10 Essential (primary) hypertension: Secondary | ICD-10-CM

## 2013-07-20 MED ORDER — LORATADINE 10 MG PO TABS: 10.0000 mg | ORAL_TABLET | Freq: Every day | ORAL | Status: DC

## 2013-07-20 NOTE — Progress Notes (Signed)
Pre visit review using our clinic review tool, if applicable. No additional management support is needed unless otherwise documented below in the visit note. 

## 2013-07-20 NOTE — Assessment & Plan Note (Signed)
BP has normalized BP Readings from Last 3 Encounters:  07/20/13 132/74  05/18/13 160/80  12/21/12 122/78  Continue with current prescription therapy as reflected on the Med list.

## 2013-07-20 NOTE — Assessment & Plan Note (Signed)
Continue with current prescription therapy as reflected on the Med list.  Regular checks at [facility]

## 2013-07-20 NOTE — Progress Notes (Signed)
   Subjective:   HPI   F/u elev BP - states it is in 180s at home F/u on R chest wall pain post-VATS in 2010 by Dr Harvel Quale - better. She had a mammo.  She is taking Gabapentin 1 a day - it is helping some.  Wt Readings from Last 3 Encounters:  07/20/13 146 lb (66.225 kg)  05/18/13 145 lb (65.772 kg)  12/21/12 142 lb (64.411 kg)   BP Readings from Last 3 Encounters:  07/20/13 132/74  05/18/13 160/80  12/21/12 122/78      Review of Systems  Constitutional: Negative for chills, activity change, appetite change, fatigue and unexpected weight change.  HENT: Negative for congestion, mouth sores and sinus pressure.   Eyes: Negative for visual disturbance.  Respiratory: Negative for cough and chest tightness.   Cardiovascular: Positive for chest pain (R).  Gastrointestinal: Negative for nausea and abdominal pain.  Genitourinary: Negative for frequency, difficulty urinating and vaginal pain.  Musculoskeletal: Negative for back pain and gait problem.  Skin: Negative for pallor and rash.  Neurological: Negative for dizziness, tremors, weakness, numbness and headaches.  Psychiatric/Behavioral: Negative for confusion and sleep disturbance.       Objective:   Physical Exam  Constitutional: She appears well-developed and well-nourished. No distress.  HENT:  Head: Normocephalic.  Right Ear: External ear normal.  Left Ear: External ear normal.  Nose: Nose normal.  Mouth/Throat: Oropharynx is clear and moist.  Eyes: Conjunctivae are normal. Pupils are equal, round, and reactive to light. Right eye exhibits no discharge. Left eye exhibits no discharge.  Neck: Normal range of motion. Neck supple. No JVD present. No tracheal deviation present. No thyromegaly present.  Cardiovascular: Normal rate, regular rhythm and normal heart sounds.   Pulmonary/Chest: No stridor. No respiratory distress. She has no wheezes.  Abdominal: Soft. Bowel sounds are normal. She exhibits no distension and no  mass. There is no tenderness. There is no rebound and no guarding.  Musculoskeletal: She exhibits no edema and no tenderness.  Lymphadenopathy:    She has no cervical adenopathy.  Neurological: She displays normal reflexes. No cranial nerve deficit. She exhibits normal muscle tone. Coordination normal.  Skin: No rash noted. No erythema.  vitiligo  Psychiatric: She has a normal mood and affect. Her behavior is normal. Judgment and thought content normal.        Lab Results  Component Value Date   WBC 7.3 05/18/2013   HGB 13.1 05/18/2013   HCT 40.2 05/18/2013   PLT 178.0 05/18/2013   GLUCOSE 95 05/18/2013   CHOL 166 05/18/2013   TRIG 111.0 05/18/2013   HDL 51.90 05/18/2013   LDLCALC 92 05/18/2013   ALT 14 05/18/2013   AST 21 05/18/2013   NA 141 05/18/2013   K 3.8 05/18/2013   CL 105 05/18/2013   CREATININE 0.9 05/18/2013   BUN 22 05/18/2013   CO2 32 05/18/2013   TSH 2.88 05/18/2013   INR 1.1 10/17/2008   HGBA1C 5.7 08/31/2012     Assessment & Plan:

## 2013-07-21 ENCOUNTER — Telehealth: Payer: Self-pay | Admitting: Internal Medicine

## 2013-07-21 NOTE — Telephone Encounter (Signed)
Relevant patient education mailed to patient.  

## 2013-08-31 ENCOUNTER — Other Ambulatory Visit: Payer: Self-pay | Admitting: *Deleted

## 2013-08-31 DIAGNOSIS — G309 Alzheimer's disease, unspecified: Secondary | ICD-10-CM

## 2013-08-31 DIAGNOSIS — F0280 Dementia in other diseases classified elsewhere without behavioral disturbance: Secondary | ICD-10-CM

## 2013-08-31 DIAGNOSIS — R413 Other amnesia: Secondary | ICD-10-CM

## 2013-09-01 ENCOUNTER — Other Ambulatory Visit: Payer: Self-pay | Admitting: Internal Medicine

## 2013-09-07 ENCOUNTER — Ambulatory Visit
Admission: RE | Admit: 2013-09-07 | Discharge: 2013-09-07 | Disposition: A | Discharge status: Home / Self Care | Payer: No Typology Code available for payment source | Source: Ambulatory Visit | Attending: Neurology | Admitting: Neurology

## 2013-09-07 DIAGNOSIS — R413 Other amnesia: Secondary | ICD-10-CM

## 2013-09-07 DIAGNOSIS — F0280 Dementia in other diseases classified elsewhere without behavioral disturbance: Secondary | ICD-10-CM

## 2013-09-07 DIAGNOSIS — G309 Alzheimer's disease, unspecified: Secondary | ICD-10-CM

## 2013-10-17 ENCOUNTER — Encounter (HOSPITAL_COMMUNITY)
Admission: RE | Admit: 2013-10-17 | Discharge: 2013-10-17 | Disposition: A | Discharge status: Home / Self Care | Payer: Self-pay | Source: Ambulatory Visit | Attending: Neurology | Admitting: Neurology

## 2013-10-17 DIAGNOSIS — G309 Alzheimer's disease, unspecified: Secondary | ICD-10-CM | POA: Insufficient documentation

## 2013-10-17 DIAGNOSIS — R413 Other amnesia: Secondary | ICD-10-CM | POA: Insufficient documentation

## 2013-10-17 DIAGNOSIS — F0280 Dementia in other diseases classified elsewhere without behavioral disturbance: Secondary | ICD-10-CM | POA: Insufficient documentation

## 2013-10-19 ENCOUNTER — Encounter (INDEPENDENT_AMBULATORY_CARE_PROVIDER_SITE_OTHER): Payer: Self-pay

## 2013-10-19 DIAGNOSIS — Z0289 Encounter for other administrative examinations: Secondary | ICD-10-CM

## 2013-10-20 ENCOUNTER — Other Ambulatory Visit: Payer: Self-pay | Admitting: Neurology

## 2013-10-20 DIAGNOSIS — F0390 Unspecified dementia without behavioral disturbance: Secondary | ICD-10-CM

## 2013-10-26 ENCOUNTER — Other Ambulatory Visit: Payer: Self-pay

## 2013-10-31 ENCOUNTER — Ambulatory Visit
Admission: RE | Admit: 2013-10-31 | Discharge: 2013-10-31 | Disposition: A | Discharge status: Home / Self Care | Payer: No Typology Code available for payment source | Source: Ambulatory Visit | Attending: Neurology | Admitting: Neurology

## 2013-10-31 DIAGNOSIS — F0390 Unspecified dementia without behavioral disturbance: Secondary | ICD-10-CM

## 2013-11-09 ENCOUNTER — Ambulatory Visit (INDEPENDENT_AMBULATORY_CARE_PROVIDER_SITE_OTHER): Payer: Self-pay

## 2013-11-09 DIAGNOSIS — Z0289 Encounter for other administrative examinations: Secondary | ICD-10-CM

## 2013-11-09 DIAGNOSIS — F0391 Unspecified dementia with behavioral disturbance: Secondary | ICD-10-CM

## 2013-11-09 DIAGNOSIS — F03918 Unspecified dementia, unspecified severity, with other behavioral disturbance: Secondary | ICD-10-CM

## 2014-01-11 ENCOUNTER — Encounter (INDEPENDENT_AMBULATORY_CARE_PROVIDER_SITE_OTHER): Payer: Self-pay

## 2014-01-11 DIAGNOSIS — Z0289 Encounter for other administrative examinations: Secondary | ICD-10-CM

## 2014-01-17 ENCOUNTER — Ambulatory Visit: Payer: Self-pay | Admitting: Internal Medicine

## 2014-01-22 ENCOUNTER — Ambulatory Visit: Payer: Self-pay | Admitting: Family

## 2014-01-25 ENCOUNTER — Ambulatory Visit: Payer: Self-pay | Admitting: Internal Medicine

## 2014-01-25 ENCOUNTER — Encounter: Payer: Self-pay | Admitting: Internal Medicine

## 2014-01-25 ENCOUNTER — Ambulatory Visit (INDEPENDENT_AMBULATORY_CARE_PROVIDER_SITE_OTHER): Payer: MEDICARE | Admitting: Internal Medicine

## 2014-01-25 VITALS — BP 140/78 | HR 58 | Temp 98.2°F | Wt 142.0 lb

## 2014-01-25 DIAGNOSIS — Z23 Encounter for immunization: Secondary | ICD-10-CM

## 2014-01-25 DIAGNOSIS — F0280 Dementia in other diseases classified elsewhere without behavioral disturbance: Secondary | ICD-10-CM

## 2014-01-25 DIAGNOSIS — I10 Essential (primary) hypertension: Secondary | ICD-10-CM

## 2014-01-25 DIAGNOSIS — G309 Alzheimer's disease, unspecified: Secondary | ICD-10-CM | POA: Diagnosis not present

## 2014-01-25 DIAGNOSIS — M150 Primary generalized (osteo)arthritis: Secondary | ICD-10-CM | POA: Diagnosis not present

## 2014-01-25 DIAGNOSIS — M159 Polyosteoarthritis, unspecified: Secondary | ICD-10-CM

## 2014-01-25 MED ORDER — TRIAMCINOLONE ACETONIDE 0.5 % EX CREA: 1.0000 "application " | TOPICAL_CREAM | Freq: Three times a day (TID) | CUTANEOUS | Status: DC

## 2014-01-25 NOTE — Progress Notes (Signed)
   Subjective:   HPI   F/u HTN, vetiligo F/u on R chest wall pain post-VATS in 2010 by Dr Harvel QualeBurny - better. She had a mammo.  She is taking Gabapentin 1 a day - it is helping some.  Wt Readings from Last 3 Encounters:  01/25/14 142 lb (64.411 kg)  07/20/13 146 lb (66.225 kg)  05/18/13 145 lb (65.772 kg)   BP Readings from Last 3 Encounters:  01/25/14 140/78  07/20/13 132/74  05/18/13 160/80      Review of Systems  Constitutional: Negative for chills, activity change, appetite change, fatigue and unexpected weight change.  HENT: Negative for congestion, mouth sores and sinus pressure.   Eyes: Negative for visual disturbance.  Respiratory: Negative for cough and chest tightness.   Cardiovascular: Positive for chest pain (R).  Gastrointestinal: Negative for nausea and abdominal pain.  Genitourinary: Negative for frequency, difficulty urinating and vaginal pain.  Musculoskeletal: Negative for back pain and gait problem.  Skin: Negative for pallor and rash.  Neurological: Negative for dizziness, tremors, weakness, numbness and headaches.  Psychiatric/Behavioral: Negative for confusion and sleep disturbance.       Objective:   Physical Exam  Constitutional: She appears well-developed. No distress.  HENT:  Head: Normocephalic.  Right Ear: External ear normal.  Left Ear: External ear normal.  Nose: Nose normal.  Mouth/Throat: Oropharynx is clear and moist.  Eyes: Conjunctivae are normal. Pupils are equal, round, and reactive to light. Right eye exhibits no discharge. Left eye exhibits no discharge.  Neck: Normal range of motion. Neck supple. No JVD present. No tracheal deviation present. No thyromegaly present.  Cardiovascular: Normal rate, regular rhythm and normal heart sounds.   Pulmonary/Chest: No stridor. No respiratory distress. She has no wheezes.  Abdominal: Soft. Bowel sounds are normal. She exhibits no distension and no mass. There is no tenderness. There is no  rebound and no guarding.  Musculoskeletal: She exhibits no edema or tenderness.  Lymphadenopathy:    She has no cervical adenopathy.  Neurological: She displays normal reflexes. No cranial nerve deficit. She exhibits normal muscle tone. Coordination normal.  Skin: No rash noted. No erythema.  Psychiatric: She has a normal mood and affect. Her behavior is normal. Judgment and thought content normal.  vetiligo       Lab Results  Component Value Date   WBC 7.3 05/18/2013   HGB 13.1 05/18/2013   HCT 40.2 05/18/2013   PLT 178.0 05/18/2013   GLUCOSE 95 05/18/2013   CHOL 166 05/18/2013   TRIG 111.0 05/18/2013   HDL 51.90 05/18/2013   LDLCALC 92 05/18/2013   ALT 14 05/18/2013   AST 21 05/18/2013   NA 141 05/18/2013   K 3.8 05/18/2013   CL 105 05/18/2013   CREATININE 0.9 05/18/2013   BUN 22 05/18/2013   CO2 32 05/18/2013   TSH 2.88 05/18/2013   INR 1.1 10/17/2008   HGBA1C 5.7 08/31/2012     Assessment & Plan:

## 2014-01-25 NOTE — Assessment & Plan Note (Signed)
Continue with current prescription therapy as reflected on the Med list.  

## 2014-01-25 NOTE — Progress Notes (Signed)
Pre visit review using our clinic review tool, if applicable. No additional management support is needed unless otherwise documented below in the visit note. 

## 2014-01-25 NOTE — Assessment & Plan Note (Signed)
Chronic  Continue with current prescription therapy as reflected on the Med list.  

## 2014-01-26 ENCOUNTER — Ambulatory Visit: Payer: Self-pay | Admitting: Internal Medicine

## 2014-02-08 ENCOUNTER — Encounter (INDEPENDENT_AMBULATORY_CARE_PROVIDER_SITE_OTHER): Payer: Self-pay

## 2014-02-08 DIAGNOSIS — Z0289 Encounter for other administrative examinations: Secondary | ICD-10-CM

## 2014-03-08 ENCOUNTER — Encounter (INDEPENDENT_AMBULATORY_CARE_PROVIDER_SITE_OTHER): Payer: Self-pay

## 2014-03-08 DIAGNOSIS — Z0289 Encounter for other administrative examinations: Secondary | ICD-10-CM

## 2014-03-09 ENCOUNTER — Ambulatory Visit: Payer: Self-pay | Admitting: Internal Medicine

## 2014-03-26 ENCOUNTER — Telehealth: Payer: Self-pay | Admitting: *Deleted

## 2014-03-26 NOTE — Telephone Encounter (Signed)
[facility] - Client TELEPHONE ADVICE RECORD [facility] Patient Name: Courtney Day Gender: Female DOB: 1934/04/26 Age: [age] Y 1 M 6 D Return Phone Number: [phone] (Primary) Address: [address] [place]State[place]Zip: [place] [id number] Client [facility] - Client Client Site [facility] Night Physician Plotnikov, Alex Contact Type Call Call Type Triage / Clinical Relationship To Patient Self Return Phone Number [phone] (Primary) Chief Complaint Medication Question (non symptomatic) Initial Comment Caller states Serena ColonelLena Reed; husband told her there are two sizes in the bottle; 2 of her size, and some larger; has never had a large pink pill for blood pressure; another bottle in it with husband's meds; thought the same pill was for something else for her; no sx; Nurse Assessment Guidelines Guideline Title Affirmed Question Affirmed Notes Nurse Date/Time (Eastern Time) Disp. Time Lamount Cohen(Eastern Time) Disposition Final User 03/25/2014 12:53:34 PM Send To Clinical Follow Up Darrick PennaQueue Green, Amy 03/25/2014 1:44:44 PM Attempt made - no message left Joan Floresllen, RN, Grace 03/25/2014 2:48:12 PM FINAL ATTEMPT MADE - no message left Yes Freida BusmanAllen, RN, Delorise ShinerGrace After Care Instructions Given Call Event Type User Date / Time Description Comments User: Rudi RummageAmy, Green Date/Time Lamount Cohen(Eastern Time): 03/25/2014 12:53:26 PM Triamterene Hydrocholorithiazide 37.5/.25mg  is the name of the medication User: Su HoffGrace, Allen, RN Date/Time Lamount Cohen(Eastern Time): 03/25/2014 1:45:23 PM Attempted to call number given. Recording said that call could not be completed as dialed. Asked Lead PC to check number. User: Armando ReichertValesca, Morazan Date/Time (Eastern Time): 03/25/2014 1:56:35 PM Call reviewed PC got number from the white pages when asking the caller if that was her number she is it was correct. Nurse notified User: Su HoffGrace, Allen, RN Date/Time Lamount Cohen(Eastern Time): 03/25/2014  2:50:23 PM Lead PC reports that the caller didn't know her number, so the PC got the number from yellow pages on the Internet and caller said that was her number.Unable to get correct phone number.

## 2014-04-12 ENCOUNTER — Encounter (INDEPENDENT_AMBULATORY_CARE_PROVIDER_SITE_OTHER): Payer: Self-pay

## 2014-04-12 DIAGNOSIS — Z0289 Encounter for other administrative examinations: Secondary | ICD-10-CM

## 2014-04-17 ENCOUNTER — Telehealth: Payer: Self-pay | Admitting: Internal Medicine

## 2014-04-17 MED ORDER — MELOXICAM 7.5 MG PO TABS: 7.5000 mg | ORAL_TABLET | Freq: Every day | ORAL | Status: DC

## 2014-04-17 NOTE — Telephone Encounter (Signed)
Notified pt spoke with husband gave md response..../lmb 

## 2014-04-17 NOTE — Telephone Encounter (Signed)
Patient began experiencing pain in right arm/ shoulder last night. She is requesting something for pain be called in since you have no availability until next week. Please call patient

## 2014-04-17 NOTE — Telephone Encounter (Signed)
Meloxicam Rx was emailed Keep OV Thx

## 2014-04-20 ENCOUNTER — Telehealth: Payer: Self-pay | Admitting: *Deleted

## 2014-04-20 NOTE — Telephone Encounter (Signed)
[facility] - Client TELEPHONE ADVICE RECORD [facility] Patient Name: Courtney RussianLENA Day Gender: Female DOB: 22-Dec-1934 Age: [age] Y 2 M Return Phone Number: Address: [place]Zip: [place] [facility] [facility] - Client Client Site [facility] Contact Type Call Caller Name Courtney RussianLena Bok Caller Phone Number unknown Relationship To Patient Self Is this call to report lab results? No Call Type General Information Initial Comment Caller states she is having pain on right side-under shoulder blade. Husband got on the line, because Courtney Day didn't know her number- he explained his wife has Dementia, and to please cancel the call request. General Information Type Other Nurse Assessment Guidelines Guideline Title Affirmed Question Affirmed Notes Nurse Date/Time (Eastern Time) Disp. Time Courtney Day(Eastern Time) Disposition Final User 04/19/2014 2:11:00 PM General Information Provided Yes Courtney AngelicaHowe, Courtney Day After Care Instructions Given Call Event Type User Date / Time Description

## 2014-04-23 ENCOUNTER — Ambulatory Visit: Payer: Self-pay | Admitting: Internal Medicine

## 2014-04-27 DIAGNOSIS — M792 Neuralgia and neuritis, unspecified: Secondary | ICD-10-CM | POA: Diagnosis not present

## 2014-04-27 DIAGNOSIS — F0280 Dementia in other diseases classified elsewhere without behavioral disturbance: Secondary | ICD-10-CM | POA: Diagnosis not present

## 2014-04-27 DIAGNOSIS — G301 Alzheimer's disease with late onset: Secondary | ICD-10-CM | POA: Diagnosis not present

## 2014-05-10 ENCOUNTER — Telehealth: Payer: Self-pay | Admitting: Neurology

## 2014-05-10 NOTE — Telephone Encounter (Signed)
I spoke to the patient about the office closing due to the water supply. Appointment was rescheduled for Thursday, 24MAR2016.

## 2014-05-17 ENCOUNTER — Encounter (INDEPENDENT_AMBULATORY_CARE_PROVIDER_SITE_OTHER): Payer: Self-pay

## 2014-05-17 DIAGNOSIS — Z0289 Encounter for other administrative examinations: Secondary | ICD-10-CM

## 2014-05-31 ENCOUNTER — Ambulatory Visit (INDEPENDENT_AMBULATORY_CARE_PROVIDER_SITE_OTHER): Payer: MEDICARE | Admitting: Internal Medicine

## 2014-05-31 ENCOUNTER — Encounter: Payer: Self-pay | Admitting: Internal Medicine

## 2014-05-31 ENCOUNTER — Other Ambulatory Visit (INDEPENDENT_AMBULATORY_CARE_PROVIDER_SITE_OTHER): Payer: MEDICARE

## 2014-05-31 VITALS — BP 139/80 | HR 57 | Wt 146.0 lb

## 2014-05-31 DIAGNOSIS — G309 Alzheimer's disease, unspecified: Secondary | ICD-10-CM | POA: Diagnosis not present

## 2014-05-31 DIAGNOSIS — R202 Paresthesia of skin: Secondary | ICD-10-CM

## 2014-05-31 DIAGNOSIS — M159 Polyosteoarthritis, unspecified: Secondary | ICD-10-CM

## 2014-05-31 DIAGNOSIS — I10 Essential (primary) hypertension: Secondary | ICD-10-CM | POA: Diagnosis not present

## 2014-05-31 DIAGNOSIS — M150 Primary generalized (osteo)arthritis: Secondary | ICD-10-CM

## 2014-05-31 DIAGNOSIS — R0789 Other chest pain: Secondary | ICD-10-CM | POA: Diagnosis not present

## 2014-05-31 DIAGNOSIS — F0280 Dementia in other diseases classified elsewhere without behavioral disturbance: Secondary | ICD-10-CM

## 2014-05-31 LAB — BASIC METABOLIC PANEL
BUN: 35 mg/dL — ABNORMAL HIGH (ref 6–23)
CALCIUM: 9.6 mg/dL (ref 8.4–10.5)
CO2: 32 mEq/L (ref 19–32)
CREATININE: 1.2 mg/dL (ref 0.40–1.20)
Chloride: 106 mEq/L (ref 96–112)
GFR: 55.98 mL/min — ABNORMAL LOW (ref >60.00)
GLUCOSE: 92 mg/dL (ref 70–99)
Potassium: 4.3 mEq/L (ref 3.5–5.1)
SODIUM: 142 meq/L (ref 135–145)

## 2014-05-31 LAB — TSH: TSH: 3.13 u[IU]/mL (ref 0.35–4.50)

## 2014-05-31 LAB — VITAMIN B12: VITAMIN B 12: 341 pg/mL (ref 211–911)

## 2014-05-31 MED ORDER — GABAPENTIN 100 MG PO CAPS: 100.0000 mg | ORAL_CAPSULE | Freq: Three times a day (TID) | ORAL | Status: DC

## 2014-05-31 NOTE — Progress Notes (Signed)
   Subjective:   HPI  3/16 - Pt is in dementia research - IV infusion drug monthly x 2.5 years. Labs q 3 mo.  F/u HTN, vetiligo  F/u on R chest wall burning pain post-VATS in 2010 by Dr Harvel QualeBurny - better.  She is not taking Gabapentin.  Wt Readings from Last 3 Encounters:  05/31/14 146 lb (66.225 kg)  01/25/14 142 lb (64.411 kg)  07/20/13 146 lb (66.225 kg)   BP Readings from Last 3 Encounters:  05/31/14 140/80  01/25/14 140/78  07/20/13 132/74      Review of Systems  Constitutional: Negative for chills, activity change, appetite change, fatigue and unexpected weight change.  HENT: Negative for congestion, mouth sores and sinus pressure.   Eyes: Negative for visual disturbance.  Respiratory: Negative for cough and chest tightness.   Cardiovascular: Positive for chest pain (R).  Gastrointestinal: Negative for nausea and abdominal pain.  Genitourinary: Negative for frequency, difficulty urinating and vaginal pain.  Musculoskeletal: Negative for back pain and gait problem.  Skin: Negative for pallor and rash.  Neurological: Negative for dizziness, tremors, weakness, numbness and headaches.  Psychiatric/Behavioral: Negative for confusion and sleep disturbance.       Objective:   Physical Exam  Constitutional: She appears well-developed. No distress.  HENT:  Head: Normocephalic.  Right Ear: External ear normal.  Left Ear: External ear normal.  Nose: Nose normal.  Mouth/Throat: Oropharynx is clear and moist.  Eyes: Conjunctivae are normal. Pupils are equal, round, and reactive to light. Right eye exhibits no discharge. Left eye exhibits no discharge.  Neck: Normal range of motion. Neck supple. No JVD present. No tracheal deviation present. No thyromegaly present.  Cardiovascular: Normal rate, regular rhythm and normal heart sounds.   Pulmonary/Chest: No stridor. No respiratory distress. She has no wheezes.  Abdominal: Soft. Bowel sounds are normal. She exhibits no  distension and no mass. There is no tenderness. There is no rebound and no guarding.  Musculoskeletal: She exhibits no edema or tenderness.  Lymphadenopathy:    She has no cervical adenopathy.  Neurological: She displays normal reflexes. No cranial nerve deficit. She exhibits normal muscle tone. Coordination normal.  Skin: No rash noted. No erythema.  Psychiatric: She has a normal mood and affect. Her behavior is normal. Judgment and thought content normal.  vetiligo       Lab Results  Component Value Date   WBC 7.3 05/18/2013   HGB 13.1 05/18/2013   HCT 40.2 05/18/2013   PLT 178.0 05/18/2013   GLUCOSE 95 05/18/2013   CHOL 166 05/18/2013   TRIG 111.0 05/18/2013   HDL 51.90 05/18/2013   LDLCALC 92 05/18/2013   ALT 14 05/18/2013   AST 21 05/18/2013   NA 141 05/18/2013   K 3.8 05/18/2013   CL 105 05/18/2013   CREATININE 0.9 05/18/2013   BUN 22 05/18/2013   CO2 32 05/18/2013   TSH 2.88 05/18/2013   INR 1.1 10/17/2008   HGBA1C 5.7 08/31/2012     Assessment & Plan:

## 2014-05-31 NOTE — Assessment & Plan Note (Signed)
3/16 - Pt is in dementia research - IV infusion drug monthly x 2.5 years. Labs q 3 mo.

## 2014-05-31 NOTE — Assessment & Plan Note (Signed)
Restart Gabapentin

## 2014-05-31 NOTE — Progress Notes (Signed)
Pre visit review using our clinic review tool, if applicable. No additional management support is needed unless otherwise documented below in the visit note. 

## 2014-05-31 NOTE — Assessment & Plan Note (Signed)
Meloxicam prn 

## 2014-05-31 NOTE — Assessment & Plan Note (Signed)
Doing well Amlodipine

## 2014-06-07 ENCOUNTER — Ambulatory Visit (INDEPENDENT_AMBULATORY_CARE_PROVIDER_SITE_OTHER): Payer: Self-pay | Admitting: Neurology

## 2014-06-07 ENCOUNTER — Telehealth: Payer: Self-pay | Admitting: Neurology

## 2014-06-07 DIAGNOSIS — F0280 Dementia in other diseases classified elsewhere without behavioral disturbance: Secondary | ICD-10-CM

## 2014-06-07 DIAGNOSIS — G309 Alzheimer's disease, unspecified: Secondary | ICD-10-CM

## 2014-06-07 NOTE — Progress Notes (Signed)
Patient seen today by research staff for scheduled Expedition 3 research study visit

## 2014-06-07 NOTE — Telephone Encounter (Signed)
I left a message to the patient's caregiver about discontinuing Gabapentin until [facility] communic[facility] with the patient's PCP.

## 2014-07-05 ENCOUNTER — Encounter (INDEPENDENT_AMBULATORY_CARE_PROVIDER_SITE_OTHER): Payer: Self-pay

## 2014-07-05 DIAGNOSIS — Z0289 Encounter for other administrative examinations: Secondary | ICD-10-CM

## 2014-07-23 ENCOUNTER — Other Ambulatory Visit: Payer: Self-pay | Admitting: Internal Medicine

## 2014-07-24 NOTE — Telephone Encounter (Signed)
Ok to Rf? Med was D/C'd. 

## 2014-07-26 ENCOUNTER — Ambulatory Visit (INDEPENDENT_AMBULATORY_CARE_PROVIDER_SITE_OTHER): Payer: Self-pay | Admitting: Neurology

## 2014-07-26 DIAGNOSIS — Z0289 Encounter for other administrative examinations: Secondary | ICD-10-CM

## 2014-07-26 DIAGNOSIS — G301 Alzheimer's disease with late onset: Principal | ICD-10-CM

## 2014-07-26 DIAGNOSIS — F0280 Dementia in other diseases classified elsewhere without behavioral disturbance: Secondary | ICD-10-CM

## 2014-08-30 ENCOUNTER — Encounter (INDEPENDENT_AMBULATORY_CARE_PROVIDER_SITE_OTHER): Payer: Self-pay

## 2014-08-30 DIAGNOSIS — Z0289 Encounter for other administrative examinations: Secondary | ICD-10-CM

## 2014-09-20 DIAGNOSIS — Z1231 Encounter for screening mammogram for malignant neoplasm of breast: Secondary | ICD-10-CM | POA: Diagnosis not present

## 2014-09-20 LAB — HM MAMMOGRAPHY

## 2014-09-21 ENCOUNTER — Encounter: Payer: Self-pay | Admitting: Internal Medicine

## 2014-09-27 ENCOUNTER — Encounter (INDEPENDENT_AMBULATORY_CARE_PROVIDER_SITE_OTHER): Payer: Self-pay

## 2014-09-27 DIAGNOSIS — Z0289 Encounter for other administrative examinations: Secondary | ICD-10-CM

## 2014-10-04 ENCOUNTER — Encounter: Payer: Self-pay | Admitting: Internal Medicine

## 2014-10-04 ENCOUNTER — Ambulatory Visit (INDEPENDENT_AMBULATORY_CARE_PROVIDER_SITE_OTHER): Payer: MEDICARE | Admitting: Internal Medicine

## 2014-10-04 VITALS — BP 130/76 | HR 62 | Wt 143.0 lb

## 2014-10-04 DIAGNOSIS — I10 Essential (primary) hypertension: Secondary | ICD-10-CM

## 2014-10-04 DIAGNOSIS — R0789 Other chest pain: Secondary | ICD-10-CM | POA: Diagnosis not present

## 2014-10-04 DIAGNOSIS — F4321 Adjustment disorder with depressed mood: Secondary | ICD-10-CM | POA: Diagnosis not present

## 2014-10-04 DIAGNOSIS — F0280 Dementia in other diseases classified elsewhere without behavioral disturbance: Secondary | ICD-10-CM

## 2014-10-04 DIAGNOSIS — G309 Alzheimer's disease, unspecified: Secondary | ICD-10-CM | POA: Diagnosis not present

## 2014-10-04 NOTE — Assessment & Plan Note (Signed)
Amlodipine.

## 2014-10-04 NOTE — Progress Notes (Signed)
   Subjective:   HPI  F/u dementia: Pt is in dementia research - IV infusion drug monthly x 3 years. Labs q 3 mo.  F/u HTN, vetiligo - better  F/u on R chest wall burning pain post-VATS in 2010 by Dr Harvel Quale - much better.  She is taking Gabapentin prn.  Wt Readings from Last 3 Encounters:  10/04/14 143 lb (64.864 kg)  05/31/14 146 lb (66.225 kg)  01/25/14 142 lb (64.411 kg)   BP Readings from Last 3 Encounters:  10/04/14 130/76  05/31/14 139/80  01/25/14 140/78      Review of Systems  Constitutional: Negative for chills, activity change, appetite change, fatigue and unexpected weight change.  HENT: Negative for congestion, mouth sores and sinus pressure.   Eyes: Negative for visual disturbance.  Respiratory: Negative for cough and chest tightness.   Cardiovascular: Positive for chest pain (R).  Gastrointestinal: Negative for nausea and abdominal pain.  Genitourinary: Negative for frequency, difficulty urinating and vaginal pain.  Musculoskeletal: Negative for back pain and gait problem.  Skin: Negative for pallor and rash.  Neurological: Negative for dizziness, tremors, weakness, numbness and headaches.  Psychiatric/Behavioral: Positive for decreased concentration. Negative for suicidal ideas, hallucinations, confusion and sleep disturbance. The patient is not nervous/anxious.        Objective:   Physical Exam  Constitutional: She appears well-developed. No distress.  HENT:  Head: Normocephalic.  Right Ear: External ear normal.  Left Ear: External ear normal.  Nose: Nose normal.  Mouth/Throat: Oropharynx is clear and moist.  Eyes: Conjunctivae are normal. Pupils are equal, round, and reactive to light. Right eye exhibits no discharge. Left eye exhibits no discharge.  Neck: Normal range of motion. Neck supple. No JVD present. No tracheal deviation present. No thyromegaly present.  Cardiovascular: Normal rate, regular rhythm and normal heart sounds.    Pulmonary/Chest: No stridor. No respiratory distress. She has no wheezes.  Abdominal: Soft. Bowel sounds are normal. She exhibits no distension and no mass. There is no tenderness. There is no rebound and no guarding.  Musculoskeletal: She exhibits no edema or tenderness.  Lymphadenopathy:    She has no cervical adenopathy.  Neurological: She displays normal reflexes. No cranial nerve deficit. She exhibits normal muscle tone. Coordination normal.  Skin: No rash noted. No erythema.  Psychiatric: She has a normal mood and affect. Her behavior is normal. Thought content normal.  vetiligo  Short term memory issues      Lab Results  Component Value Date   WBC 7.3 05/18/2013   HGB 13.1 05/18/2013   HCT 40.2 05/18/2013   PLT 178.0 05/18/2013   GLUCOSE 92 05/31/2014   CHOL 166 05/18/2013   TRIG 111.0 05/18/2013   HDL 51.90 05/18/2013   LDLCALC 92 05/18/2013   ALT 14 05/18/2013   AST 21 05/18/2013   NA 142 05/31/2014   K 4.3 05/31/2014   CL 106 05/31/2014   CREATININE 1.20 05/31/2014   BUN 35* 05/31/2014   CO2 32 05/31/2014   TSH 3.13 05/31/2014   INR 1.1 10/17/2008   HGBA1C 5.7 08/31/2012     Assessment & Plan:

## 2014-10-04 NOTE — Assessment & Plan Note (Signed)
Doing fair 

## 2014-10-04 NOTE — Progress Notes (Signed)
Pre visit review using our clinic review tool, if applicable. No additional management support is needed unless otherwise documented below in the visit note. 

## 2014-10-04 NOTE — Assessment & Plan Note (Signed)
8/16 better Gabapentin prn

## 2014-10-04 NOTE — Assessment & Plan Note (Signed)
Pt is in dementia research - IV infusion drug monthly x 3 years. Labs q 3 mo.

## 2014-10-18 ENCOUNTER — Telehealth: Payer: Self-pay | Admitting: Neurology

## 2014-10-18 NOTE — Telephone Encounter (Signed)
I called Darlyne Russian at her home number.  When I spoke with her, I asked her if I could have her husband's phone number to speak with him about her appointment.  She said her husband was in the church but that I could leave a message.  I called her husband on his cell phone number and left a message.  [name] would like for me to either reschedule Nakyia Acoff's visit (to an earlier date) or tell them that they may have to wait between 12pm-1pm.  This is due to a monthly research meeting that is scheduled for 12pm-1pm.

## 2014-10-18 NOTE — Telephone Encounter (Signed)
I spoke to [employer].  He stated that he specifically has requested that he only come on Thursdays for the research appointments.  He said that he will try to come 30 minutes early if we are able to squeeze him in earlier.  However, he would like to keep his original scheduled time for 9/1 at 9:30am.  I called [name] and relayed this message to him.

## 2014-10-25 ENCOUNTER — Encounter: Payer: MEDICARE | Admitting: Diagnostic Neuroimaging

## 2014-10-26 ENCOUNTER — Encounter: Payer: Self-pay | Admitting: Diagnostic Neuroimaging

## 2014-10-26 DIAGNOSIS — H35373 Puckering of macula, bilateral: Secondary | ICD-10-CM | POA: Diagnosis not present

## 2014-11-01 ENCOUNTER — Encounter (INDEPENDENT_AMBULATORY_CARE_PROVIDER_SITE_OTHER): Payer: Self-pay

## 2014-11-01 ENCOUNTER — Encounter (INDEPENDENT_AMBULATORY_CARE_PROVIDER_SITE_OTHER): Payer: Self-pay | Admitting: Diagnostic Neuroimaging

## 2014-11-01 DIAGNOSIS — Z0289 Encounter for other administrative examinations: Secondary | ICD-10-CM

## 2014-11-12 ENCOUNTER — Telehealth: Payer: Self-pay | Admitting: Internal Medicine

## 2014-11-12 NOTE — Telephone Encounter (Signed)
[facility] - Client TELEPHONE ADVICE RECORD   [facility]     Patient Name: Tsering Sax Client [employer] [facility] - Client    Client Site [facility]    Physician Plotnikov, Alex   Gender: Female Contact Type Call  DOB: Jul 14, 1934  Call Type Triage / Clinical  Age: 80 Y 8 M 25 D Relationship To Patient Self  Return Phone Number: [phone] (Primary) Return Phone Number [phone] (Primary)  Address:  Chief Complaint Itching  City/State/Zip: [place]  Initial Comment Caller states she is itching at the site of an injection she had. The area also has white spot in the area.     Nurse Assessment  Nurse: Logan Bores, RN, Melissa Date/Time (Eastern Time): 11/12/2014 3:21:26 PM  Confirm and document reason for call. If symptomatic, describe symptoms. ---Caller states she is itching at the site of an injection she had. The area also has white spot in the area.  Has the patient traveled out of the country within the last 30 days? ---Not Applicable  Does the patient require triage? ---Declined Triage  Please document clinical information provided and list any resource used. ---Caller reports his wife has a dermatology condition ( vitiligo) , wife has Alzheimers and spouse -Ivar Drape reports he does not think it is anything, that she calls " anytime its a full moon" with calls. Spouse reports his wife had an infusion two weeks ago but denies that his wife has had any injections . Declines any triage at this time. Caller advised to call back if he finds when he gets home that there is something and we will be happy to evaluate her symptoms. Caller verb. understood.    Guidelines      Guideline Title Affirmed Question Affirmed Notes Nurse Date/Time (Eastern Time)        Disp. Time Lamount Cohen Time) Disposition Final User                 After Care Instructions Given     Call Event Type User Date / Time Description

## 2014-11-15 ENCOUNTER — Encounter: Payer: MEDICARE | Admitting: Diagnostic Neuroimaging

## 2014-11-23 ENCOUNTER — Encounter (INDEPENDENT_AMBULATORY_CARE_PROVIDER_SITE_OTHER): Payer: Self-pay

## 2014-11-23 DIAGNOSIS — Z0289 Encounter for other administrative examinations: Secondary | ICD-10-CM

## 2014-12-13 ENCOUNTER — Encounter (INDEPENDENT_AMBULATORY_CARE_PROVIDER_SITE_OTHER): Payer: Self-pay

## 2014-12-13 DIAGNOSIS — Z0289 Encounter for other administrative examinations: Secondary | ICD-10-CM

## 2015-01-10 ENCOUNTER — Encounter (INDEPENDENT_AMBULATORY_CARE_PROVIDER_SITE_OTHER): Payer: Self-pay | Admitting: Diagnostic Neuroimaging

## 2015-01-10 DIAGNOSIS — Z0289 Encounter for other administrative examinations: Secondary | ICD-10-CM

## 2015-01-29 ENCOUNTER — Other Ambulatory Visit: Payer: Self-pay | Admitting: Neurology

## 2015-01-29 DIAGNOSIS — G301 Alzheimer's disease with late onset: Principal | ICD-10-CM

## 2015-01-29 DIAGNOSIS — F0280 Dementia in other diseases classified elsewhere without behavioral disturbance: Secondary | ICD-10-CM

## 2015-01-30 ENCOUNTER — Telehealth: Payer: Self-pay | Admitting: Neurology

## 2015-01-30 NOTE — Telephone Encounter (Signed)
I called Courtney Day.  I let him know that the Expedition trial is closing and that we will have to re-schedule Courtney Day for an Early Discontinuation visit.  I also let him know that Courtney Day will need an MRI and PET scan before her ED visit.  Courtney Day said that any Thursday except Jan. 19th will work.  I told him I will call him back with the MRI and PET Scan dates.

## 2015-02-11 ENCOUNTER — Telehealth: Payer: Self-pay | Admitting: Neurology

## 2015-02-11 NOTE — Telephone Encounter (Signed)
I left a message for [employer]ie Mcgranahan.  I let him know that I scheduled Thomasenia SalesLena Livecchi's MRI for January 20th, 2017 at 10:40am.  I asked if he could call me back on my direct number to confirm this appointment.

## 2015-02-13 ENCOUNTER — Telehealth: Payer: Self-pay | Admitting: Neurology

## 2015-02-13 NOTE — Telephone Encounter (Signed)
I left a message for Ch[employer] CorporationWillie Day.  I asked if he could call me back so that I could let him know the scheduled dates for the MRI and PET Scan for Courtney RussianLena Day as well as schedule his Early Discontinuation visit for the research study.  I left my direct number for him to reach me.

## 2015-02-13 NOTE — Telephone Encounter (Signed)
I spoke with Courtney Day.  He expressed hesitation as well as questioned the reason for his wife, Courtney Day, to continue with the research study.  He also expressed concern about the coordinator, Andi HenceRizwan Sabir, and how he has conducted the study.  He also felt like he would not be able to communicate this to [name] over the phone.  [name] requested a face to face meeting with Andi HenceRizwan Sabir before he would be willing to continue the study.  He also requested a telephone call from Dr. Pearlean BrownieSethi before he would be willing to continue.  He requested that Dr. Pearlean BrownieSethi not call him today as today is his vacation day.  I set up a tentative meeting time with [name] (Dec. 29th at 11:00am) and will message Dr. Pearlean BrownieSethi his request to speak with him.

## 2015-02-19 ENCOUNTER — Telehealth: Payer: Self-pay | Admitting: Neurology

## 2015-02-19 NOTE — Telephone Encounter (Signed)
I returned a message left from [facility] Day.  I confirmed the location of Courtney SalesLena Day's next MRI appointment.  The appointment is scheduled for Jan. 19, 2017 at 10:20am (arrival at 10am[facility].  I also let him know the date of her PET Scan ([facility]  He said that he spoke with Dr. Pearlean BrownieSethi and would like to continue with the MRI.  He stated that he still wanted to keep his meeting with [name].  I reminded him of the meeting date which is 02/21/2015 at 11:00am.  He asked me about the early discontinuation visit time.  I let him know that the final visit will take 4 to 5 hours.  He expressed that he did not want the final visit to take that long.  He asked about what all needs to happen for the early discontinuation visit.  I told him that [name] would know the answer and that he could ask him those specific questions during the meeting.  Courtney HeadsWillie Day also asked to make sure that the MRI people did not bill his Medicaid for the procedures.  I let him know that when I made the appointment, that I told them to bill [facility] and that I would remind them again.  Courtney HeadsWillie Day thanked me for helping him.

## 2015-03-06 ENCOUNTER — Telehealth: Payer: Self-pay | Admitting: Neurology

## 2015-03-06 NOTE — Telephone Encounter (Signed)
I spoke with Herminio HeadsWillie Choung.  He left a message stating that he needed to reschedule Thomasenia SalesLena Clausen's MRI appointment [facility].  The MRI appointment was rescheduled for 03/15/15 at 11:00am.  I asked Mr. Azucena KubaReid if he wanted to schedule Thomasenia SalesLena Mell's final visit appointment.  He said that he would call me back and schedule an appointment once he was sure of his schedule.

## 2015-03-14 ENCOUNTER — Other Ambulatory Visit: Payer: Self-pay

## 2015-03-15 ENCOUNTER — Ambulatory Visit
Admission: RE | Admit: 2015-03-15 | Discharge: 2015-03-15 | Disposition: A | Discharge status: Home / Self Care | Payer: No Typology Code available for payment source | Source: Ambulatory Visit | Attending: Neurology | Admitting: Neurology

## 2015-03-15 DIAGNOSIS — G301 Alzheimer's disease with late onset: Principal | ICD-10-CM

## 2015-03-15 DIAGNOSIS — F0280 Dementia in other diseases classified elsewhere without behavioral disturbance: Secondary | ICD-10-CM

## 2015-03-20 ENCOUNTER — Telehealth: Payer: Self-pay

## 2015-03-20 ENCOUNTER — Telehealth: Payer: Self-pay | Admitting: Neurology

## 2015-03-20 NOTE — Telephone Encounter (Signed)
I spoke with Courtney Day.  I told him that my reason for calling was to collect some information from him about Courtney Day for tax purposes [phone] form).   The 1099 from was being sent to him due to his participation in the [place] study.  Courtney Day told me that he would like to speak with Courtney Day directly before he gave me that information.  I told him that I would relay this message to Courtney Day.

## 2015-03-20 NOTE — Telephone Encounter (Signed)
LFt vm for patients husband Pamella Samons about his wife MRI results. PT has alzheimer's.

## 2015-03-20 NOTE — Telephone Encounter (Signed)
-----   Message from Micki Riley, MD sent at 03/19/2015  8:46 PM EST ----- Kindly inform patient that MRI brain showed expected age related findings of shrinkage of brain. No acute , new or worrisome findings

## 2015-03-28 ENCOUNTER — Telehealth: Payer: Self-pay | Admitting: Neurology

## 2015-03-28 ENCOUNTER — Telehealth: Payer: Self-pay

## 2015-03-28 ENCOUNTER — Encounter: Payer: Self-pay | Admitting: Neurology

## 2015-03-28 ENCOUNTER — Encounter (HOSPITAL_COMMUNITY): Payer: MEDICARE

## 2015-03-28 NOTE — Telephone Encounter (Signed)
03/27/2015, i requested Adela Lank to contact Herminio Heads and confirm 03/28/2015's PET scan appointment. So i can meet them at [facility] as promised. According to Annice Pih, Mr. Frazzini told her that he has already told Keyen Marban not to call him any more. Re: PET he said we will see. He didinot confirm whether his wife will keep the appointment or not.

## 2015-03-28 NOTE — Telephone Encounter (Signed)
1st call - Delene Morais called the office today.  He stated that he could not get Darlyne Russian to come to her appointment (PET Scan). He asked if I would speak to [employer].  I spoke to [employer].  She stated that no one told her about the appointment.  I apologized to her about no one talking to her about the appointment.  I told her that I had spoken to her husband several times about the appointment and that he should have spoken with her.  I asked her if she wanted me to reschedule.  Ivar Drape told her that we could not reschedule because he had an appointment today.  Aashka hesitantly agreed to come to the appointment today.    2nd call - Betsy Rosello called back again.  He stated that he would not be able to get Helane Briceno to her appointment today.  I asked him if he would like to reschedule.  He said no.  He said that he has made a reasonable effort to try to get Enisa to her appointments and he did not want to reschedule.  I asked him again (for clarification) if he wanted to just cancel the PET scan or all of her appointments.  He said that he wanted to cancel all of her appointments.  I let Ivar Drape know that I would let [name] know that he wanted to cancel all appointments for [employer].  Ivar Drape told me tell Ephriam Knuckles "not to take any vacations during the summer" and that [name] could call him again if need be.  I thanked Harveen Flesch (and [place]) for his participation in the research study.

## 2015-03-28 NOTE — Telephone Encounter (Signed)
*  addendum to the previous note for 03/28/15* - Per [name]'s request  When I spoke to Herminio Heads, he stated to tell Ephriam Knuckles " to not take any more summer vacations".  He said to tell [name] that "he still hates him but he can call him back".  He then chuckled and said that he was "just joking" but that [name] could call him back.  He then thanked me and the department for all of our help.

## 2015-03-28 NOTE — Telephone Encounter (Signed)
I Spoke to the subject's husband ( 03/20/2015) and caregiver. Requested SSN & address for (1099) Tax purposes as requested by the accountant.  He was very much upset , i explained that these are the [place]. Regulations , nothing is coming from my side. He kept refusing. I  informed the accountant of his decision via email on the same day.

## 2015-04-01 ENCOUNTER — Telehealth: Payer: Self-pay

## 2015-04-01 NOTE — Telephone Encounter (Signed)
[name] Sabir and I spoke to the patient's Caregiver, Mr. Troiano, in regards to the [place] study. We explained the importance of having the PET scan, but Mr. Lave expressed that the patient had already refused to get the PET scan, and that it was unlikely that she would agree to have it rescheduled. Therefore, we kindly asked them to come for the Early Discontinuation visit on [id number] at 10:00h. Mr. Decelle agreed to come and expressed appreciation.

## 2015-04-01 NOTE — Telephone Encounter (Signed)
Rn talk to patients husband Ivar Drape on [place] form.RN stated pts MRI showed expected age related findings of shrinkage of the brain,no acute findings. Pts husband verbalized understanding.

## 2015-04-04 ENCOUNTER — Encounter (INDEPENDENT_AMBULATORY_CARE_PROVIDER_SITE_OTHER): Payer: MEDICARE | Admitting: Diagnostic Neuroimaging

## 2015-04-04 ENCOUNTER — Encounter: Payer: Self-pay | Admitting: Diagnostic Neuroimaging

## 2015-04-04 ENCOUNTER — Telehealth: Payer: Self-pay | Admitting: Neurology

## 2015-04-04 DIAGNOSIS — Z0289 Encounter for other administrative examinations: Secondary | ICD-10-CM

## 2015-04-04 NOTE — Telephone Encounter (Signed)
I received a call from [employer].  He said that him and Kinnedy Mongiello would be about 15-20 minutes late for their Research appointment.  He said that he is still working to get [employer] and ready.  I told him that it would be no problem and that we look forward to seeing him here.  He said thank you.

## 2015-04-11 ENCOUNTER — Encounter: Payer: Self-pay | Admitting: Internal Medicine

## 2015-04-11 ENCOUNTER — Ambulatory Visit (INDEPENDENT_AMBULATORY_CARE_PROVIDER_SITE_OTHER): Payer: MEDICARE | Admitting: Internal Medicine

## 2015-04-11 VITALS — BP 158/80 | HR 57 | Wt 149.0 lb

## 2015-04-11 DIAGNOSIS — I10 Essential (primary) hypertension: Secondary | ICD-10-CM

## 2015-04-11 DIAGNOSIS — Z23 Encounter for immunization: Secondary | ICD-10-CM | POA: Diagnosis not present

## 2015-04-11 DIAGNOSIS — J4520 Mild intermittent asthma, uncomplicated: Secondary | ICD-10-CM | POA: Diagnosis not present

## 2015-04-11 DIAGNOSIS — F0280 Dementia in other diseases classified elsewhere without behavioral disturbance: Secondary | ICD-10-CM

## 2015-04-11 DIAGNOSIS — G301 Alzheimer's disease with late onset: Secondary | ICD-10-CM | POA: Diagnosis not present

## 2015-04-11 NOTE — Progress Notes (Signed)
Subjective:  Patient ID: Courtney Day, female    DOB: November 22, 1934  Age: 81 y.o. MRN: [id number]  CC: No chief complaint on file.   HPI TORY MCKISSACK presents for memory loss, HTN, allergies. NEUROLOGY STUDY HAS ENDED   Outpatient Prescriptions Prior to Visit  Medication Sig Dispense Refill  . amLODipine (NORVASC) 5 MG tablet Take 1 tablet (5 mg total) by mouth daily. 90 tablet 2  . aspirin 325 MG tablet Take by mouth.    Marland Kitchen b complex vitamins tablet Take 1 tablet by mouth daily.      . Cholecalciferol 1000 UNITS tablet Take 1,000 Units by mouth daily.      Marland Kitchen donepezil (ARICEPT) 10 MG tablet TAKE 1 TABLET BY MOUTH ONCE DAILY 90 tablet 3  . hydrochlorothiazide (HYDRODIURIL) 12.5 MG tablet TAKE 1 TABLET BY MOUTH EVERY MORNING 90 tablet 3  . loratadine (CLARITIN) 10 MG tablet Take 1 tablet (10 mg total) by mouth daily. 100 tablet 3  . triamcinolone cream (KENALOG) 0.5 % Apply 1 application topically 3 (three) times daily. 90 g 3  . triamterene-hydrochlorothiazide (MAXZIDE-25) 37.5-25 MG per tablet TAKE 1 TABLET BY MOUTH DAILY. 90 tablet 3  . vitamin E 1000 UNIT capsule Take by mouth.    . gabapentin (NEURONTIN) 100 MG capsule Take 1 capsule (100 mg total) by mouth 3 (three) times daily. (Patient not taking: Reported on 04/11/2015) 90 capsule 5   No facility-administered medications prior to visit.    ROS Review of Systems  Constitutional: Negative for chills, activity change, appetite change, fatigue and unexpected weight change.  HENT: Negative for congestion, mouth sores and sinus pressure.   Eyes: Negative for visual disturbance.  Respiratory: Negative for cough and chest tightness.   Gastrointestinal: Negative for nausea and abdominal pain.  Genitourinary: Negative for frequency, difficulty urinating and vaginal pain.  Musculoskeletal: Negative for back pain and gait problem.  Skin: Negative for pallor and rash.  Neurological: Negative for dizziness, tremors, weakness, numbness and  headaches.  Psychiatric/Behavioral: Positive for decreased concentration. Negative for suicidal ideas, behavioral problems and sleep disturbance.    Objective:  BP 158/80 mmHg  Pulse 57  Wt 149 lb (67.586 kg)  SpO2 97%  BP Readings from Last 3 Encounters:  04/11/15 158/80  10/04/14 130/76  05/31/14 139/80    Wt Readings from Last 3 Encounters:  04/11/15 149 lb (67.586 kg)  10/04/14 143 lb (64.864 kg)  05/31/14 146 lb (66.225 kg)    Physical Exam  Constitutional: She appears well-developed. No distress.  HENT:  Head: Normocephalic.  Right Ear: External ear normal.  Left Ear: External ear normal.  Nose: Nose normal.  Mouth/Throat: Oropharynx is clear and moist.  Eyes: Conjunctivae are normal. Pupils are equal, round, and reactive to light. Right eye exhibits no discharge. Left eye exhibits no discharge.  Neck: Normal range of motion. Neck supple. No JVD present. No tracheal deviation present. No thyromegaly present.  Cardiovascular: Normal rate, regular rhythm and normal heart sounds.   Pulmonary/Chest: No stridor. No respiratory distress. She has no wheezes.  Abdominal: Soft. Bowel sounds are normal. She exhibits no distension and no mass. There is no tenderness. There is no rebound and no guarding.  Musculoskeletal: She exhibits no edema or tenderness.  Lymphadenopathy:    She has no cervical adenopathy.  Neurological: She displays normal reflexes. No cranial nerve deficit. She exhibits normal muscle tone. Coordination normal.  Skin: No rash noted. No erythema.  Psychiatric: She has a normal mood  and affect. Her behavior is normal.    Lab Results  Component Value Date   WBC 7.3 05/18/2013   HGB 13.1 05/18/2013   HCT 40.2 05/18/2013   PLT 178.0 05/18/2013   GLUCOSE 92 05/31/2014   CHOL 166 05/18/2013   TRIG 111.0 05/18/2013   HDL 51.90 05/18/2013   LDLCALC 92 05/18/2013   ALT 14 05/18/2013   AST 21 05/18/2013   NA 142 05/31/2014   K 4.3 05/31/2014   CL 106  05/31/2014   CREATININE 1.20 05/31/2014   BUN 35* 05/31/2014   CO2 32 05/31/2014   TSH 3.13 05/31/2014   INR 1.1 10/17/2008   HGBA1C 5.7 08/31/2012    Mr Brain Wo Contrast  03/15/2015  [facility] NEUROIMAG[facility] REPORT STUDY DATE: 03/15/15 PATIENT NAME: JATON EILERS DOB: 13-Feb-1935 MRN: [id number] ORDERING CLINICIAN: Delia Heady, MD CLINICAL HISTORY: 81 year old female with dementia. LZAX study. EXAM: MRI brain (without) TECHNIQUE: MRI of the brain without contrast was obtained following LZAX study protocol. CONTRAST: no IMAGING SITE: [facility] [address] (1.5 Tesla MRI)  FINDINGS: No abnormal lesions are seen on diffusion-weighted views to suggest acute ischemia. The cortical sulci, fissures and cisterns are notable for mild perisylvian and moderate mesial temporal atrophy. Lateral, third and fourth ventricle are normal in size and appearance. No extra-axial fluid collections are seen. No evidence of mass effect or midline shift. Scattered periventricular and subcortical and pontine chronic small vessel ischemic disease. On sagittal views the posterior fossa, pituitary gland and corpus callosum are unremarkable. No evidence of intracranial hemorrhage on gradient-echo views. The orbits and their contents, paranasal sinuses and calvarium are notable for hyperostosis interna.  Intracranial flow voids are present.   03/15/2015  Abnormal MRI brain (without) demonstrating: 1. Scattered periventricular and subcortical and pontine chronic small vessel ischemic disease. 2. Mild perisylvian and moderate mesial temporal atrophy. 3. No acute findings. No intracerebral hemorrhage or amyloid related imaging abnormalities (ARIA). INTERPRETING PHYSICIAN: Suanne Marker, MD Certified in Neurology, Neurophysiology and Neuroimaging [facility] [address] [place], [place] [address] [phone]    Assessment & Plan:   Diagnoses and all orders for  this visit:  Late onset Alzheimer's disease without behavioral disturbance  Asthma, mild intermittent, uncomplicated  Essential hypertension  Need for prophylactic vaccination and inoculation against influenza -     Flu vaccine HIGH DOSE PF (Fluzone High dose)   I am having Ms. Mooradian maintain her Cholecalciferol, b complex vitamins, amLODipine, loratadine, triamcinolone cream, aspirin, vitamin E, gabapentin, donepezil, triamterene-hydrochlorothiazide, and hydrochlorothiazide.  No orders of the defined types were placed in this encounter.     Follow-up: Return in about 6 months (around 10/09/2015) for Wellness Exam.  Sonda Primes, MD

## 2015-04-11 NOTE — Assessment & Plan Note (Addendum)
Check BP at home - it has been good Amlodipine

## 2015-04-11 NOTE — Assessment & Plan Note (Signed)
Claritin prn 

## 2015-04-11 NOTE — Progress Notes (Signed)
Pre visit review using our clinic review tool, if applicable. No additional management support is needed unless otherwise documented below in the visit note. 

## 2015-04-11 NOTE — Assessment & Plan Note (Signed)
Regular checks at [facility]  Aricept Namenda intolerant  3/16 - Pt is in dementia research - IV infusion drug monthly x 3 years. Labs q 3 mo - study was stopped in 2017

## 2015-04-26 DIAGNOSIS — F0280 Dementia in other diseases classified elsewhere without behavioral disturbance: Secondary | ICD-10-CM | POA: Diagnosis not present

## 2015-04-26 DIAGNOSIS — M150 Primary generalized (osteo)arthritis: Secondary | ICD-10-CM | POA: Diagnosis not present

## 2015-04-26 DIAGNOSIS — G301 Alzheimer's disease with late onset: Secondary | ICD-10-CM | POA: Diagnosis not present

## 2015-08-12 ENCOUNTER — Telehealth: Payer: Self-pay | Admitting: Internal Medicine

## 2015-08-12 NOTE — Telephone Encounter (Signed)
Pt husband called in and wants to know if there is anything that Dr Macario Goldsplot can give pt to help her sleep at night.  Husbands states that she is up all night walking around and packing clothes and don't sleep.

## 2015-08-13 MED ORDER — QUETIAPINE FUMARATE 50 MG PO TABS: 50.0000 mg | ORAL_TABLET | Freq: Every day | ORAL | Status: DC

## 2015-08-13 NOTE — Telephone Encounter (Signed)
Pt's husband informed. OV is scheduled 10/24/15. I advised him to keep ROV or call us if they need us sooner.

## 2015-08-13 NOTE — Telephone Encounter (Signed)
Try low dose 50 mg Seroquel at [facility] R[facility] Thx

## 2015-08-17 ENCOUNTER — Other Ambulatory Visit: Payer: Self-pay | Admitting: Internal Medicine

## 2015-09-17 ENCOUNTER — Other Ambulatory Visit: Payer: Self-pay | Admitting: *Deleted

## 2015-09-17 MED ORDER — QUETIAPINE FUMARATE 50 MG PO TABS: 50.0000 mg | ORAL_TABLET | Freq: Every day | ORAL | 5 refills | Status: DC

## 2015-09-26 ENCOUNTER — Telehealth: Payer: Self-pay | Admitting: Neurology

## 2015-09-26 NOTE — Telephone Encounter (Signed)
Patient has been seen by research for alzheimer disease.Pt has never follow up with research this year per notes. Dr. Pearlean Brownie see telephone notes from research. Now he wants a letter.

## 2015-09-26 NOTE — Telephone Encounter (Signed)
Spouse/Caretaker [phone] called "requests Dr. Pearlean Brownie do a letter to establish capacity of handling her finances". Please call.

## 2015-09-27 NOTE — Telephone Encounter (Signed)
Yes I know this patient well. She has advanced alzheimer`s and is unable to handle her finances. Ok to do the letter

## 2015-10-08 NOTE — Telephone Encounter (Signed)
Letter done by Dr.Sethi. PTs husband requested a letter stated pt cannot handle finances. Pt has alzheimer disease.

## 2015-10-09 NOTE — Telephone Encounter (Signed)
Rn call patients husband [name] about the letter for his wife. PTs husband wanted a letter that his wife cannot handle finances. Pt has alzheimer disease.Letter will be at front desk and husband will pick up.

## 2015-10-16 ENCOUNTER — Telehealth: Payer: Self-pay | Admitting: Internal Medicine

## 2015-10-16 NOTE — Telephone Encounter (Signed)
States patient has an appointment for 8/24.  Spouse is requesting letter stating that patient is not able to take care of her finances and that he has been taking care of her for years. There is not a DPR on file but there is a Durable POA on file stating Courtney Day as POA.

## 2015-10-17 ENCOUNTER — Encounter: Payer: Self-pay | Admitting: Internal Medicine

## 2015-10-17 ENCOUNTER — Ambulatory Visit (INDEPENDENT_AMBULATORY_CARE_PROVIDER_SITE_OTHER): Payer: MEDICARE | Admitting: Internal Medicine

## 2015-10-17 ENCOUNTER — Other Ambulatory Visit (INDEPENDENT_AMBULATORY_CARE_PROVIDER_SITE_OTHER): Payer: MEDICARE

## 2015-10-17 VITALS — BP 120/70 | HR 58 | Temp 98.2°F | Ht 62.0 in | Wt 147.0 lb

## 2015-10-17 DIAGNOSIS — I10 Essential (primary) hypertension: Secondary | ICD-10-CM

## 2015-10-17 DIAGNOSIS — G301 Alzheimer's disease with late onset: Secondary | ICD-10-CM | POA: Diagnosis not present

## 2015-10-17 DIAGNOSIS — F0280 Dementia in other diseases classified elsewhere without behavioral disturbance: Secondary | ICD-10-CM | POA: Diagnosis not present

## 2015-10-17 DIAGNOSIS — Z23 Encounter for immunization: Secondary | ICD-10-CM | POA: Diagnosis not present

## 2015-10-17 DIAGNOSIS — F4321 Adjustment disorder with depressed mood: Secondary | ICD-10-CM

## 2015-10-17 LAB — HEPATIC FUNCTION PANEL
ALBUMIN: 4 g/dL (ref 3.5–5.2)
ALT: 9 U/L (ref 0–35)
AST: 18 U/L (ref 0–37)
Alkaline Phosphatase: 72 U/L (ref 39–117)
Bilirubin, Direct: 0 mg/dL (ref 0.0–0.3)
Total Bilirubin: 0.4 mg/dL (ref 0.2–1.2)
Total Protein: 7.4 g/dL (ref 6.0–8.3)

## 2015-10-17 LAB — TSH: TSH: 2.97 u[IU]/mL (ref 0.35–4.50)

## 2015-10-17 LAB — CBC WITH DIFFERENTIAL/PLATELET
Basophils Absolute: 0.1 10*3/uL (ref 0.0–0.1)
Basophils Relative: 0.6 % (ref 0.0–3.0)
EOS PCT: 1.9 % (ref 0.0–5.0)
Eosinophils Absolute: 0.2 10*3/uL (ref 0.0–0.7)
HCT: 36.1 % (ref 36.0–46.0)
Hemoglobin: 11.9 g/dL — ABNORMAL LOW (ref 12.0–15.0)
Lymphocytes Relative: 27.1 % (ref 12.0–46.0)
Lymphs Abs: 2.4 10*3/uL (ref 0.7–4.0)
MCHC: 33.1 g/dL (ref 30.0–36.0)
MCV: 82.4 fl (ref 78.0–100.0)
Monocytes Absolute: 0.6 10*3/uL (ref 0.1–1.0)
Monocytes Relative: 7 % (ref 3.0–12.0)
Neutro Abs: 5.7 10*3/uL (ref 1.4–7.7)
Neutrophils Relative %: 63.4 % (ref 43.0–77.0)
Platelets: 202 10*3/uL (ref 150.0–400.0)
RBC: 4.38 Mil/uL (ref 3.87–5.11)
RDW: 15.5 % (ref 11.5–15.5)
WBC: 8.9 10*3/uL (ref 4.0–10.5)

## 2015-10-17 LAB — BASIC METABOLIC PANEL
BUN: 25 mg/dL — ABNORMAL HIGH (ref 6–23)
CO2: 35 mEq/L — ABNORMAL HIGH (ref 19–32)
Calcium: 9.2 mg/dL (ref 8.4–10.5)
Chloride: 100 mEq/L (ref 96–112)
Creatinine, Ser: 1.36 mg/dL — ABNORMAL HIGH (ref 0.40–1.20)
GFR: 48.28 mL/min — AB (ref >60.00)
Glucose, Bld: 97 mg/dL (ref 70–99)
POTASSIUM: 3.9 meq/L (ref 3.5–5.1)
SODIUM: 141 meq/L (ref 135–145)

## 2015-10-17 NOTE — Progress Notes (Signed)
Pre visit review using our clinic review tool, if applicable. No additional management support is needed unless otherwise documented below in the visit note. 

## 2015-10-17 NOTE — Assessment & Plan Note (Addendum)
[facility]  Aricept Namenda intolerant Letter

## 2015-10-17 NOTE — Telephone Encounter (Signed)
Done. Thx.

## 2015-10-17 NOTE — Assessment & Plan Note (Addendum)
Amlodipine, HCTZ °

## 2015-10-17 NOTE — Progress Notes (Signed)
Subjective:  Patient ID: Courtney Day, female    DOB: 1934-06-04  Age: 81 y.o. MRN: [id number]  CC: Follow-up   HPI Courtney Day presents for HTN, dementia, depression f/u. C/o noncompliance w/Seroquel - not resting well. Needs a letter   Outpatient Medications Prior to Visit  Medication Sig Dispense Refill  . amLODipine (NORVASC) 5 MG tablet Take 1 tablet (5 mg total) by mouth daily. 90 tablet 2  . b complex vitamins tablet Take 1 tablet by mouth daily.      . Cholecalciferol 1000 UNITS tablet Take 1,000 Units by mouth daily.      Marland Kitchen. donepezil (ARICEPT) 10 MG tablet TAKE 1 TABLET BY MOUTH ONCE DAILY 90 tablet 3  . hydrochlorothiazide (HYDRODIURIL) 12.5 MG tablet TAKE 1 TABLET BY MOUTH EVERY MORNING 90 tablet 3  . loratadine (CLARITIN) 10 MG tablet Take 1 tablet (10 mg total) by mouth daily. 100 tablet 3  . QUEtiapine (SEROQUEL) 50 MG tablet Take 1 tablet (50 mg total) by mouth at bedtime. 30 tablet 5  . triamcinolone cream (KENALOG) 0.5 % Apply 1 application topically 3 (three) times daily. 90 g 3  . triamterene-hydrochlorothiazide (MAXZIDE-25) 37.5-25 MG per tablet TAKE 1 TABLET BY MOUTH DAILY. 90 tablet 3  . vitamin E 1000 UNIT capsule Take by mouth.    Marland Kitchen. aspirin 325 MG tablet Take by mouth.    . gabapentin (NEURONTIN) 100 MG capsule Take 1 capsule (100 mg total) by mouth 3 (three) times daily. (Patient not taking: Reported on 04/11/2015) 90 capsule 5   No facility-administered medications prior to visit.     ROS Review of Systems  Constitutional: Positive for fatigue. Negative for activity change, appetite change, chills and unexpected weight change.  HENT: Negative for congestion, mouth sores and sinus pressure.   Eyes: Negative for visual disturbance.  Respiratory: Negative for cough and chest tightness.   Gastrointestinal: Negative for abdominal pain and nausea.  Genitourinary: Negative for difficulty urinating, frequency and vaginal pain.  Musculoskeletal: Negative for back  pain and gait problem.  Skin: Negative for pallor and rash.  Neurological: Negative for dizziness, tremors, weakness, numbness and headaches.  Psychiatric/Behavioral: Positive for confusion, decreased concentration and sleep disturbance. The patient is nervous/anxious.     Objective:  Pulse (!) 58   Temp 98.2 F (36.8 C) (Oral)   Ht 5\' 2"  (1.575 m)   Wt 147 lb (66.7 kg)   SpO2 97%   BMI 26.89 kg/m   BP Readings from Last 3 Encounters:  04/11/15 (!) 158/80  10/04/14 130/76  05/31/14 139/80    Wt Readings from Last 3 Encounters:  10/17/15 147 lb (66.7 kg)  04/11/15 149 lb (67.6 kg)  10/04/14 143 lb (64.9 kg)    Physical Exam  Constitutional: She appears well-developed. No distress.  HENT:  Head: Normocephalic.  Right Ear: External ear normal.  Left Ear: External ear normal.  Nose: Nose normal.  Mouth/Throat: Oropharynx is clear and moist.  Eyes: Conjunctivae are normal. Pupils are equal, round, and reactive to light. Right eye exhibits no discharge. Left eye exhibits no discharge.  Neck: Normal range of motion. Neck supple. No JVD present. No tracheal deviation present. No thyromegaly present.  Cardiovascular: Normal rate, regular rhythm and normal heart sounds.   Pulmonary/Chest: No stridor. No respiratory distress. She has no wheezes.  Abdominal: Soft. Bowel sounds are normal. She exhibits no distension and no mass. There is no tenderness. There is no rebound and no guarding.  Musculoskeletal: She  exhibits no edema or tenderness.  Lymphadenopathy:    She has no cervical adenopathy.  Neurological: She displays normal reflexes. No cranial nerve deficit. She exhibits normal muscle tone. Coordination normal.  Skin: No rash noted. No erythema.  Psychiatric: She has a normal mood and affect. Judgment normal.    Lab Results  Component Value Date   WBC 7.3 05/18/2013   HGB 13.1 05/18/2013   HCT 40.2 05/18/2013   PLT 178.0 05/18/2013   GLUCOSE 92 05/31/2014   CHOL 166  05/18/2013   TRIG 111.0 05/18/2013   HDL 51.90 05/18/2013   LDLCALC 92 05/18/2013   ALT 14 05/18/2013   AST 21 05/18/2013   NA 142 05/31/2014   K 4.3 05/31/2014   CL 106 05/31/2014   CREATININE 1.20 05/31/2014   BUN 35 (H) 05/31/2014   CO2 32 05/31/2014   TSH 3.13 05/31/2014   INR 1.1 10/17/2008   HGBA1C 5.7 08/31/2012    Mr Brain Wo Contrast  Result Date: 03/15/2015 [facility] NEURO[facility] REPORT STUDY DATE: 03/15/15 PATIENT NAME: Courtney Day DOB: 1934/03/03 MRN: [id number] ORDERING CLINICIAN: Delia HeadyPramod Sethi, MD CLINICAL HISTORY: 81 year old female with dementia. LZAX study. EXAM: MRI brain (without) TECHNIQUE: MRI of the brain without contrast was obtained following LZAX study protocol. CONTRAST: no IMAGING SITE: [facility] [address] (1.5 Tesla MRI)  FINDINGS: No abnormal lesions are seen on diffusion-weighted views to suggest acute ischemia. The cortical sulci, fissures and cisterns are notable for mild perisylvian and moderate mesial temporal atrophy. Lateral, third and fourth ventricle are normal in size and appearance. No extra-axial fluid collections are seen. No evidence of mass effect or midline shift. Scattered periventricular and subcortical and pontine chronic small vessel ischemic disease. On sagittal views the posterior fossa, pituitary gland and corpus callosum are unremarkable. No evidence of intracranial hemorrhage on gradient-echo views. The orbits and their contents, paranasal sinuses and calvarium are notable for hyperostosis interna.  Intracranial flow voids are present.   Abnormal MRI brain (without) demonstrating: 1. Scattered periventricular and subcortical and pontine chronic small vessel ischemic disease. 2. Mild perisylvian and moderate mesial temporal atrophy. 3. No acute findings. No intracerebral hemorrhage or amyloid related imaging abnormalities (ARIA). INTERPRETING PHYSICIAN: Suanne MarkerVIKRAM R. PENUMALLI, MD Certified in Neurology,  Neurophysiology and Neuroimaging [facility] [address] [place], [place] [id number] [phone]    Assessment & Plan:   There are no diagnoses linked to this encounter. I am having Ms. Kindler maintain her Cholecalciferol, b complex vitamins, amLODipine, loratadine, triamcinolone cream, aspirin, vitamin E, gabapentin, triamterene-hydrochlorothiazide, hydrochlorothiazide, donepezil, QUEtiapine, and diclofenac sodium.  Meds ordered this encounter  Medications  . diclofenac sodium (VOLTAREN) 1 % GEL    Sig: Apply topically. Apply 2 g topically 2 times daily as needed.     Follow-up: No Follow-up on file.  Sonda PrimesAlex Plotnikov, MD

## 2015-10-17 NOTE — Assessment & Plan Note (Signed)
Discussed.

## 2015-10-17 NOTE — Addendum Note (Signed)
Addended by: Verdie ShireBAYNES, Adel Neyer M on: 10/17/2015 10:09 AM   Modules accepted: Orders

## 2015-10-23 ENCOUNTER — Other Ambulatory Visit: Payer: Self-pay | Admitting: Internal Medicine

## 2015-10-24 ENCOUNTER — Encounter: Payer: Self-pay | Admitting: Internal Medicine

## 2016-04-02 ENCOUNTER — Ambulatory Visit: Payer: Self-pay | Admitting: Internal Medicine

## 2016-04-14 ENCOUNTER — Other Ambulatory Visit (INDEPENDENT_AMBULATORY_CARE_PROVIDER_SITE_OTHER): Payer: MEDICARE

## 2016-04-14 ENCOUNTER — Ambulatory Visit (INDEPENDENT_AMBULATORY_CARE_PROVIDER_SITE_OTHER): Payer: MEDICARE | Admitting: Internal Medicine

## 2016-04-14 ENCOUNTER — Encounter: Payer: Self-pay | Admitting: Internal Medicine

## 2016-04-14 VITALS — BP 124/72 | HR 56 | Temp 98.4°F | Resp 16 | Ht 62.0 in | Wt 146.8 lb

## 2016-04-14 DIAGNOSIS — L80 Vitiligo: Secondary | ICD-10-CM | POA: Diagnosis not present

## 2016-04-14 DIAGNOSIS — G301 Alzheimer's disease with late onset: Secondary | ICD-10-CM

## 2016-04-14 DIAGNOSIS — E876 Hypokalemia: Secondary | ICD-10-CM

## 2016-04-14 DIAGNOSIS — I10 Essential (primary) hypertension: Secondary | ICD-10-CM | POA: Diagnosis not present

## 2016-04-14 DIAGNOSIS — F0280 Dementia in other diseases classified elsewhere without behavioral disturbance: Secondary | ICD-10-CM | POA: Diagnosis not present

## 2016-04-14 LAB — BASIC METABOLIC PANEL
BUN: 22 mg/dL (ref 6–23)
CALCIUM: 9.3 mg/dL (ref 8.4–10.5)
CHLORIDE: 104 meq/L (ref 96–112)
CO2: 31 mEq/L (ref 19–32)
Creatinine, Ser: 1.23 mg/dL — ABNORMAL HIGH (ref 0.40–1.20)
GFR: 54.15 mL/min — ABNORMAL LOW (ref >60.00)
Glucose, Bld: 85 mg/dL (ref 70–99)
Potassium: 3.6 mEq/L (ref 3.5–5.1)
SODIUM: 141 meq/L (ref 135–145)

## 2016-04-14 LAB — URINALYSIS, ROUTINE W REFLEX MICROSCOPIC
Bilirubin Urine: NEGATIVE
Hgb urine dipstick: NEGATIVE
Ketones, ur: NEGATIVE
Nitrite: NEGATIVE
RBC / HPF: NONE SEEN (ref 0–?)
Specific Gravity, Urine: 1.015 (ref 1.000–1.030)
TOTAL PROTEIN, URINE-UPE24: NEGATIVE
URINE GLUCOSE: NEGATIVE
UROBILINOGEN UA: 0.2 (ref 0.0–1.0)
pH: 5.5 (ref 5.0–8.0)

## 2016-04-14 MED ORDER — TRIAMCINOLONE ACETONIDE 0.5 % EX CREA: 1.0000 "application " | TOPICAL_CREAM | Freq: Three times a day (TID) | CUTANEOUS | 3 refills | Status: DC

## 2016-04-14 NOTE — Progress Notes (Signed)
Pre-visit discussion using our clinic review tool. No additional management support is needed unless otherwise documented below in the visit note.  

## 2016-04-14 NOTE — Progress Notes (Signed)
Subjective:  Patient ID: Courtney Day, female    DOB: January 08, 1935  Age: 82 y.o. MRN: [id number]  CC: Follow-up (HTN, Depression, Alzheimers stable )   HPI Courtney Day presents for HTN, dementia, vit D def f/u  Outpatient Medications Prior to Visit  Medication Sig Dispense Refill  . amLODipine (NORVASC) 5 MG tablet Take 1 tablet (5 mg total) by mouth daily. 90 tablet 2  . aspirin 325 MG tablet Take by mouth.    Marland Kitchen. b complex vitamins tablet Take 1 tablet by mouth daily.      . Cholecalciferol 1000 UNITS tablet Take 1,000 Units by mouth daily.      . diclofenac sodium (VOLTAREN) 1 % GEL Apply topically. Apply 2 g topically 2 times daily as needed.    . donepezil (ARICEPT) 10 MG tablet TAKE 1 TABLET BY MOUTH ONCE DAILY 90 tablet 3  . hydrochlorothiazide (HYDRODIURIL) 12.5 MG tablet TAKE 1 TABLET BY MOUTH EVERY MORNING 90 tablet 3  . loratadine (CLARITIN) 10 MG tablet Take 1 tablet (10 mg total) by mouth daily. 100 tablet 3  . QUEtiapine (SEROQUEL) 50 MG tablet Take 1 tablet (50 mg total) by mouth at bedtime. 30 tablet 5  . triamcinolone cream (KENALOG) 0.5 % Apply 1 application topically 3 (three) times daily. 90 g 3  . triamterene-hydrochlorothiazide (MAXZIDE-25) 37.5-25 MG tablet TAKE 1 TABLET BY MOUTH DAILY. 90 tablet 1  . vitamin E 1000 UNIT capsule Take by mouth.    . gabapentin (NEURONTIN) 100 MG capsule Take 1 capsule (100 mg total) by mouth 3 (three) times daily. (Patient not taking: Reported on 04/11/2015) 90 capsule 5   No facility-administered medications prior to visit.     ROS Review of Systems  Constitutional: Negative for activity change, appetite change, chills, fatigue and unexpected weight change.  HENT: Negative for congestion, mouth sores and sinus pressure.   Eyes: Negative for visual disturbance.  Respiratory: Negative for cough and chest tightness.   Gastrointestinal: Negative for abdominal pain and nausea.  Genitourinary: Negative for difficulty urinating,  frequency and vaginal pain.  Musculoskeletal: Negative for back pain and gait problem.  Skin: Negative for pallor and rash.  Neurological: Negative for dizziness, tremors, weakness, numbness and headaches.  Psychiatric/Behavioral: Positive for confusion and decreased concentration. Negative for sleep disturbance and suicidal ideas.    Objective:  BP 124/72   Pulse (!) 56   Temp 98.4 F (36.9 C) (Oral)   Resp 16   Ht 5\' 2"  (1.575 m)   Wt 146 lb 12 oz (66.6 kg)   SpO2 98%   BMI 26.84 kg/m   BP Readings from Last 3 Encounters:  04/14/16 124/72  10/17/15 120/70  04/11/15 (!) 158/80    Wt Readings from Last 3 Encounters:  04/14/16 146 lb 12 oz (66.6 kg)  10/17/15 147 lb (66.7 kg)  04/11/15 149 lb (67.6 kg)    Physical Exam  Constitutional: She appears well-developed. No distress.  HENT:  Head: Normocephalic.  Right Ear: External ear normal.  Left Ear: External ear normal.  Nose: Nose normal.  Mouth/Throat: Oropharynx is clear and moist.  Eyes: Conjunctivae are normal. Pupils are equal, round, and reactive to light. Right eye exhibits no discharge. Left eye exhibits no discharge.  Neck: Normal range of motion. Neck supple. No JVD present. No tracheal deviation present. No thyromegaly present.  Cardiovascular: Normal rate, regular rhythm and normal heart sounds.   Pulmonary/Chest: No stridor. No respiratory distress. She has no wheezes.  Abdominal:  Soft. Bowel sounds are normal. She exhibits no distension and no mass. There is no tenderness. There is no rebound and no guarding.  Musculoskeletal: She exhibits no edema or tenderness.  Lymphadenopathy:    She has no cervical adenopathy.  Neurological: She displays normal reflexes. No cranial nerve deficit. She exhibits normal muscle tone. Coordination normal.  Skin: No rash noted. No erythema.  Psychiatric: Her behavior is normal.    Lab Results  Component Value Date   WBC 8.9 10/17/2015   HGB 11.9 (L) 10/17/2015   HCT  36.1 10/17/2015   PLT 202.0 10/17/2015   GLUCOSE 97 10/17/2015   CHOL 166 05/18/2013   TRIG 111.0 05/18/2013   HDL 51.90 05/18/2013   LDLCALC 92 05/18/2013   ALT 9 10/17/2015   AST 18 10/17/2015   NA 141 10/17/2015   K 3.9 10/17/2015   CL 100 10/17/2015   CREATININE 1.36 (H) 10/17/2015   BUN 25 (H) 10/17/2015   CO2 35 (H) 10/17/2015   TSH 2.97 10/17/2015   INR 1.1 10/17/2008   HGBA1C 5.7 08/31/2012    Mr Brain Wo Contrast  Result Date: 03/15/2015 [facility] NEURO[facility] REPORT STUDY DATE: 03/15/15 PATIENT NAME: Courtney Day DOB: Jun 19, 1934 MRN: [id number] ORDERING CLINICIAN: Delia Heady, MD CLINICAL HISTORY: 82 year old female with dementia. LZAX study. EXAM: MRI brain (without) TECHNIQUE: MRI of the brain without contrast was obtained following LZAX study protocol. CONTRAST: no IMAGING SITE: [facility] [address] (1.5 Tesla MRI)  FINDINGS: No abnormal lesions are seen on diffusion-weighted views to suggest acute ischemia. The cortical sulci, fissures and cisterns are notable for mild perisylvian and moderate mesial temporal atrophy. Lateral, third and fourth ventricle are normal in size and appearance. No extra-axial fluid collections are seen. No evidence of mass effect or midline shift. Scattered periventricular and subcortical and pontine chronic small vessel ischemic disease. On sagittal views the posterior fossa, pituitary gland and corpus callosum are unremarkable. No evidence of intracranial hemorrhage on gradient-echo views. The orbits and their contents, paranasal sinuses and calvarium are notable for hyperostosis interna.  Intracranial flow voids are present.   Abnormal MRI brain (without) demonstrating: 1. Scattered periventricular and subcortical and pontine chronic small vessel ischemic disease. 2. Mild perisylvian and moderate mesial temporal atrophy. 3. No acute findings. No intracerebral hemorrhage or amyloid related imaging abnormalities  (ARIA). INTERPRETING PHYSICIAN: Suanne Marker, MD Certified in Neurology, Neurophysiology and Neuroimaging [facility] [address], [address] [place], [place] [address] [phone]    Assessment & Plan:   There are no diagnoses linked to this encounter. I have discontinued Ms. Boydstun's gabapentin. I am also having her maintain her Cholecalciferol, b complex vitamins, amLODipine, loratadine, triamcinolone cream, aspirin, vitamin E, hydrochlorothiazide, donepezil, QUEtiapine, diclofenac sodium, and triamterene-hydrochlorothiazide.  No orders of the defined types were placed in this encounter.    Follow-up: No Follow-up on file.  Sonda Primes, MD

## 2016-04-14 NOTE — Assessment & Plan Note (Signed)
HCTZ and amlodipine

## 2016-04-14 NOTE — Assessment & Plan Note (Signed)
BMET 

## 2016-04-14 NOTE — Assessment & Plan Note (Signed)
Triamc cream seems to be helping a little

## 2016-04-14 NOTE — Assessment & Plan Note (Signed)
On Aricept 

## 2016-04-15 ENCOUNTER — Other Ambulatory Visit: Payer: Self-pay | Admitting: Internal Medicine

## 2016-04-15 MED ORDER — CEFUROXIME AXETIL 250 MG PO TABS: 250.0000 mg | ORAL_TABLET | Freq: Two times a day (BID) | ORAL | 0 refills | Status: AC

## 2016-05-08 ENCOUNTER — Other Ambulatory Visit: Payer: Self-pay | Admitting: Internal Medicine

## 2016-06-02 ENCOUNTER — Telehealth: Payer: Self-pay

## 2016-06-02 MED ORDER — DICLOFENAC SODIUM 1 % TD GEL: 2.0000 g | Freq: Two times a day (BID) | TRANSDERMAL | 1 refills | Status: DC

## 2016-06-02 MED ORDER — QUETIAPINE FUMARATE 50 MG PO TABS: 50.0000 mg | ORAL_TABLET | Freq: Every day | ORAL | 5 refills | Status: DC

## 2016-06-02 NOTE — Telephone Encounter (Signed)
[phone] number to [facility] in [employer] on [address].   Called and spoke to Tracy. She is faxing a new FL2 form.

## 2016-06-02 NOTE — Telephone Encounter (Signed)
FL2 brought in by spouse.   Contacted spouse for additional information for spouse and current status.

## 2016-06-04 NOTE — Telephone Encounter (Signed)
Spouse informed that FL 2 is ready up front.   Place in cabinet up front and copy sent to charge.

## 2016-06-10 DIAGNOSIS — Z0289 Encounter for other administrative examinations: Secondary | ICD-10-CM

## 2016-06-25 ENCOUNTER — Ambulatory Visit: Payer: Self-pay | Admitting: Family

## 2016-06-29 ENCOUNTER — Ambulatory Visit: Payer: MEDICARE | Admitting: Family

## 2016-06-29 ENCOUNTER — Telehealth: Payer: Self-pay | Admitting: Internal Medicine

## 2016-06-29 NOTE — Telephone Encounter (Signed)
Patient no showed for 5/7 acute visit.  Please advise.

## 2016-06-30 NOTE — Telephone Encounter (Signed)
Noted. Thx.

## 2016-09-21 ENCOUNTER — Other Ambulatory Visit: Payer: Self-pay

## 2016-10-27 ENCOUNTER — Encounter: Payer: Self-pay | Admitting: Internal Medicine

## 2016-11-09 ENCOUNTER — Other Ambulatory Visit: Payer: Self-pay | Admitting: Internal Medicine

## 2016-11-30 ENCOUNTER — Ambulatory Visit (INDEPENDENT_AMBULATORY_CARE_PROVIDER_SITE_OTHER): Payer: MEDICARE

## 2016-11-30 DIAGNOSIS — Z23 Encounter for immunization: Secondary | ICD-10-CM | POA: Diagnosis not present

## 2016-12-07 ENCOUNTER — Telehealth: Payer: Self-pay | Admitting: Neurology

## 2016-12-07 NOTE — Telephone Encounter (Signed)
Pt last sen 2016, husband needs help with patient. Pt has alzheimers disease.Pt is schedule with Dr. Pearlean Brownie.

## 2016-12-07 NOTE — Telephone Encounter (Signed)
Pt's husband called she is having severe memory issues and he needs in home help. An appt has been scheduled for 12/3 but he is requesting her to be seen sooner please.

## 2016-12-07 NOTE — Telephone Encounter (Signed)
Pts husband will be call back by Dois Davenport in phone room. Pts husband  will be offer an appt next week according to Dr.Sethi schedule.

## 2016-12-15 ENCOUNTER — Encounter: Payer: Self-pay | Admitting: Neurology

## 2016-12-15 ENCOUNTER — Ambulatory Visit (INDEPENDENT_AMBULATORY_CARE_PROVIDER_SITE_OTHER): Payer: MEDICARE | Admitting: Neurology

## 2016-12-15 VITALS — BP 156/71 | HR 54 | Wt 145.0 lb

## 2016-12-15 DIAGNOSIS — F0281 Dementia in other diseases classified elsewhere with behavioral disturbance: Secondary | ICD-10-CM | POA: Diagnosis not present

## 2016-12-15 DIAGNOSIS — F0280 Dementia in other diseases classified elsewhere without behavioral disturbance: Secondary | ICD-10-CM | POA: Diagnosis not present

## 2016-12-15 DIAGNOSIS — G301 Alzheimer's disease with late onset: Secondary | ICD-10-CM | POA: Diagnosis not present

## 2016-12-15 DIAGNOSIS — F02818 Dementia in other diseases classified elsewhere, unspecified severity, with other behavioral disturbance: Secondary | ICD-10-CM

## 2016-12-15 DIAGNOSIS — G308 Other Alzheimer's disease: Secondary | ICD-10-CM | POA: Diagnosis not present

## 2016-12-15 MED ORDER — DONEPEZIL HCL 23 MG PO TABS
23.0000 mg | ORAL_TABLET | Freq: Every day | ORAL | 5 refills | Status: DC
Start: 2016-12-15 — End: 2017-06-14

## 2016-12-15 NOTE — Patient Instructions (Signed)
I had a long discussion the patient and her husband regarding her Alzheimer's dementia which appears to have progressed. Recommend increase Aricept dose to 23 mg daily if tolerated. I discussed possible side effects with the patient and husband and asked him to call me if needed. They will also make a referral to home health for some assistance at home. Consider possible participation in the dementia agitation study if interested. Return for follow-up in 6 months or call earlier if necessary.

## 2016-12-15 NOTE — Progress Notes (Signed)
[facility] [address] [place]. [place] [address] [phone]       OFFICE FOLLOW-UP NOTE  Courtney. Doran HeaterLena H Day Date of Birth:  27-Oct-1934 Medical Record Number:  [id number]   HPI: Courtney Day is a pleasant 82 year old African-American lady who has long-standing history of Alzheimer's dementia for several years. She was initially seen by Dr. Terrace ArabiaYan in our office and subsequently followed up with me and was enrolled in expiration 3 research study which she finished last year. Patient has had progressive cognitive worsening particularly in the last 1 year. Her husband had to quit his job and is staying at home caring for her full-time. She in the past had trouble tolerating Exelon patch as well as Namenda. She is currently on Aricept 10 mg daily but she has at times trouble swallowing the pill and at that time she refuses it. Her husband has to be careful and make sure that she swallows the pill under his supervision because there time she can the filled out. She is fixated on swallowing only pink colored pills and not of any other color. She does have some delusions and occasional mild hallucinations but she can be redirected. She is at occasions of dilatation as he can be redirected. She's had no injuries falls and unsafe behavior. Husband has essentially given up his job in and looks after her full-time. Is asking for some help with home health aides and wants a referral for the same to advanced home care.review of electronic medical record showed last MRI scan of the brain done on 09/07/13 which showed mild degree of generalized cerebral atrophy and chronic microvascular ischemic changes.  ROS:   14 system review of systems is positive for  Activity change, cold intolerance, frequency of urination, memory loss, behavioral problem, confusion, agitation, hallucination, restless legs, frequent waking, daytime sleepiness and all other systems negative  PMH:  Past Medical History:  Diagnosis  Date  . Allergy   . Asthma    > probably all pseudoasthma, -D/C ACE March 30,2010 > pseudowheeze resolved,cough resolved SPN RLL  . Dementia 2010   Dr. Terrace ArabiaYan  . Depression   . Hypertension   . Knee pain    Bilateral  . Low back pain   . Osteoarthritis   . Vitiligo 2010    Social History:  Social History   Social History  . Marital status: Married    Spouse name: N/A  . Number of children: N/A  . Years of education: N/A   Occupational History  . Retired Retired   Social History Main Topics  . Smoking status: Never Smoker  . Smokeless tobacco: Never Used  . Alcohol use No  . Drug use: No  . Sexual activity: Not on file   Other Topics Concern  . Not on file   Social History Narrative   Last heavy exposure 1994    Medications:   Current Outpatient Prescriptions on File Prior to Visit  Medication Sig Dispense Refill  . b complex vitamins tablet Take 1 tablet by mouth daily.      . Cholecalciferol 1000 UNITS tablet Take 1,000 Units by mouth daily.      . diclofenac sodium (VOLTAREN) 1 % GEL Apply 2 g topically 2 (two) times daily. 100 g 1  . hydrochlorothiazide (HYDRODIURIL) 12.5 MG tablet Take 1 tablet (12.5 mg total) by mouth every morning. Annual appt is overdue  must see provider for future refills 30 tablet 0  . loratadine (CLARITIN) 10 MG tablet  Take 1 tablet (10 mg total) by mouth daily. 100 tablet 3  . QUEtiapine (SEROQUEL) 50 MG tablet Take 1 tablet (50 mg total) by mouth at bedtime. 30 tablet 5  . triamterene-hydrochlorothiazide (MAXZIDE-25) 37.5-25 MG tablet TAKE 1 TABLET BY MOUTH DAILY. 90 tablet 1   No current facility-administered medications on file prior to visit.     Allergies:   Allergies  Allergen Reactions  . Ace Inhibitors     REACTION: cough  . Amlodipine Besylate-Valsartan Other (See Comments)    Other Reaction: Other reaction REACTION: dizzy  . Namenda [Memantine Hcl]     ?  . Valsartan     REACTION: dizzy    Physical Exam General:  well developed, well nourished elderly african american lady, seated, in no evident distress Head: head normocephalic and atraumatic.  Neck: supple with no carotid or supraclavicular bruits Cardiovascular: regular rate and rhythm, no murmurs Musculoskeletal: no deformity Skin:  no rash/petichiae Vascular:  Normal pulses all extremities Vitals:   12/15/16 1339  BP: (!) 156/71  Pulse: (!) 54   Neurologic Exam Mental Status: Awake and fully alert. Disoriented to place and time. Recent and remote memory poort. Attention span, concentration and fund of knowledge poor Mood and affect appropriate. MMSE 8/30 with deficits in orientation, registration, attention calculation and falling three-step commands. Unable to name even a single animal with 4 legs. Unable to copy intersecting pentagons or draw a clock. Cranial Nerves: Fundoscopic exam reveals sharp disc margins. Pupils equal, briskly reactive to light. Extraocular movements full without nystagmus. Visual fields full to confrontation. Hearing intact. Facial sensation intact. Face, tongue, palate moves normally and symmetrically.  Motor: Normal bulk and tone. Normal strength in all tested extremity muscles. Sensory.: intact to touch ,pinprick .position and vibratory sensation.  Coordination: Rapid alternating movements normal in all extremities. Finger-to-nose and heel-to-shin performed accurately bilaterally. Gait and Station: Arises from chair without difficulty. Stance is normal. Gait demonstrates normal stride length and balance . Unable to heel, toe and tandem walk   Reflexes: 1+ and symmetric. Toes downgoing.       ASSESSMENT: 82 year old African-American lady with advanced Alzheimer's dementia which seems to have progressed particularly in the last year. Patient participated in the EXPEDITION 3 research study.    PLAN: I had a long discussion the patient and her husband regarding her Alzheimer's dementia which appears to have  progressed. Recommend increase Aricept dose to 23 mg daily if tolerated. I discussed possible side effects with the patient and husband and asked him to call me if needed. They will also make a referral to home health for some assistance at home. Consider possible participation in the dementia agitation study if interested. Return for follow-up in 6 months or call earlier if necessary. Greater than 50% of time during this 25 minute visit was spent on counseling,explanation of diagnosis of advanced alzheimer`s dementia, planning of further management, discussion with patient and family and coordination of care Delia Heady, MD  [facility] [address] [place], [place] [address]  Phone [phone] Fax [phone] Note: This document was prepared with digital dictation and possible smart phrase technology. Any transcriptional errors that result from this process are unintentional

## 2016-12-21 ENCOUNTER — Telehealth: Payer: Self-pay | Admitting: Neurology

## 2016-12-21 NOTE — Telephone Encounter (Signed)
Courtney Day/[facility] [phone] called said the husband declined the service, he is wanting homehealth for 35 hrs/week and they do not provide those services.

## 2016-12-22 NOTE — Telephone Encounter (Signed)
Called and left Marchelle Folksmanda  A message to call me back.

## 2016-12-24 NOTE — Telephone Encounter (Signed)
Called and spoke to patient's husband and he has going with [facility].

## 2017-01-25 ENCOUNTER — Ambulatory Visit: Payer: MEDICARE | Admitting: Neurology

## 2017-06-14 ENCOUNTER — Encounter: Payer: Self-pay | Admitting: Neurology

## 2017-06-14 ENCOUNTER — Ambulatory Visit (INDEPENDENT_AMBULATORY_CARE_PROVIDER_SITE_OTHER): Payer: MEDICARE | Admitting: Neurology

## 2017-06-14 VITALS — BP 155/84 | HR 62 | Wt 144.8 lb

## 2017-06-14 DIAGNOSIS — F0280 Dementia in other diseases classified elsewhere without behavioral disturbance: Secondary | ICD-10-CM

## 2017-06-14 DIAGNOSIS — G301 Alzheimer's disease with late onset: Secondary | ICD-10-CM

## 2017-06-14 MED ORDER — DONEPEZIL HCL 23 MG PO TABS: 23.0000 mg | ORAL_TABLET | Freq: Every day | ORAL | 3 refills | Status: DC

## 2017-06-14 NOTE — Patient Instructions (Signed)
I had a long discussion the patient and her husband regarding her advanced Alzheimer's dementia unfortunately there are no further medication options available. I recommend she continue Aricept 23 mg daily and I gave her refill for a year. Patient needs 24-hour Constant supervision. She will return for follow-up in a year with   Shanda Bumpsjessica, NPor call earlier if necessary

## 2017-06-14 NOTE — Progress Notes (Signed)
[facility] [address] [place]. [place] [address] [phone]       OFFICE FOLLOW-UP NOTE  Courtney. Courtney Day Date of Birth:  1934-07-22 Medical Record Number:  [id number]   HPI: Office visit 12/15/16:Courtney Day is a pleasant 83 year old African-American lady who has long-standing history of Alzheimer's dementia for several years. Courtney Day was initially seen by Dr. Terrace Arabia in our office and subsequently followed up with me and was enrolled in expiration 3 research study which Courtney Day finished last year. Patient has had progressive cognitive worsening particularly in the last 1 year. Her husband had to quit his job and is staying at home caring for her full-time. Courtney Day in the past had trouble tolerating Exelon patch as well as Namenda. Courtney Day is currently on Aricept 10 mg daily but Courtney Day has at times trouble swallowing the pill and at that time Courtney Day refuses it. Her husband has to be careful and make sure that Courtney Day swallows the pill under his supervision because there time Courtney Day can the filled out. Courtney Day is fixated on swallowing only pink colored pills and not of any other color. Courtney Day does have some delusions and occasional mild hallucinations but Courtney Day can be redirected. Courtney Day is at occasions of dilatation as he can be redirected. Courtney Day's had no injuries falls and unsafe behavior. Husband has essentially given up his job in and looks after her full-time. Is asking for some help with home health aides and wants a referral for the same to advanced home care.review of electronic medical record showed last MRI scan of the brain done on 09/07/13 which showed mild degree of generalized cerebral atrophy and chronic microvascular ischemic changes. Update 06/14/2017 : Courtney Day returns for follow-up after last visit 6 months ago. Courtney Day is accompanied by her husband who provides all history. Patient continues to have severe advanced Alzheimer's and requires 24-hour care. Courtney Day still has difficulty swallowing her pills. Courtney Day is tolerating  Aricept 23 mg daily without significant side effects. On Mini-Mental testing today Courtney Day scored 6/30 which is similar to 8/30 at last visit. Courtney Day is no longer taking Seroquel at night but is able to sleep well. There have been no significant episodes of behavioral agitation or confusion. Courtney Day does have some hallucinations and delusions but these are not bothersome. Courtney Day is pretty safe with her walking and has not had any falls or injuries. The husband has quit his job and is a full-time caregiver for the patient.He is requesting a one year refill for Aricept. ROS:   14 system review of systems is positive for  Frequent waking, daytime sleepiness, difficulty urinating, memory loss, behavioral problem, confusion, hallucinations, rashand all other systems negative  PMH:  Past Medical History:  Diagnosis Date  . Allergy   . Asthma    > probably all pseudoasthma, -D/C ACE March 30,2010 > pseudowheeze resolved,cough resolved SPN RLL  . Dementia 2010   Dr. Terrace Arabia  . Depression   . Hypertension   . Knee pain    Bilateral  . Low back pain   . Osteoarthritis   . Vitiligo 2010    Social History:  Social History   Socioeconomic History  . Marital status: Married    Spouse name: Not on file  . Number of children: Not on file  . Years of education: Not on file  . Highest education level: Not on file  Occupational History  . Occupation: Retired    Associate Professor: RETIRED  Social Needs  . Financial resource strain: Not on file  .  Food insecurity:    Worry: Not on file    Inability: Not on file  . Transportation needs:    Medical: Not on file    Non-medical: Not on file  Tobacco Use  . Smoking status: Never Smoker  . Smokeless tobacco: Never Used  Substance and Sexual Activity  . Alcohol use: No  . Drug use: No  . Sexual activity: Not on file  Lifestyle  . Physical activity:    Days per week: Not on file    Minutes per session: Not on file  . Stress: Not on file  Relationships  . Social  connections:    Talks on phone: Not on file    Gets together: Not on file    Attends religious service: Not on file    Active member of club or organization: Not on file    Attends meetings of clubs or organizations: Not on file    Relationship status: Not on file  . Intimate partner violence:    Fear of current or ex partner: Not on file    Emotionally abused: Not on file    Physically abused: Not on file    Forced sexual activity: Not on file  Other Topics Concern  . Not on file  Social History Narrative   Last heavy exposure 1994    Medications:   Current Outpatient Medications on File Prior to Visit  Medication Sig Dispense Refill  . diclofenac sodium (VOLTAREN) 1 % GEL Apply 2 g topically 2 (two) times daily. 100 g 1  . hydrochlorothiazide (HYDRODIURIL) 12.5 MG tablet Take 1 tablet (12.5 mg total) by mouth every morning. Annual appt is overdue  must see provider for future refills 30 tablet 0  . loratadine (CLARITIN) 10 MG tablet Take 1 tablet (10 mg total) by mouth daily. 100 tablet 3  . triamterene-hydrochlorothiazide (MAXZIDE-25) 37.5-25 MG tablet TAKE 1 TABLET BY MOUTH DAILY. 90 tablet 1  . vitamin E (E1000) 1000 UNIT capsule Take by mouth.    . QUEtiapine (SEROQUEL) 50 MG tablet Take 1 tablet (50 mg total) by mouth at bedtime. (Patient not taking: Reported on 06/14/2017) 30 tablet 5   No current facility-administered medications on file prior to visit.     Allergies:   Allergies  Allergen Reactions  . Ace Inhibitors     REACTION: cough  . Amlodipine Besylate-Valsartan Other (See Comments)    Other Reaction: Other reaction REACTION: dizzy  . Namenda [Memantine Hcl]     ?  . Valsartan     REACTION: dizzy    Physical Exam General: frail elderly african american lady, seated, in no evident distress Head: head normocephalic and atraumatic.  Neck: supple with no carotid or supraclavicular bruits Cardiovascular: regular rate and rhythm, no murmurs Musculoskeletal:  no deformity Skin:  no rash/petichiae Vascular:  Normal pulses all extremities Vitals:   06/14/17 1253  BP: (!) 155/84  Pulse: 62   Neurologic Exam Mental Status: Awake and fully alert. Disoriented to place and time. Recent and remote memory poort. Attention span, concentration and fund of knowledge poor Mood and affect appropriate. MMSE  6/30 today( last visit 8/30 )with deficits in orientation, registration, attention calculation and falling three-step commands. Unable to name even a single animal with 4 legs. Unable to copy intersecting pentagons or draw a clock. Cranial Nerves: Fundoscopic exam reveals sharp disc margins. Pupils equal, briskly reactive to light. Extraocular movements full without nystagmus. Visual fields full to confrontation. Hearing intact. Facial sensation intact. Face, tongue,  palate moves normally and symmetrically.  Motor: Normal bulk and tone. Normal strength in all tested extremity muscles. Sensory.: intact to touch ,pinprick .position and vibratory sensation.  Coordination: Rapid alternating movements normal in all extremities. Finger-to-nose and heel-to-shin performed accurately bilaterally. Gait and Station: Arises from chair without difficulty. Stance is normal. Gait demonstrates normal stride length and balance . Unable to heel, toe and tandem walk   Reflexes: 1+ and symmetric. Toes downgoing.       ASSESSMENT: 83 year old African-American lady with advanced Alzheimer's dementia which seems to have progressed particularly in the last year. Patient participated in the EXPEDITION 3 research study.    PLAN: I had a long discussion the patient and her husband regarding her advanced Alzheimer's dementia unfortunately there are no further medication options available. I recommend Courtney Day continue Aricept 23 mg daily and I gave her refill for a year. Patient needs 24-hour Constant supervision. Courtney Day will return for follow-up in a year with   Shanda Bumps, NPor call earlier  if necessary.Greater than 50% of time during this 25 minute visit was spent on counseling,explanation of diagnosis of advanced alzheimer`s dementia, planning of further management, discussion with patient and family and coordination of care Delia Heady, MD  [facility] [address] [place], [place] [address]  Phone [phone] Fax [phone] Note: This document was prepared with digital dictation and possible smart phrase technology. Any transcriptional errors that result from this process are unintentional

## 2017-07-08 NOTE — Progress Notes (Signed)
Rn receive fax to [facility] for lower price of aricept . Dr. Pearlean Brownie sign form and it was fax back to [phone] pts pharmacy listed.

## 2017-08-13 ENCOUNTER — Other Ambulatory Visit: Payer: Self-pay | Admitting: Internal Medicine

## 2017-09-03 ENCOUNTER — Encounter: Payer: Self-pay | Admitting: Internal Medicine

## 2017-09-03 ENCOUNTER — Other Ambulatory Visit (INDEPENDENT_AMBULATORY_CARE_PROVIDER_SITE_OTHER): Payer: MEDICARE

## 2017-09-03 ENCOUNTER — Ambulatory Visit (INDEPENDENT_AMBULATORY_CARE_PROVIDER_SITE_OTHER): Payer: MEDICARE | Admitting: Internal Medicine

## 2017-09-03 VITALS — BP 130/72 | HR 64 | Temp 98.2°F | Ht 62.0 in | Wt 145.0 lb

## 2017-09-03 DIAGNOSIS — I10 Essential (primary) hypertension: Secondary | ICD-10-CM

## 2017-09-03 DIAGNOSIS — F4321 Adjustment disorder with depressed mood: Secondary | ICD-10-CM

## 2017-09-03 DIAGNOSIS — Z0000 Encounter for general adult medical examination without abnormal findings: Secondary | ICD-10-CM | POA: Diagnosis not present

## 2017-09-03 DIAGNOSIS — F0280 Dementia in other diseases classified elsewhere without behavioral disturbance: Secondary | ICD-10-CM

## 2017-09-03 DIAGNOSIS — G301 Alzheimer's disease with late onset: Secondary | ICD-10-CM | POA: Diagnosis not present

## 2017-09-03 LAB — CBC WITH DIFFERENTIAL/PLATELET
Basophils Absolute: 0.1 10*3/uL (ref 0.0–0.1)
Basophils Relative: 0.7 % (ref 0.0–3.0)
Eosinophils Absolute: 0.2 10*3/uL (ref 0.0–0.7)
Eosinophils Relative: 1.8 % (ref 0.0–5.0)
HEMATOCRIT: 39.5 % (ref 36.0–46.0)
HEMOGLOBIN: 12.9 g/dL (ref 12.0–15.0)
LYMPHS PCT: 23.2 % (ref 12.0–46.0)
Lymphs Abs: 2 10*3/uL (ref 0.7–4.0)
MCHC: 32.8 g/dL (ref 30.0–36.0)
MCV: 83.6 fl (ref 78.0–100.0)
MONOS PCT: 8.1 % (ref 3.0–12.0)
Monocytes Absolute: 0.7 10*3/uL (ref 0.1–1.0)
NEUTROS ABS: 5.8 10*3/uL (ref 1.4–7.7)
Neutrophils Relative %: 66.2 % (ref 43.0–77.0)
PLATELETS: 171 10*3/uL (ref 150.0–400.0)
RBC: 4.72 Mil/uL (ref 3.87–5.11)
RDW: 16.2 % — ABNORMAL HIGH (ref 11.5–15.5)
WBC: 8.8 10*3/uL (ref 4.0–10.5)

## 2017-09-03 LAB — BASIC METABOLIC PANEL
BUN: 31 mg/dL — ABNORMAL HIGH (ref 6–23)
CO2: 35 mEq/L — ABNORMAL HIGH (ref 19–32)
Calcium: 9.3 mg/dL (ref 8.4–10.5)
Chloride: 101 mEq/L (ref 96–112)
Creatinine, Ser: 1.27 mg/dL — ABNORMAL HIGH (ref 0.40–1.20)
GFR: 52 mL/min — AB (ref >60.00)
GLUCOSE: 123 mg/dL — AB (ref 70–99)
POTASSIUM: 3.6 meq/L (ref 3.5–5.1)
SODIUM: 143 meq/L (ref 135–145)

## 2017-09-03 LAB — HEPATIC FUNCTION PANEL
ALT: 13 U/L (ref 0–35)
AST: 20 U/L (ref 0–37)
Albumin: 4 g/dL (ref 3.5–5.2)
Alkaline Phosphatase: 129 U/L — ABNORMAL HIGH (ref 39–117)
BILIRUBIN TOTAL: 0.2 mg/dL (ref 0.2–1.2)
Bilirubin, Direct: 0 mg/dL (ref 0.0–0.3)
Total Protein: 7.6 g/dL (ref 6.0–8.3)

## 2017-09-03 LAB — TSH: TSH: 4.05 u[IU]/mL (ref 0.35–4.50)

## 2017-09-03 MED ORDER — VITAMIN D3 1.25 MG (50000 UT) PO CAPS: 1.0000 | ORAL_CAPSULE | ORAL | 3 refills | Status: DC

## 2017-09-03 MED ORDER — QUETIAPINE FUMARATE 50 MG PO TABS: 50.0000 mg | ORAL_TABLET | Freq: Every day | ORAL | 5 refills | Status: DC

## 2017-09-03 MED ORDER — HYDROCHLOROTHIAZIDE 12.5 MG PO TABS: 12.5000 mg | ORAL_TABLET | Freq: Every morning | ORAL | 3 refills | Status: DC

## 2017-09-03 NOTE — Assessment & Plan Note (Signed)
Severe Seroquel

## 2017-09-03 NOTE — Assessment & Plan Note (Signed)
Labs

## 2017-09-03 NOTE — Assessment & Plan Note (Signed)
seroquel low dose 

## 2017-09-03 NOTE — Progress Notes (Signed)
Subjective:  Patient ID: Courtney Day, female    DOB: 03-31-1934  Age: 83 y.o. MRN: [id number]  CC: No chief complaint on file.   HPI Courtney Day presents for a [facility] well exam F/u dementia - worse  Outpatient Medications Prior to Visit  Medication Sig Dispense Refill  . diclofenac sodium (VOLTAREN) 1 % GEL Apply 2 g topically 2 (two) times daily. 100 g 1  . donepezil (ARICEPT) 23 MG TABS tablet Take 1 tablet (23 mg total) by mouth at bedtime. 90 tablet 3  . hydrochlorothiazide (HYDRODIURIL) 12.5 MG tablet Take 1 tablet (12.5 mg total) by mouth every morning. Annual appt is overdue  must see provider for future refills 30 tablet 0  . loratadine (CLARITIN) 10 MG tablet Take 1 tablet (10 mg total) by mouth daily. 100 tablet 3  . QUEtiapine (SEROQUEL) 50 MG tablet Take 1 tablet (50 mg total) by mouth at bedtime. 30 tablet 5  . triamterene-hydrochlorothiazide (MAXZIDE-25) 37.5-25 MG tablet TAKE 1 TABLET BY MOUTH DAILY. 90 tablet 0  . vitamin E (E1000) 1000 UNIT capsule Take by mouth.     No facility-administered medications prior to visit.     ROS: Review of Systems  Constitutional: Positive for fatigue. Negative for activity change, appetite change, chills and unexpected weight change.  HENT: Negative for congestion, mouth sores and sinus pressure.   Eyes: Negative for visual disturbance.  Respiratory: Negative for cough and chest tightness.   Gastrointestinal: Negative for abdominal pain and nausea.  Genitourinary: Negative for difficulty urinating, frequency and vaginal pain.  Musculoskeletal: Negative for back pain and gait problem.  Skin: Negative for pallor and rash.  Neurological: Negative for dizziness, tremors, weakness, numbness and headaches.  Psychiatric/Behavioral: Positive for behavioral problems, confusion, decreased concentration, dysphoric mood and sleep disturbance. Negative for suicidal ideas. The patient is nervous/anxious.     Objective:  BP 130/72 (BP Location:  Left Arm, Patient Position: Sitting, Cuff Size: Normal)   Pulse 64   Temp 98.2 F (36.8 C) (Oral)   Ht 5\' 2"  (1.575 m)   Wt 145 lb (65.8 kg)   SpO2 98%   BMI 26.52 kg/m   BP Readings from Last 3 Encounters:  09/03/17 130/72  06/14/17 (!) 155/84  12/15/16 (!) 156/71    Wt Readings from Last 3 Encounters:  09/03/17 145 lb (65.8 kg)  06/14/17 144 lb 12.8 oz (65.7 kg)  12/15/16 145 lb (65.8 kg)    Physical Exam  Constitutional: She appears well-developed. No distress.  HENT:  Head: Normocephalic.  Right Ear: External ear normal.  Left Ear: External ear normal.  Nose: Nose normal.  Mouth/Throat: Oropharynx is clear and moist.  Eyes: Pupils are equal, round, and reactive to light. Conjunctivae are normal. Right eye exhibits no discharge. Left eye exhibits no discharge.  Neck: Normal range of motion. Neck supple. No JVD present. No tracheal deviation present. No thyromegaly present.  Cardiovascular: Normal rate, regular rhythm and normal heart sounds.  Pulmonary/Chest: No stridor. No respiratory distress. She has no wheezes.  Abdominal: Soft. Bowel sounds are normal. She exhibits no distension and no mass. There is no tenderness. There is no rebound and no guarding.  Musculoskeletal: She exhibits no edema or tenderness.  Lymphadenopathy:    She has no cervical adenopathy.  Neurological: She displays normal reflexes. No cranial nerve deficit. She exhibits normal muscle tone. Coordination normal.  Skin: No rash noted. No erythema.  Psychiatric: Her behavior is normal.  vetiligo on hands Flat affect  Lab Results  Component Value Date   WBC 8.9 10/17/2015   HGB 11.9 (L) 10/17/2015   HCT 36.1 10/17/2015   PLT 202.0 10/17/2015   GLUCOSE 85 04/14/2016   CHOL 166 05/18/2013   TRIG 111.0 05/18/2013   HDL 51.90 05/18/2013   LDLCALC 92 05/18/2013   ALT 9 10/17/2015   AST 18 10/17/2015   NA 141 04/14/2016   K 3.6 04/14/2016   CL 104 04/14/2016   CREATININE 1.23 (H) 04/14/2016    BUN 22 04/14/2016   CO2 31 04/14/2016   TSH 2.97 10/17/2015   INR 1.1 10/17/2008   HGBA1C 5.7 08/31/2012    Mr Brain Wo Contrast  Result Date: 03/15/2015 [facility] NEUROIMAGING REPORT STUDY DATE: 03/15/15 PATIENT NAME: Courtney Day DOB: 11-27-1934 MRN: [id number] ORDERING CLINICIAN: Delia HeadyPramod Sethi, MD CLINICAL HISTORY: 83 year old female with dementia. LZAX study. EXAM: MRI brain (without) TECHNIQUE: MRI of the brain without contrast was obtained following LZAX study protocol. CONTRAST: no IMAGING SITE: [facility] [address] (1.5 Tesla MRI)  FINDINGS: No abnormal lesions are seen on diffusion-weighted views to suggest acute ischemia. The cortical sulci, fissures and cisterns are notable for mild perisylvian and moderate mesial temporal atrophy. Lateral, third and fourth ventricle are normal in size and appearance. No extra-axial fluid collections are seen. No evidence of mass effect or midline shift. Scattered periventricular and subcortical and pontine chronic small vessel ischemic disease. On sagittal views the posterior fossa, pituitary gland and corpus callosum are unremarkable. No evidence of intracranial hemorrhage on gradient-echo views. The orbits and their contents, paranasal sinuses and calvarium are notable for hyperostosis interna.  Intracranial flow voids are present.   Abnormal MRI brain (without) demonstrating: 1. Scattered periventricular and subcortical and pontine chronic small vessel ischemic disease. 2. Mild perisylvian and moderate mesial temporal atrophy. 3. No acute findings. No intracerebral hemorrhage or amyloid related imaging abnormalities (ARIA). INTERPRETING PHYSICIAN: Suanne MarkerVIKRAM R. PENUMALLI, MD Certified in Neurology, Neurophysiology and Neuroimaging [facility] [address], [address] [place], [place] [address] [phone]    Assessment & Plan:   There are no diagnoses linked to this encounter.   No orders of the  defined types were placed in this encounter.    Follow-up: No follow-ups on file.  Sonda PrimesAlex Lamiyah Schlotter, MD

## 2017-09-03 NOTE — Assessment & Plan Note (Signed)
Here for medicare wellness/physical  Diet: heart healthy  Physical activity: not sedentary  Depression/mood screen: dementia Hearing: intact to whispered voice  Visual acuity: grossly normal w/glasses, performs annual eye exam  ADLs: capable w/a lot of help Fall risk: low   Home safety: good  Cognitive evaluation: disoriented EOL planning: adv directives, full code/ I agree  I have personally reviewed and have noted  1. The patient's medical, surgical and social history  2. Their use of alcohol, tobacco or illicit drugs  3. Their current medications and supplements  4. The patient's functional ability including ADL's, fall risks, home safety risks and hearing or visual impairment.  5. Diet and physical activities  6. Evidence for depression or mood disorders - doing ok 7. The roster of all physicians providing medical care to patient - is listed in the Snapshot section of the chart and reviewed today.    Today patient counseled on age appropriate routine health concerns for screening and prevention, each reviewed and up to date or declined. Immunizations reviewed and up to date or declined. Labs ordered and reviewed. Risk factors for depression reviewed and negative. Hearing function and visual acuity are intact. ADLs screened and addressed as needed. Functional ability and level of safety reviewed and appropriate. Education, counseling and referrals performed based on assessed risks today. Patient provided with a copy of personalized plan for preventive services.

## 2017-09-07 ENCOUNTER — Other Ambulatory Visit: Payer: Self-pay

## 2017-09-07 DIAGNOSIS — R748 Abnormal levels of other serum enzymes: Secondary | ICD-10-CM

## 2017-09-07 DIAGNOSIS — R944 Abnormal results of kidney function studies: Secondary | ICD-10-CM

## 2017-09-09 ENCOUNTER — Telehealth: Payer: Self-pay | Admitting: Internal Medicine

## 2017-09-09 NOTE — Telephone Encounter (Signed)
Copied from CRM [id number]. Topic: Quick Communication - Rx Refill/Question >> Sep 09, 2017  3:58 PM Courtney Day, Courtney Day, NT wrote: Medication:  QUEtiapine (SEROQUEL) 50 MG tablet  the phamrac did not receive the request to fill   Has the patient contacted their pharmacy? yes  (Agent: If no, request that the patient contact the pharmacy for the refill (Agent: If yes, when and what did the pharmacy advise?  Preferred Pharmacy (with phone number or street name): [id number] [place], [place] - [address] [phone] (Phone) [phone] (Fax)      Agent: Please be advised that RX refills may take up to 3 business days. We ask that you follow-up with your pharmacy.

## 2017-09-10 NOTE — Telephone Encounter (Signed)
Pt requesting refill of seroquel Pharmacy states PA needed.

## 2017-09-13 NOTE — Telephone Encounter (Signed)
Key: [id number]

## 2017-09-14 NOTE — Telephone Encounter (Signed)
PA approved.

## 2017-11-02 ENCOUNTER — Telehealth: Payer: Self-pay | Admitting: Internal Medicine

## 2017-11-02 NOTE — Telephone Encounter (Signed)
Copied from CRM [id number]. Topic: Quick Communication - See Telephone Encounter >> Nov 02, 2017  2:02 PM Raquel Sarna wrote: Pt's husband is thinking pt has a UTI

## 2017-11-02 NOTE — Telephone Encounter (Signed)
I LVM to inform husband patient is going to need an appointment to have this checked.   LOV: 09/03/2017

## 2017-11-09 ENCOUNTER — Ambulatory Visit (INDEPENDENT_AMBULATORY_CARE_PROVIDER_SITE_OTHER): Payer: MEDICARE | Admitting: Family

## 2017-11-09 ENCOUNTER — Encounter: Payer: Self-pay | Admitting: Family

## 2017-11-09 VITALS — BP 132/70 | HR 60 | Temp 98.6°F | Ht 62.0 in | Wt 155.0 lb

## 2017-11-09 DIAGNOSIS — R350 Frequency of micturition: Secondary | ICD-10-CM

## 2017-11-09 MED ORDER — VITAMIN D3 1.25 MG (50000 UT) PO CAPS: 1.0000 | ORAL_CAPSULE | ORAL | 0 refills | Status: DC

## 2017-11-09 NOTE — Patient Instructions (Signed)
Please bring back the urine sample please.

## 2017-11-09 NOTE — Progress Notes (Signed)
Courtney Day is a 83 y.o. female with the following history as recorded in EpicCare:  Patient Active Problem List   Diagnosis Date Noted  . Well adult exam 05/18/2013  . Right-sided chest wall pain 09/01/2010  . BREAST PAIN 03/18/2010  . Alzheimer's disease 11/26/2009  . Vitiligo 11/26/2009  . PULMONARY NODULE 05/22/2008  . HYPOKALEMIA 05/14/2008  . VERTIGO 05/14/2008  . ALLERGIC RHINITIS 04/27/2008  . Asthma 04/27/2008  . UPPER RESPIRATORY INFECTION (URI) 03/26/2008  . Nonspecific (abnormal) findings on radiological and other examination of body structure 03/26/2008  . CHEST XRAY, ABNORMAL 03/26/2008  . SINUSITIS, ACUTE 02/28/2008  . Osteoarthritis 07/06/2007  . SHOULDER PAIN 07/06/2007  . LEG PAIN 05/03/2007  . Situational depression 12/24/2006  . Essential hypertension 12/24/2006  . LOW BACK PAIN 12/24/2006    Current Outpatient Medications  Medication Sig Dispense Refill  . Cholecalciferol (VITAMIN D3) 50000 units CAPS Take 1 capsule by mouth every 30 (thirty) days. 3 capsule 0  . diclofenac sodium (VOLTAREN) 1 % GEL Apply 2 g topically 2 (two) times daily. 100 g 1  . donepezil (ARICEPT) 23 MG TABS tablet Take 1 tablet (23 mg total) by mouth at bedtime. 90 tablet 3  . hydrochlorothiazide (HYDRODIURIL) 12.5 MG tablet Take 1 tablet (12.5 mg total) by mouth every morning. Annual appt is overdue  must see provider for future refills 90 tablet 3  . QUEtiapine (SEROQUEL) 50 MG tablet Take 1 tablet (50 mg total) by mouth at bedtime. 30 tablet 5  . triamterene-hydrochlorothiazide (MAXZIDE-25) 37.5-25 MG tablet TAKE 1 TABLET BY MOUTH DAILY. 90 tablet 0   No current facility-administered medications for this visit.     Allergies: Ace inhibitors; Amlodipine besylate-valsartan; Namenda [memantine hcl]; and Valsartan  Past Medical History:  Diagnosis Date  . Allergy   . Asthma    > probably all pseudoasthma, -D/C ACE March 30,2010 > pseudowheeze resolved,cough resolved SPN RLL  .  Dementia 2010   Dr. Terrace Arabia  . Depression   . Hypertension   . Knee pain    Bilateral  . Low back pain   . Osteoarthritis   . Vitiligo 2010    Past Surgical History:  Procedure Laterality Date  . ABDOMINAL HYSTERECTOMY    . hystoplasmosis lung biopsy- 2010 Dr. Wenda Overland      Family History  Problem Relation Age of Onset  . Cancer Mother   . Cancer Sister        smoker  . Multiple sclerosis Son   . Hypertension Other     Social History   Tobacco Use  . Smoking status: Never Smoker  . Smokeless tobacco: Never Used  Substance Use Topics  . Alcohol use: No    Subjective:  Patient is brought to the office by her husband; Concerns for possible UTI; feels like her urinary habits "are different." Patient has Alzheimer's disease- unable to offer any type of information/ husband is difficult historian; ? Fever; no blood in urine; not prone to UTIs- per husband, may get an infection, once a year;   Would like to get her flu shot updated today;  Objective:  Vitals:   11/09/17 1103  BP: 132/70  Pulse: 60  Temp: 98.6 F (37 C)  TempSrc: Oral  SpO2: 98%  Weight: 155 lb (70.3 kg)  Height: 5\' 2"  (1.575 m)    General: Well developed, well nourished, in no acute distress  Skin : Warm and dry.  Head: Normocephalic and atraumatic  Lungs: Respirations unlabored; clear to  auscultation bilaterally without wheeze, rales, rhonchi  CVS exam: normal rate and regular rhythm.  Neurologic:  Apparent dementia; Speech intact; face symmetrical; moves all extremities well;   Assessment:  1. Urinary frequency     Plan:  Unable to get urine sample here today- husband will collect sample at home and bring back; follow-up to be determined.  Flu shot updated today;    No follow-ups on file.  No orders of the defined types were placed in this encounter.   Requested Prescriptions   Signed Prescriptions Disp Refills  . Cholecalciferol (VITAMIN D3) 50000 units CAPS 3 capsule 0    Sig: Take 1  capsule by mouth every 30 (thirty) days.

## 2017-11-10 ENCOUNTER — Other Ambulatory Visit: Payer: Self-pay | Admitting: Internal Medicine

## 2017-11-11 ENCOUNTER — Other Ambulatory Visit: Payer: MEDICARE

## 2017-11-11 DIAGNOSIS — R350 Frequency of micturition: Secondary | ICD-10-CM | POA: Diagnosis not present

## 2017-11-12 LAB — URINE CULTURE
MICRO NUMBER:: 91126097
SPECIMEN QUALITY:: ADEQUATE

## 2018-01-12 ENCOUNTER — Other Ambulatory Visit: Payer: Self-pay

## 2018-03-07 ENCOUNTER — Encounter: Payer: Self-pay | Admitting: Internal Medicine

## 2018-03-07 ENCOUNTER — Other Ambulatory Visit (INDEPENDENT_AMBULATORY_CARE_PROVIDER_SITE_OTHER): Payer: MEDICARE

## 2018-03-07 ENCOUNTER — Ambulatory Visit (INDEPENDENT_AMBULATORY_CARE_PROVIDER_SITE_OTHER): Payer: MEDICARE | Admitting: Internal Medicine

## 2018-03-07 VITALS — BP 126/80 | HR 64 | Temp 97.8°F | Ht 62.0 in | Wt 152.0 lb

## 2018-03-07 DIAGNOSIS — G8929 Other chronic pain: Secondary | ICD-10-CM | POA: Diagnosis not present

## 2018-03-07 DIAGNOSIS — I10 Essential (primary) hypertension: Secondary | ICD-10-CM

## 2018-03-07 DIAGNOSIS — M5440 Lumbago with sciatica, unspecified side: Secondary | ICD-10-CM

## 2018-03-07 LAB — BASIC METABOLIC PANEL
BUN: 26 mg/dL — ABNORMAL HIGH (ref 6–23)
CO2: 31 mEq/L (ref 19–32)
Calcium: 9.6 mg/dL (ref 8.4–10.5)
Chloride: 101 mEq/L (ref 96–112)
Creatinine, Ser: 1.12 mg/dL (ref 0.40–1.20)
GFR: 60.04 mL/min (ref >60.00)
Glucose, Bld: 91 mg/dL (ref 70–99)
POTASSIUM: 4.4 meq/L (ref 3.5–5.1)
Sodium: 139 mEq/L (ref 135–145)

## 2018-03-07 MED ORDER — VITAMIN D3 1.25 MG (50000 UT) PO CAPS: 1.0000 | ORAL_CAPSULE | ORAL | 4 refills | Status: DC

## 2018-03-07 MED ORDER — TRIAMCINOLONE ACETONIDE 0.5 % EX CREA: 1.0000 "application " | TOPICAL_CREAM | Freq: Three times a day (TID) | CUTANEOUS | 1 refills | Status: DC

## 2018-03-07 NOTE — Progress Notes (Signed)
Subjective:  Patient ID: Courtney Day, female    DOB: 14-Jun-1934  Age: 84 y.o. MRN: [id number]  CC: No chief complaint on file.   HPI TUONGVI VARNEY presents for dementia, Vit D def, vetiligo  Outpatient Medications Prior to Visit  Medication Sig Dispense Refill  . Cholecalciferol (VITAMIN D3) 50000 units CAPS Take 1 capsule by mouth every 30 (thirty) days. 3 capsule 0  . donepezil (ARICEPT) 23 MG TABS tablet Take 1 tablet (23 mg total) by mouth at bedtime. 90 tablet 3  . hydrochlorothiazide (HYDRODIURIL) 12.5 MG tablet Take 1 tablet (12.5 mg total) by mouth every morning. Annual appt is overdue  must see provider for future refills 90 tablet 3  . QUEtiapine (SEROQUEL) 50 MG tablet Take 1 tablet (50 mg total) by mouth at bedtime. 30 tablet 5  . triamterene-hydrochlorothiazide (MAXZIDE-25) 37.5-25 MG tablet TAKE 1 TABLET BY MOUTH EVERY DAY 90 tablet 2  . triamcinolone cream (KENALOG) 0.5 % Apply 1 application topically 3 (three) times daily.    . diclofenac sodium (VOLTAREN) 1 % GEL Apply 2 g topically 2 (two) times daily. (Patient not taking: Reported on 03/07/2018) 100 g 1   No facility-administered medications prior to visit.     ROS: Review of Systems  Constitutional: Negative for activity change, appetite change, chills, fatigue and unexpected weight change.  HENT: Negative for congestion, mouth sores and sinus pressure.   Eyes: Negative for visual disturbance.  Respiratory: Negative for cough and chest tightness.   Gastrointestinal: Negative for abdominal pain and nausea.  Genitourinary: Negative for difficulty urinating, frequency and vaginal pain.  Musculoskeletal: Negative for back pain and gait problem.  Skin: Negative for pallor and rash.  Neurological: Negative for dizziness, tremors, weakness, numbness and headaches.  Psychiatric/Behavioral: Positive for confusion and decreased concentration. Negative for sleep disturbance.    Objective:  BP 126/80 (BP Location: Left  Arm, Patient Position: Sitting, Cuff Size: Normal)   Pulse 64   Temp 97.8 F (36.6 C) (Oral)   Ht 5\' 2"  (1.575 m)   Wt 152 lb (68.9 kg)   SpO2 98%   BMI 27.80 kg/m   BP Readings from Last 3 Encounters:  03/07/18 126/80  11/09/17 132/70  09/03/17 130/72    Wt Readings from Last 3 Encounters:  03/07/18 152 lb (68.9 kg)  11/09/17 155 lb (70.3 kg)  09/03/17 145 lb (65.8 kg)    Physical Exam Constitutional:      General: She is not in acute distress.    Appearance: She is well-developed.  HENT:     Head: Normocephalic.     Right Ear: External ear normal.     Left Ear: External ear normal.     Nose: Nose normal.  Eyes:     General:        Right eye: No discharge.        Left eye: No discharge.     Conjunctiva/sclera: Conjunctivae normal.     Pupils: Pupils are equal, round, and reactive to light.  Neck:     Musculoskeletal: Normal range of motion and neck supple.     Thyroid: No thyromegaly.     Vascular: No JVD.     Trachea: No tracheal deviation.  Cardiovascular:     Rate and Rhythm: Normal rate and regular rhythm.     Heart sounds: Normal heart sounds.  Pulmonary:     Effort: No respiratory distress.     Breath sounds: No stridor. No wheezing.  Abdominal:  General: Bowel sounds are normal. There is no distension.     Palpations: Abdomen is soft. There is no mass.     Tenderness: There is no abdominal tenderness. There is no guarding or rebound.  Musculoskeletal:        General: No tenderness.  Lymphadenopathy:     Cervical: No cervical adenopathy.  Skin:    Findings: No erythema or rash.  Neurological:     Cranial Nerves: No cranial nerve deficit.     Motor: No abnormal muscle tone.     Coordination: Coordination normal.     Gait: Gait abnormal.     Deep Tendon Reflexes: Reflexes normal.  Psychiatric:        Behavior: Behavior normal.        Thought Content: Thought content normal.        Judgment: Judgment normal.     Flat affect Alert,  cooperative   Lab Results  Component Value Date   WBC 8.8 09/03/2017   HGB 12.9 09/03/2017   HCT 39.5 09/03/2017   PLT 171.0 09/03/2017   GLUCOSE 123 (H) 09/03/2017   CHOL 166 05/18/2013   TRIG 111.0 05/18/2013   HDL 51.90 05/18/2013   LDLCALC 92 05/18/2013   ALT 13 09/03/2017   AST 20 09/03/2017   NA 143 09/03/2017   K 3.6 09/03/2017   CL 101 09/03/2017   CREATININE 1.27 (H) 09/03/2017   BUN 31 (H) 09/03/2017   CO2 35 (H) 09/03/2017   TSH 4.05 09/03/2017   INR 1.1 10/17/2008   HGBA1C 5.7 08/31/2012    Mr Brain Wo Contrast  Result Date: 03/15/2015 [facility] NEUROIMAGING REPORT STUDY DATE: 03/15/15 PATIENT NAME: IRA DICENZO DOB: 24-May-1934 MRN: [id number] ORDERING CLINICIAN: Delia Heady, MD CLINICAL HISTORY: 84 year old female with dementia. LZAX study. EXAM: MRI brain (without) TECHNIQUE: MRI of the brain without contrast was obtained following LZAX study protocol. CONTRAST: no IMAGING SITE: [facility] [address] (1.5 Tesla MRI)  FINDINGS: No abnormal lesions are seen on diffusion-weighted views to suggest acute ischemia. The cortical sulci, fissures and cisterns are notable for mild perisylvian and moderate mesial temporal atrophy. Lateral, third and fourth ventricle are normal in size and appearance. No extra-axial fluid collections are seen. No evidence of mass effect or midline shift. Scattered periventricular and subcortical and pontine chronic small vessel ischemic disease. On sagittal views the posterior fossa, pituitary gland and corpus callosum are unremarkable. No evidence of intracranial hemorrhage on gradient-echo views. The orbits and their contents, paranasal sinuses and calvarium are notable for hyperostosis interna.  Intracranial flow voids are present.   Abnormal MRI brain (without) demonstrating: 1. Scattered periventricular and subcortical and pontine chronic small vessel ischemic disease. 2. Mild perisylvian and moderate  mesial temporal atrophy. 3. No acute findings. No intracerebral hemorrhage or amyloid related imaging abnormalities (ARIA). INTERPRETING PHYSICIAN: Suanne Marker, MD Certified in Neurology, Neurophysiology and Neuroimaging [facility] [address] [place], [place] [address] [phone]    Assessment & Plan:   There are no diagnoses linked to this encounter.   Meds ordered this encounter  Medications  . triamcinolone cream (KENALOG) 0.5 %    Sig: Apply 1 application topically 3 (three) times daily.    Dispense:  30 g    Refill:  1     Follow-up: No follow-ups on file.  Sonda Primes, MD

## 2018-04-08 ENCOUNTER — Telehealth: Payer: Self-pay | Admitting: Internal Medicine

## 2018-04-08 NOTE — Telephone Encounter (Signed)
Letter has been printed and Husband has been informed.

## 2018-04-08 NOTE — Telephone Encounter (Signed)
Copied from CRM [id number]. Topic: Quick Communication - See Telephone Encounter >> Apr 08, 2018  2:02 PM Jens Som A wrote: CRM for notification. See Telephone encounter for: 04/08/18.  Mr. Agustin, Patient husband is calling because a letter was diagnosed will memory problem. The original copy was destroyed by the bank. Can a duplicated letter be printed 10/17/2015. Please advise [phone]  The spouse can pick the letter to return back to the bank.

## 2018-04-11 ENCOUNTER — Telehealth: Payer: Self-pay | Admitting: *Deleted

## 2018-04-11 NOTE — Telephone Encounter (Signed)
Dr.Sethi will address the note work on Monday when he returns to the office.

## 2018-04-11 NOTE — Telephone Encounter (Signed)
error 

## 2018-04-11 NOTE — Telephone Encounter (Signed)
Pt husband called, need a copy of  Letter from 2017 stating pt has memory problems and is unable to take care oh her finances. Copy not in epic.

## 2018-04-20 NOTE — Telephone Encounter (Signed)
Letter done for patient.Put in mail for pts husband.

## 2018-06-09 ENCOUNTER — Telehealth: Payer: Self-pay

## 2018-06-09 NOTE — Telephone Encounter (Signed)
Patient's husband declined a webex. Patient's husband stated that he would to speak with the Dr. Pearlean Brownie. He stated that his wife has declined since her last office visit. Verbal consent to file insurance.

## 2018-06-09 NOTE — Telephone Encounter (Signed)
Ok I will do telephone visit next week

## 2018-06-09 NOTE — Telephone Encounter (Signed)
PT is a Alzheimer visit,and he decline video visit.Will send to Dr. Pearlean Brownie if he would like for pt to r/s when feasible for office visit.

## 2018-06-16 ENCOUNTER — Other Ambulatory Visit: Payer: Self-pay

## 2018-06-16 ENCOUNTER — Ambulatory Visit (INDEPENDENT_AMBULATORY_CARE_PROVIDER_SITE_OTHER): Payer: MEDICARE | Admitting: Neurology

## 2018-06-16 DIAGNOSIS — G301 Alzheimer's disease with late onset: Secondary | ICD-10-CM | POA: Diagnosis not present

## 2018-06-16 DIAGNOSIS — F0280 Dementia in other diseases classified elsewhere without behavioral disturbance: Secondary | ICD-10-CM | POA: Diagnosis not present

## 2018-06-16 NOTE — Progress Notes (Signed)
Virtual Visit via Telephone Note  I connected with Courtney Day on 06/16/18 at  1:00 PM EDT by telephone and verified that I am speaking with the correct person using two identifiers.  The patient and her husband were at their home and I was in my office.  I discussed the limitations, risks, security and privacy concerns of performing an evaluation and management service by telephone and the availability of in person appointments. I also discussed with the patient that there may be a patient responsible charge related to this service. The patient expressed understanding and agreed to proceed.   History of Present Illness: Courtney Day is seen today for virtual telephonic visit due to constraints from the coronavirus pandemic.  Her husband informs me that Alzheimer's dementia appears to have progressed.  She is mostly at home now.  Due to the pandemic part-time caregiver is no longer coming.  She at times does not take her medications and spits it out.  She has not had any episodes of agitation delusions or hallucinations.  She is still able to walk slowly but has to be careful.  There have been no falls or injuries.  The patient has dropped out of the Alzheimer's dementia trial and withdrew consent as it was difficult to bring her for appointments and take her out of the house.  Her husband has quit with job and is now the sole full-time caregiver.  She had a primary care visit in January but there have been no medication changes or new medical issues identified.   Observations/Objective: Physical and neurological exam was not performed due to constraints of telephonic virtual visit.  Assessment and Plan: 84 year old African-American lady with advanced Alzheimer's dementia which has been progressing in the last couple of years.  She was participating in the [place] [facility] dementia trial but withdrew consent due to worsening of her condition  Follow Up Instructions: I had a long discussion the patient and  her husband regarding her advanced Alzheimer's dementia unfortunately there are no further medication options available. I recommend she continue Aricept 23 mg daily and  Patient needs 24-hour Constant supervision.   No routine scheduled follow-up appointment is necessary but the patient may be seen only as needed basis. I discussed the assessment and treatment plan with the patient. The patient was provided an opportunity to ask questions and all were answered. The patient agreed with the plan and demonstrated an understanding of the instructions.   The patient was advised to call back or seek an in-person evaluation if the symptoms worsen or if the condition fails to improve as anticipated.  I provided 15 minutes of non-face-to-face time during this encounter.   Delia Heady, MD

## 2018-07-02 DIAGNOSIS — Z0389 Encounter for observation for other suspected diseases and conditions ruled out: Secondary | ICD-10-CM | POA: Diagnosis not present

## 2018-07-02 DIAGNOSIS — G309 Alzheimer's disease, unspecified: Secondary | ICD-10-CM | POA: Diagnosis not present

## 2018-07-02 DIAGNOSIS — I82412 Acute embolism and thrombosis of left femoral vein: Secondary | ICD-10-CM | POA: Diagnosis not present

## 2018-07-02 DIAGNOSIS — R159 Full incontinence of feces: Secondary | ICD-10-CM | POA: Diagnosis not present

## 2018-07-02 DIAGNOSIS — R600 Localized edema: Secondary | ICD-10-CM | POA: Diagnosis not present

## 2018-07-02 DIAGNOSIS — M1712 Unilateral primary osteoarthritis, left knee: Secondary | ICD-10-CM | POA: Diagnosis not present

## 2018-07-02 DIAGNOSIS — R404 Transient alteration of awareness: Secondary | ICD-10-CM | POA: Diagnosis not present

## 2018-07-02 DIAGNOSIS — F0280 Dementia in other diseases classified elsewhere without behavioral disturbance: Secondary | ICD-10-CM | POA: Diagnosis not present

## 2018-07-02 DIAGNOSIS — Y998 Other external cause status: Secondary | ICD-10-CM | POA: Diagnosis not present

## 2018-07-02 DIAGNOSIS — Z79899 Other long term (current) drug therapy: Secondary | ICD-10-CM | POA: Diagnosis not present

## 2018-07-02 DIAGNOSIS — W1839XA Other fall on same level, initial encounter: Secondary | ICD-10-CM | POA: Diagnosis not present

## 2018-07-02 DIAGNOSIS — I82432 Acute embolism and thrombosis of left popliteal vein: Secondary | ICD-10-CM | POA: Diagnosis not present

## 2018-07-02 DIAGNOSIS — I82411 Acute embolism and thrombosis of right femoral vein: Secondary | ICD-10-CM | POA: Diagnosis not present

## 2018-07-02 DIAGNOSIS — R8271 Bacteriuria: Secondary | ICD-10-CM | POA: Diagnosis not present

## 2018-07-02 DIAGNOSIS — R609 Edema, unspecified: Secondary | ICD-10-CM | POA: Diagnosis not present

## 2018-07-02 DIAGNOSIS — M7989 Other specified soft tissue disorders: Secondary | ICD-10-CM | POA: Diagnosis not present

## 2018-07-02 DIAGNOSIS — S0990XA Unspecified injury of head, initial encounter: Secondary | ICD-10-CM | POA: Diagnosis not present

## 2018-07-02 DIAGNOSIS — R392 Extrarenal uremia: Secondary | ICD-10-CM | POA: Diagnosis not present

## 2018-07-02 DIAGNOSIS — S79912A Unspecified injury of left hip, initial encounter: Secondary | ICD-10-CM | POA: Diagnosis not present

## 2018-07-02 DIAGNOSIS — E1165 Type 2 diabetes mellitus with hyperglycemia: Secondary | ICD-10-CM | POA: Diagnosis not present

## 2018-07-02 DIAGNOSIS — R296 Repeated falls: Secondary | ICD-10-CM | POA: Diagnosis not present

## 2018-07-02 DIAGNOSIS — R32 Unspecified urinary incontinence: Secondary | ICD-10-CM | POA: Diagnosis not present

## 2018-07-02 DIAGNOSIS — Z7409 Other reduced mobility: Secondary | ICD-10-CM | POA: Diagnosis not present

## 2018-07-02 DIAGNOSIS — M25552 Pain in left hip: Secondary | ICD-10-CM | POA: Diagnosis not present

## 2018-07-02 DIAGNOSIS — N3001 Acute cystitis with hematuria: Secondary | ICD-10-CM | POA: Diagnosis not present

## 2018-07-02 DIAGNOSIS — I82512 Chronic embolism and thrombosis of left femoral vein: Secondary | ICD-10-CM | POA: Diagnosis not present

## 2018-07-02 DIAGNOSIS — S8992XA Unspecified injury of left lower leg, initial encounter: Secondary | ICD-10-CM | POA: Diagnosis not present

## 2018-07-02 DIAGNOSIS — R531 Weakness: Secondary | ICD-10-CM | POA: Diagnosis not present

## 2018-07-02 DIAGNOSIS — W19XXXA Unspecified fall, initial encounter: Secondary | ICD-10-CM | POA: Diagnosis not present

## 2018-07-03 DIAGNOSIS — Z7409 Other reduced mobility: Secondary | ICD-10-CM | POA: Diagnosis not present

## 2018-07-03 DIAGNOSIS — R296 Repeated falls: Secondary | ICD-10-CM | POA: Diagnosis not present

## 2018-07-03 DIAGNOSIS — I82412 Acute embolism and thrombosis of left femoral vein: Secondary | ICD-10-CM | POA: Diagnosis not present

## 2018-07-03 DIAGNOSIS — R8271 Bacteriuria: Secondary | ICD-10-CM | POA: Diagnosis not present

## 2018-07-03 DIAGNOSIS — R392 Extrarenal uremia: Secondary | ICD-10-CM | POA: Diagnosis not present

## 2018-07-03 DIAGNOSIS — G309 Alzheimer's disease, unspecified: Secondary | ICD-10-CM | POA: Diagnosis not present

## 2018-07-03 DIAGNOSIS — R531 Weakness: Secondary | ICD-10-CM | POA: Diagnosis not present

## 2018-07-04 ENCOUNTER — Telehealth: Payer: Self-pay | Admitting: *Deleted

## 2018-07-04 NOTE — Telephone Encounter (Signed)
Called pt there was no answer LMOM RTC...lmb 

## 2018-07-04 NOTE — Telephone Encounter (Signed)
Rec'd msg on the [facility] Portal stating on 07/02/18 @ 12:12 PM pt was admitted to [facility] observation w/ Diagnoses of Acute embolism and thrombosis of left femoral vein. Pt was Discharged from [facility] on 07/03/18 @ 2:22 pm. Will contact pt to follow-up and make hosp f/u appt if need.Marland Kitchen/LMB

## 2018-07-04 NOTE — Telephone Encounter (Signed)
Patient returned your call. Please advise on how to schedule.

## 2018-07-05 DIAGNOSIS — I82409 Acute embolism and thrombosis of unspecified deep veins of unspecified lower extremity: Secondary | ICD-10-CM | POA: Insufficient documentation

## 2018-07-05 MED ORDER — LACTATED RINGERS IV SOLN
INTRAVENOUS | Status: DC
Start: ? — End: 2018-07-05

## 2018-07-05 MED ORDER — RIVAROXABAN 15 MG PO TABS
15.00 | ORAL_TABLET | ORAL | Status: DC
Start: 2018-07-03 — End: 2018-07-05

## 2018-07-05 MED ORDER — DONEPEZIL HCL 10 MG PO TABS
10.00 | ORAL_TABLET | ORAL | Status: DC
Start: 2018-07-03 — End: 2018-07-05

## 2018-07-05 NOTE — Telephone Encounter (Signed)
Called pt back spoke w/husband. He states wife has dementia and wouldn't understand anything so he can speak for her. He confirm that wife was in [facility] and discharged on Sunday. He states he would like to bring wife in to have MD to look at her arm/elbow which is blistering. Inform husband Dr. Posey Rea is out of the office, but will schedule w/another MD. Completed TCM call below.Raechel Chute  Transition Care Management Follow-up Telephone Call   Date discharged? 07/03/18   How have you been since you were released from the hospital? Spoke w/husband and he states she is doing ok   Do you understand why you were in the hospital? YES, but husband states he was not satisfy with the care at the hospital at all. They only gave her some xarelto to take twice a day, and then release her the following day   Do you understand the discharge instructions? YES   Where were you discharged to? Home   Items Reviewed:  Medications reviewed: YES, she is now taking Xarelto twice a day, which she will not swallow. He states he has been crushing pill up into her applesauce. He's not sure what the milligram is. He states he was getting ready to give his wife a bath  Allergies reviewed: YES, he states no changes  Dietary changes reviewed: YES  Referrals reviewed: No referral recommeded   Functional Questionnaire:   Activities of Daily Living (ADLs):   He states she are independent in the following: feeding, continence, grooming and toileting States they require assistance with the following: ambulation, bathing and hygiene and dressing due to her dementia. He states he/family helps out   Any transportation issues/concerns?: NO   Any patient concerns? NO   Confirmed importance and date/time of follow-up visits scheduled YES, appt 07/06/18  Provider Appointment booked with Dr. Lawerance Bach  Confirmed with patient if condition begins to worsen call PCP or go to the ER.   Patient was given the office number and encouraged to call back with question or concerns.  : YES

## 2018-07-05 NOTE — Telephone Encounter (Signed)
FYI../lmb 

## 2018-07-05 NOTE — Assessment & Plan Note (Addendum)
07/02/2018: Ultrasound at [facility] shows acute DVT left lower extremity, but there is also evidence of a chronic DVT which may have been when she had swelling in the leg previously Started on Xarelto-continue.  Advised him to consider calling his insurance company to see if Eliquis would be cheaper.  Discussed that we can always consider Coumadin if needed-he is familiar with Coumadin Follow-up with Dr. Posey Rea in 1 month, sooner if needed

## 2018-07-05 NOTE — Progress Notes (Signed)
Subjective:    Patient ID: Courtney Day, female    DOB: Jun 14, 1934, 84 y.o.   MRN: [id number]  HPI The patient is here for follow up from the hospital.  Her husband is with her due to her having dementia.   Admitted to [facility]  07/02/2018- 07/03/18.  Admit diagnosis: acute left femoral DVT.   One week prior to going to the ED her husband noticed she had left leg swelling.  He states that the past and he applied ice and resolved, so he started placing ice packs on her leg.  He thought the swelling was improving for a few days, but then stopped improving.  The day that she went to the emergency room she fell in the bedroom.  He is unsure how she fell, but she was on the floor and he figured there was probably no way she could have not hit her head.  EMS brought her to the hospital primarily for the fall to make sure she did not have a head injury, but he also stated that the left leg should be looked at..  In the emergency room CT of the head showed no acute abnormality and x-ray of the right knee showed no fracture.  Chest x-ray showed no acute cardiopulmonary process..  Doppler of the left leg showed an acute DVT.  Because she was thought not to be a good candidate for Coumadin because of difficulty with compliance with INR monitoring she was placed on Xarelto.  She was found to have mild uremia and was given IV fluids.  Urinalysis showed bacteriuria.  White blood cell count was mildly elevated.  She denied any symptoms, but has Alzheimer's..   Medication changes: Started on Xarelto 15 mg twice daily for 20 days, then 20 mg daily  Continued on Aricept 23 mg nightly  Discontinued: Hydrochlorothiazide 12.5 mg, triamterene-hydrochlorothiazide 37.5-25 mg, Seroquel 50 mg    Acute DVT left lower extremity: Ultrasound imaging ED at [facility] on 07/02/2018 showed no acute DVT in the common femoral vein with chronic appearing nonocclusive DVT in the femoral and  popliteal veins Started on Xarelto twice daily for 21 days then once a day  Mild uremia: Improved with IVF Hydrochlorothiazide and triamterene-hydrochlorothiazide discontinued due to poor oral intake, mild dehydration  Asymptomatic bacteriuria: She denied any urinary symptoms No fever, white blood cell count 11, which was thought to be with acute DVT-decreased to 7.3 on day of discharge 5/10 No antibiotics given  Alzheimer's disease Continued on Aricept Seroquel was discontinued  Weakness and immobility with falls She did not cooperate with PT evaluation Per husband she does very little at home   Injury: When her husband picked her up from the hosptial she had a gauze on the right antecubital region and the skin under the gauze was blistered and red.  He is unsure what happened and no one told him what happened.  This is likely where her IV was.  He has been applying a couple of over-the-counter ointments, but was concerned about this area.  Acute DVT left lower extremity: She is taking the Xarelto as prescribed.  He states prescription is expensive.  She has not had any unusual bleeding.  He states that she typically does not fall at home.  Her left leg is swollen, but she does not complain of any pain.  She does say it is painful if it is touched.  Hypertension: He is not given her either the  triamterene-hydrochlorothiazide or hydrochlorothiazide that were discontinued in the hospital.  She does have swelling in her legs.  Her blood pressure at home has been very good without the medication.   Medications and allergies reviewed with patient and updated if appropriate.  Patient Active Problem List   Diagnosis Date Noted   Acute DVT (deep venous thrombosis) (HCC) 07/05/2018   Well adult exam 05/18/2013   Right-sided chest wall pain 09/01/2010   BREAST PAIN 03/18/2010   Alzheimer's disease (HCC) 11/26/2009   Vitiligo 11/26/2009   PULMONARY NODULE 05/22/2008   HYPOKALEMIA  05/14/2008   VERTIGO 05/14/2008   ALLERGIC RHINITIS 04/27/2008   Asthma 04/27/2008   UPPER RESPIRATORY INFECTION (URI) 03/26/2008   Nonspecific (abnormal) findings on radiological and other examination of body structure 03/26/2008   CHEST XRAY, ABNORMAL 03/26/2008   SINUSITIS, ACUTE 02/28/2008   Osteoarthritis 07/06/2007   SHOULDER PAIN 07/06/2007   LEG PAIN 05/03/2007   Situational depression 12/24/2006   Essential hypertension 12/24/2006   LOW BACK PAIN 12/24/2006    Current Outpatient Medications on File Prior to Visit  Medication Sig Dispense Refill   Cholecalciferol (VITAMIN D3) 1.25 MG (50000 UT) CAPS Take 1 capsule by mouth every 30 (thirty) days. 3 capsule 4   donepezil (ARICEPT) 23 MG TABS tablet Take 1 tablet (23 mg total) by mouth at bedtime. 90 tablet 3   hydrochlorothiazide (HYDRODIURIL) 12.5 MG tablet Take 1 tablet (12.5 mg total) by mouth every morning. Annual appt is overdue  must see provider for future refills 90 tablet 3   triamcinolone cream (KENALOG) 0.5 % Apply 1 application topically 3 (three) times daily. 30 g 1   triamterene-hydrochlorothiazide (MAXZIDE-25) 37.5-25 MG tablet TAKE 1 TABLET BY MOUTH EVERY DAY 90 tablet 2   No current facility-administered medications on file prior to visit.     Past Medical History:  Diagnosis Date   Allergy    Asthma    > probably all pseudoasthma, -D/C ACE March 30,2010 > pseudowheeze resolved,cough resolved SPN RLL   Dementia (HCC) 2010   Dr. Terrace Arabia   Depression    Hypertension    Knee pain    Bilateral   Low back pain    Osteoarthritis    Vitiligo 2010    Past Surgical History:  Procedure Laterality Date   ABDOMINAL HYSTERECTOMY     hystoplasmosis lung biopsy- 2010 Dr. Wenda Overland      Social History   Socioeconomic History   Marital status: Married    Spouse name: Not on file   Number of children: Not on file   Years of education: Not on file   Highest education level: Not  on file  Occupational History   Occupation: Retired    Associate Professor: RETIRED  Ecologist strain: Not on file   Food insecurity:    Worry: Not on file    Inability: Not on file   Transportation needs:    Medical: Not on file    Non-medical: Not on file  Tobacco Use   Smoking status: Never Smoker   Smokeless tobacco: Never Used  Substance and Sexual Activity   Alcohol use: No   Drug use: No   Sexual activity: Not on file  Lifestyle   Physical activity:    Days per week: Not on file    Minutes per session: Not on file   Stress: Not on file  Relationships   Social connections:    Talks on phone: Not on file  Gets together: Not on file    Attends religious service: Not on file    Active member of club or organization: Not on file    Attends meetings of clubs or organizations: Not on file    Relationship status: Not on file  Other Topics Concern   Not on file  Social History Narrative   Last heavy exposure 1994    Family History  Problem Relation Age of Onset   Cancer Mother    Cancer Sister        smoker   Multiple sclerosis Son    Hypertension Other     Review of Systems  Unable to perform ROS: Dementia       Objective:   Vitals:   07/06/18 1349  BP: 128/62  Pulse: 62  Resp: 16  SpO2: 99%   BP Readings from Last 3 Encounters:  07/06/18 128/62  03/07/18 126/80  11/09/17 132/70   Wt Readings from Last 3 Encounters:  03/07/18 152 lb (68.9 kg)  11/09/17 155 lb (70.3 kg)  09/03/17 145 lb (65.8 kg)   Body mass index is 27.8 kg/m.   07/02/2018: LAB-VENOUS DUPLEX-LOWER EXTREMITY (DVT) U. Indications for Study:Swelling. Conclusions Summary 1. There is a focal, acute-appearing DVT in the common femoral vein, with chronic appearing nonocclusive DVT in the femoral and popliteal veins. There is no evidence of superficial vein thrombophlebitis in the LEFT lower extremity. 2. There is no evidence of DVT or superficial  vein thrombophlebitis in the RIGHT lower extremity.   CT HEAD WITHOUT CONTRAST  TECHNIQUE: Contiguous axial images were obtained from the base of the skull through the vertex without intravenous contrast.  COMPARISON: 03/15/2015 MR and prior studies  FINDINGS: Brain: No evidence of acute infarction, hemorrhage, hydrocephalus, extra-axial collection or mass lesion/mass effect.  Moderate atrophy and chronic small-vessel white matter ischemic changes are again noted.  Vascular: No hyperdense vessel or unexpected calcification.  Skull: Normal. Negative for fracture or focal lesion.  Sinuses/Orbits: No acute finding. A RIGHT mastoid effusion is again noted.  Other: None.  IMPRESSION: 1. No evidence of acute intracranial abnormality. 2. Atrophy and chronic small-vessel white matter ischemic changes.   XR KNEE LT 4 OR MORE VIEWS AP, Lateral, Oblique  Narrative  CLINICAL DATA: Left knee swelling after fall.  EXAM: LEFT KNEE - COMPLETE 4+ VIEW  COMPARISON: None.  FINDINGS: No evidence of fracture, dislocation, or joint effusion. Mild narrowing of the medial and lateral joint space is noted. Soft tissues are unremarkable.  IMPRESSION: Mild degenerative joint disease. No acute abnormality seen in the left knee.   CHEST - 2 VIEW  COMPARISON: None.  FINDINGS: Normal mediastinum and cardiac silhouette. Normal pulmonary vasculature. No evidence of effusion, infiltrate, or pneumothorax. No acute bony abnormality.  IMPRESSION: No acute cardiopulmonary process.   DG HIP (WITH OR WITHOUT PELVIS) 2-3V LEFT  COMPARISON: None.  FINDINGS: Intact left hip. No acute displaced fracture or malalignment. Hips and bony pelvis appear symmetric and intact. Degenerative changes of lumbar spine and SI joints. Minor hip degenerative change. Postop clips in the left hemipelvis. Nonobstructive bowel gas pattern.  IMPRESSION: Degenerative changes. No acute finding by plain  radiography.   Blood work: Collection Time: 07/03/18 5:54 AM  Result Value Ref Range  WBC 7.3 4.0 - 10.5 x 10*3/uL  RBC 4.26 3.87 - 5.11 x 10*6/uL  Hemoglobin 11.6 (L) 12.0 - 15.0 G/DL  Hematocrit 11.9 (L) 14.7 - 46.0 %  MCV 83.6 78.0 - 100.0 FL  MCH 27.2 26.0 -  34.0 PG  MCHC 32.5 30.0 - 36.0 G/DL  RDW 16.115.8 (H) 09.611.5 - 04.515.5 %  Platelets 210 150 - 400 X 10*3/uL  MPV 8.8 7.2 - 11.0 FL  Basic Metabolic Panel  Collection Time: 07/03/18 5:54 AM  Result Value Ref Range  Sodium 141 135 - 146 MMOL/L  Potassium 3.8 3.5 - 5.3 MMOL/L  Chloride 108 98 - 110 MMOL/L  CO2 29 23 - 30 MMOL/L  BUN 22 8 - 24 MG/DL  Glucose 93 70 - 99 MG/DL  Creatinine 4.090.88 8.110.50 - 1.50 MG/DL  Calcium 9.0 8.5 - 91.410.5 MG/DL  Anion Gap 4 4 - 14 MMOL/L  Est. GFR African American 71 >=60 ML/MIN/1.73 M*2     Physical Exam    Constitutional: Appears well-developed and well-nourished. No distress.  HENT:  Head: Normocephalic and atraumatic.  Neck: Neck supple. No tracheal deviation present. No thyromegaly present.  No cervical lymphadenopathy Cardiovascular: Normal rate, regular rhythm and normal heart sounds.   No murmur heard.   1+ right lower extremity edema, 2+ left lower extremity edema  Pulmonary/Chest: Effort normal and breath sounds normal. No respiratory distress. No has no wheezes. No rales. Abdomen: Soft, nontender, nondistended Skin: Skin is warm and dry. Not diaphoretic.  Right antecubital area with 3 small blisters and 2 larger areas where blisters have broken.  Slight erythema around each, no discharge, nontender, no swelling Psychiatric: Normal mood and affect. Behavior is normal.      Assessment & Plan:    See Problem List for Assessment and Plan of chronic medical problems.

## 2018-07-06 ENCOUNTER — Encounter: Payer: Self-pay | Admitting: Internal Medicine

## 2018-07-06 ENCOUNTER — Other Ambulatory Visit: Payer: Self-pay

## 2018-07-06 ENCOUNTER — Ambulatory Visit (INDEPENDENT_AMBULATORY_CARE_PROVIDER_SITE_OTHER): Payer: MEDICARE | Admitting: Internal Medicine

## 2018-07-06 VITALS — BP 128/62 | HR 62 | Resp 16 | Ht 62.0 in

## 2018-07-06 DIAGNOSIS — R21 Rash and other nonspecific skin eruption: Secondary | ICD-10-CM | POA: Diagnosis not present

## 2018-07-06 DIAGNOSIS — F0280 Dementia in other diseases classified elsewhere without behavioral disturbance: Secondary | ICD-10-CM

## 2018-07-06 DIAGNOSIS — I10 Essential (primary) hypertension: Secondary | ICD-10-CM

## 2018-07-06 DIAGNOSIS — G301 Alzheimer's disease with late onset: Secondary | ICD-10-CM

## 2018-07-06 DIAGNOSIS — I82412 Acute embolism and thrombosis of left femoral vein: Secondary | ICD-10-CM

## 2018-07-06 MED ORDER — KNEE COMPRESSION SLEEVE/S/M MISC: 1.0000 | Freq: Every day | 0 refills | Status: DC

## 2018-07-06 NOTE — Assessment & Plan Note (Signed)
The blisters and reaction in her right antecubital area is likely from the bandage that she had placed in the hospital No evidence of active infection Advised applying a topical antibacterial ointment and monitoring closely

## 2018-07-06 NOTE — Assessment & Plan Note (Signed)
Hydrochlorothiazide and triamterene-hydrochlorothiazide discontinued while she was in the emergency room Blood pressure has been well controlled these past few days without the medication, but it may need to be restarted because of her lower extremity edema He will continue to monitor her blood pressure at home Can try compression socks Advised that he can restart 1 of these medications as needed for the edema Follow-up with Dr. Posey Rea in 1 month

## 2018-07-06 NOTE — Patient Instructions (Addendum)
Continue the xarelto as prescribed.  Start one of the water pills back if needed if her leg swelling gets worse.  Monitor her blood pressure.  Try using a compression sock on the left leg.   Apply anti-bacterial ointment to the right arm and monitor closely for infection.    Follow up with Dr Posey Rea in one month, sooner if needed

## 2018-07-06 NOTE — Assessment & Plan Note (Signed)
Continue Aricept 

## 2018-07-18 ENCOUNTER — Other Ambulatory Visit: Payer: Self-pay

## 2018-07-18 ENCOUNTER — Encounter (HOSPITAL_COMMUNITY): Payer: Self-pay | Admitting: Emergency Medicine

## 2018-07-18 ENCOUNTER — Emergency Department (HOSPITAL_COMMUNITY)
Admission: EM | Admit: 2018-07-18 | Discharge: 2018-07-19 | Disposition: A | Discharge status: Home / Self Care | Payer: MEDICARE | Attending: Emergency Medicine | Admitting: Emergency Medicine

## 2018-07-18 DIAGNOSIS — R5381 Other malaise: Secondary | ICD-10-CM | POA: Diagnosis not present

## 2018-07-18 DIAGNOSIS — I10 Essential (primary) hypertension: Secondary | ICD-10-CM | POA: Diagnosis not present

## 2018-07-18 DIAGNOSIS — Z79899 Other long term (current) drug therapy: Secondary | ICD-10-CM | POA: Insufficient documentation

## 2018-07-18 DIAGNOSIS — G309 Alzheimer's disease, unspecified: Secondary | ICD-10-CM | POA: Insufficient documentation

## 2018-07-18 DIAGNOSIS — R531 Weakness: Secondary | ICD-10-CM | POA: Insufficient documentation

## 2018-07-18 DIAGNOSIS — F0390 Unspecified dementia without behavioral disturbance: Secondary | ICD-10-CM | POA: Diagnosis not present

## 2018-07-18 DIAGNOSIS — R609 Edema, unspecified: Secondary | ICD-10-CM | POA: Diagnosis not present

## 2018-07-18 DIAGNOSIS — J45909 Unspecified asthma, uncomplicated: Secondary | ICD-10-CM | POA: Diagnosis not present

## 2018-07-18 LAB — URINALYSIS, ROUTINE W REFLEX MICROSCOPIC
Bacteria, UA: NONE SEEN
Bilirubin Urine: NEGATIVE
Glucose, UA: NEGATIVE mg/dL
Ketones, ur: NEGATIVE mg/dL
Leukocytes,Ua: NEGATIVE
Nitrite: NEGATIVE
Protein, ur: NEGATIVE mg/dL
Specific Gravity, Urine: 1.023 (ref 1.005–1.030)
pH: 5 (ref 5.0–8.0)

## 2018-07-18 LAB — CBC WITH DIFFERENTIAL/PLATELET
Abs Immature Granulocytes: 0.03 10*3/uL (ref 0.00–0.07)
Basophils Absolute: 0 10*3/uL (ref 0.0–0.1)
Basophils Relative: 0 %
Eosinophils Absolute: 0.2 10*3/uL (ref 0.0–0.5)
Eosinophils Relative: 2 %
HCT: 35.4 % — ABNORMAL LOW (ref 36.0–46.0)
Hemoglobin: 10.9 g/dL — ABNORMAL LOW (ref 12.0–15.0)
Immature Granulocytes: 0 %
Lymphocytes Relative: 24 %
Lymphs Abs: 2 10*3/uL (ref 0.7–4.0)
MCH: 26.8 pg (ref 26.0–34.0)
MCHC: 30.8 g/dL (ref 30.0–36.0)
MCV: 87.2 fL (ref 80.0–100.0)
Monocytes Absolute: 0.7 10*3/uL (ref 0.1–1.0)
Monocytes Relative: 8 %
Neutro Abs: 5.5 10*3/uL (ref 1.7–7.7)
Neutrophils Relative %: 66 %
Platelets: 282 10*3/uL (ref 150–400)
RBC: 4.06 MIL/uL (ref 3.87–5.11)
RDW: 15.7 % — ABNORMAL HIGH (ref 11.5–15.5)
WBC: 8.3 10*3/uL (ref 4.0–10.5)
nRBC: 0 % (ref 0.0–0.2)

## 2018-07-18 LAB — COMPREHENSIVE METABOLIC PANEL
ALT: 17 U/L (ref 0–44)
AST: 21 U/L (ref 15–41)
Albumin: 3 g/dL — ABNORMAL LOW (ref 3.5–5.0)
Alkaline Phosphatase: 63 U/L (ref 38–126)
Anion gap: 11 (ref 5–15)
BUN: 30 mg/dL — ABNORMAL HIGH (ref 8–23)
CO2: 21 mmol/L — ABNORMAL LOW (ref 22–32)
Calcium: 8.9 mg/dL (ref 8.9–10.3)
Chloride: 115 mmol/L — ABNORMAL HIGH (ref 98–111)
Creatinine, Ser: 0.94 mg/dL (ref 0.44–1.00)
GFR calc Af Amer: >60 mL/min (ref >60)
GFR calc non Af Amer: 57 mL/min — ABNORMAL LOW (ref >60)
Glucose, Bld: 96 mg/dL (ref 70–99)
Potassium: 3.7 mmol/L (ref 3.5–5.1)
Sodium: 147 mmol/L — ABNORMAL HIGH (ref 135–145)
Total Bilirubin: 0.5 mg/dL (ref 0.3–1.2)
Total Protein: 6.8 g/dL (ref 6.5–8.1)

## 2018-07-18 LAB — TROPONIN I: Troponin I: <0.03 ng/mL (ref <0.03)

## 2018-07-18 NOTE — ED Triage Notes (Signed)
Pt BIB [facility] from home. Per husband patient has been having increased weakness that started this morning. Patient normally able to ambulate with walker and she was unable to get out of bed today. Presents with left leg swelling and edema as well. Hx of Alzeihmer's, and was started on Xarelto this month for blood clots. Alert to self only-baseline. Husband's number [phone].

## 2018-07-18 NOTE — ED Provider Notes (Signed)
[facility]   CSN: [id number] Arrival date & time: 07/18/18  1707    History   Chief Complaint Chief Complaint  Patient presents with  . Weakness    HPI Courtney Day is a 84 y.o. female who presents with generalized weakness and inability to walk.  Past medical history significant for dementia, recent history of left DVT currently on Xarelto.  The patient's husband provides all of the history as the patient has advanced dementia and cannot provide a meaningful history.  He states that they [facility] May [facility] after a fall and left leg swelling.  She was diagnosed with a DVT at that time.  She also had CT head at that time due to the fall which was negative.  Labs showed mild anemia and mildly elevated BUN/creatinine.  She had asymptomatic bacteriuria and was not treated with antibiotics.  Patient's husband states that today the patient had an assisted fall to the floor.  She did not injure herself but usually the patient can get up off of the floor on her own however he had to pull her up which was very difficult.  After that she was not able to get up at all.  She does have generalized weakness at baseline and walks with a walker but has frequent falls.  He denies that she hit her head today.  He is concerned about UTI since her urine is very malodorous.  She has not had any fever or vomiting.  HPI  Past Medical History:  Diagnosis Date  . Allergy   . Asthma    > probably all pseudoasthma, -D/C ACE March 30,2010 > pseudowheeze resolved,cough resolved SPN RLL  . Dementia (HCC) 2010   Dr. Terrace ArabiaYan  . Depression   . Hypertension   . Knee pain    Bilateral  . Low back pain   . Osteoarthritis   . Vitiligo 2010    Patient Active Problem List   Diagnosis Date Noted  . Rash and nonspecific skin eruption 07/06/2018  . Acute DVT (deep venous thrombosis) (HCC) 07/05/2018  . Well adult exam 05/18/2013  .  Right-sided chest wall pain 09/01/2010  . BREAST PAIN 03/18/2010  . Alzheimer's disease (HCC) 11/26/2009  . Vitiligo 11/26/2009  . PULMONARY NODULE 05/22/2008  . HYPOKALEMIA 05/14/2008  . VERTIGO 05/14/2008  . ALLERGIC RHINITIS 04/27/2008  . Asthma 04/27/2008  . UPPER RESPIRATORY INFECTION (URI) 03/26/2008  . Nonspecific (abnormal) findings on radiological and other examination of body structure 03/26/2008  . CHEST XRAY, ABNORMAL 03/26/2008  . SINUSITIS, ACUTE 02/28/2008  . Osteoarthritis 07/06/2007  . SHOULDER PAIN 07/06/2007  . LEG PAIN 05/03/2007  . Situational depression 12/24/2006  . Essential hypertension 12/24/2006  . LOW BACK PAIN 12/24/2006    Past Surgical History:  Procedure Laterality Date  . ABDOMINAL HYSTERECTOMY    . hystoplasmosis lung biopsy- 2010 Dr. Wenda OverlandBurrney       OB History   No obstetric history on file.      Home Medications    Prior to Admission medications   Medication Sig Start Date End Date Taking? Authorizing Provider  Cholecalciferol (VITAMIN D3) 1.25 MG (50000 UT) CAPS Take 1 capsule by mouth every 30 (thirty) days. 03/07/18   Plotnikov, Georgina QuintAleksei V, MD  donepezil (ARICEPT) 23 MG TABS tablet Take 1 tablet (23 mg total) by mouth at bedtime. 06/14/17   Micki RileySethi, Pramod S, MD  Elastic Bandages & Supports (KNEE COMPRESSION  SLEEVE/S/M) MISC 1 each by Does not apply route daily. Put on left leg daily and take off at night 07/06/18   Pincus Sanes, MD  Rivaroxaban 15 & 20 MG TBPK Take 15 mg twice daily for first 20 days, then 20 mg once daily with dinner for remaining treatment. Start date 07/03/18  Stop date TBD 07/03/18   [provider]  triamcinolone cream (KENALOG) 0.5 % Apply 1 application topically 3 (three) times daily. 03/07/18   Plotnikov, Georgina Quint, MD    Family History Family History  Problem Relation Age of Onset  . Cancer Mother   . Cancer Sister        smoker  . Multiple sclerosis Son   . Hypertension Other     Social History  Social History   Tobacco Use  . Smoking status: Never Smoker  . Smokeless tobacco: Never Used  Substance Use Topics  . Alcohol use: No  . Drug use: No     Allergies   Ace inhibitors; Amlodipine besylate-valsartan; Namenda [memantine hcl]; and Valsartan   Review of Systems Review of Systems  Unable to perform ROS: Dementia     Physical Exam Updated Vital Signs BP (!) 164/68 (BP Location: Right Arm)   Pulse (!) 56   Temp 97.9 F (36.6 C) (Axillary)   Resp 17   Ht 5\' 4"  (1.626 m)   Wt 68 kg   SpO2 97%   BMI 25.75 kg/m   Physical Exam Vitals signs and nursing note reviewed.  Constitutional:      General: She is not in acute distress.    Appearance: Normal appearance. She is well-developed. She is not ill-appearing.     Comments: Elderly female in NAD. Oriented to self only. Pleasant  HENT:     Head: Normocephalic and atraumatic.  Eyes:     General: No scleral icterus.       Right eye: No discharge.        Left eye: No discharge.     Conjunctiva/sclera: Conjunctivae normal.     Pupils: Pupils are equal, round, and reactive to light.  Neck:     Musculoskeletal: Normal range of motion.  Cardiovascular:     Rate and Rhythm: Normal rate and regular rhythm.  Pulmonary:     Effort: Pulmonary effort is normal. No respiratory distress.     Breath sounds: Normal breath sounds.  Abdominal:     General: There is no distension.     Palpations: Abdomen is soft.     Tenderness: There is no abdominal tenderness.  Genitourinary:    Comments: Small area of skin breakdown over the right buttocks Musculoskeletal:     Left lower leg: Edema present.     Comments: L left is mildly swollen compared to R. Intact distal pulses  Skin:    General: Skin is warm and dry.  Neurological:     Mental Status: She is alert and oriented to person, place, and time.  Psychiatric:        Behavior: Behavior normal.      ED Treatments / Results  Labs (all labs ordered are listed, but  only abnormal results are displayed) Labs Reviewed  URINALYSIS, ROUTINE W REFLEX MICROSCOPIC - Abnormal; Notable for the following components:      Result Value   Hgb urine dipstick SMALL (*)    All other components within normal limits  CBC WITH DIFFERENTIAL/PLATELET - Abnormal; Notable for the following components:   Hemoglobin 10.9 (*)  HCT 35.4 (*)    RDW 15.7 (*)    All other components within normal limits  COMPREHENSIVE METABOLIC PANEL - Abnormal; Notable for the following components:   Sodium 147 (*)    Chloride 115 (*)    CO2 21 (*)    BUN 30 (*)    Albumin 3.0 (*)    GFR calc non Af Amer 57 (*)    All other components within normal limits  TROPONIN I    EKG EKG Interpretation  Date/Time:  Monday Jul 18 2018 17:18:31 EDT Ventricular Rate:  56 PR Interval:    QRS Duration: 81 QT Interval:  468 QTC Calculation: 452 R Axis:   11 Text Interpretation:  Sinus rhythm No significant change since last tracing Confirmed by Melene Plan [phone]) on 07/18/2018 5:23:21 PM   Radiology No results found.  Procedures Procedures (including critical care time)  Medications Ordered in ED Medications - No data to display   Initial Impression / Assessment and Plan / ED Course  I have reviewed the triage vital signs and the nursing notes.  Pertinent labs & imaging results that were available during my care of the patient were reviewed by me and considered in my medical decision making (see chart for details).  84 year old female presents with difficulty walking, possible UTI. She is hypertensive but otherwise vitals are normal. Exam is unremarkable. She is unable to contribute to her history. I spoke with her husband who is very helpful. He states the main concern was her inability to walk today. It does not sound like she had any significant injury from her assisted fall. Will obtain labs, EKG, UA.  There is a long delay in obtaining labs and urine. EKG is sinus rhythm. CBC is  remarkable for anemia which is slightly worse than baseline. It was 11 at [place] earlier this month. CMP is remarkable for mild hypernatremia. Trop is normal. UA does not show infection. She was ambulated by nursing staff and was able to do this with a walker and some assistance. I called her husband to update him and he states he is unable to pick her up. He will pick her up between 8-9 in the morning.  Final Clinical Impressions(s) / ED Diagnoses   Final diagnoses:  Weakness    ED Discharge Orders    None       Bethel Born, PA-C 07/19/18 0043    Melene Plan, DO 07/19/18 1513

## 2018-07-18 NOTE — ED Notes (Signed)
EDP PA is calling husband for hx and information.

## 2018-07-18 NOTE — ED Notes (Signed)
Pt husband is at bedside. EDP notified.

## 2018-07-19 ENCOUNTER — Other Ambulatory Visit: Payer: Self-pay

## 2018-07-19 NOTE — ED Notes (Signed)
Per Sort RN, pt's spouse advised he is continuing to wait for pt as he was contacted last night stating she was being d/c'd. Advised RN to advise him, per SW, CM is having rollator w/seat delivered to ED then pt will be ready for d/c.

## 2018-07-19 NOTE — Progress Notes (Signed)
[facility] ED TOC CM -referral Home Health  Call received to arrange [facility]. Will need HH RN, PT, OT, aide and SW orders with F2F.

## 2018-07-19 NOTE — ED Notes (Signed)
Pt was able to walk to doorway of the room with a walker and help from staff

## 2018-07-19 NOTE — ED Provider Notes (Signed)
Emergency Medicine Observation Re-evaluation Note  Courtney Day is a 84 y.o. female, seen on rounds today.  Pt initially presented to the ED for complaints of Weakness Currently, the patient is resting comfortably in bed. No problems overnight, plan to discharge this AM.  Physical Exam  BP (!) 156/78 (BP Location: Right Arm)   Pulse 62   Temp 98.2 F (36.8 C) (Oral)   Resp 16   Ht 5\' 4"  (1.626 m)   Wt 68 kg   SpO2 100%   BMI 25.75 kg/m    Physical Exam PE: Constitutional: Pt is oriented to self only, she is pleasant. She is in no acute distress HENT: normocephalic, atraumatic. no cervical adenopathy Cardiovascular: normal rate and rhythm, distal pulses intact Pulmonary/Chest: effort normal; breath sounds clear and equal bilaterally; no wheezes or rales Abdominal: soft and nontender Musculoskeletal: Left lower extremity with minimal swelling compared to right. DP 2+ pulses bilaterally. Neurological: alert with goal directed thinking Skin: warm and dry, no rash, no diaphoresis Psychiatric: normal mood and affect, normal behavior     ED Course / MDM  EKG:EKG Interpretation  Date/Time:  Monday Jul 18 2018 17:18:31 EDT Ventricular Rate:  56 PR Interval:    QRS Duration: 81 QT Interval:  468 QTC Calculation: 452 R Axis:   11 Text Interpretation:  Sinus rhythm No significant change since last tracing Confirmed by Melene Plan [phone]) on 07/18/2018 5:23:21 PM    I have reviewed the labs performed to date as well as medications administered while in observation. No changes in last 24 hours, no new labs ordered. Plan  Current plan is for discharge home with spouse and home health services from [facility]. Pt provided with DME rolling walker. She is stable at discharge.     Kathyrn Lass 07/19/18 1940    Raeford Razor, MD 07/20/18 1215

## 2018-07-19 NOTE — TOC Transition Note (Signed)
Transition of Care [facility]) - CM/SW Discharge Note   Patient Details  Name: Courtney Day MRN: [id number] Date of Birth: April 16, 1934  Transition of Care [facility]) CM/SW Contact:  Oletta Cohn, RN Phone Number: 07/19/2018, 9:59 AM   Clinical Narrative:    Rolla Flatten. Lucretia Roers, RN, BSN, [employer] [phone] Spoke with pt husband via telephone discharge planning for [employer]. Offered pt list of home health agencies to choose from.  Pt husband chose [facility] to render services. [facility] [place] notified. Patient made aware that [facility] will be in contact in 24-48 hours.  No DME needs identified at this time.    Final next level of care: Home w Home Health Services Barriers to Discharge: Barriers Resolved   Patient Goals and CMS Choice Patient states their goals for this hospitalization and ongoing recovery are:: to go home CMS Medicare.gov Compare Post Acute Care list provided to:: Patient Represenative (must comment)(Husband, Ivar Drape) Choice offered to / list presented to : Spouse                         Discharge Plan and Services In-house Referral: Clinical Social Work Discharge Planning Services: CM Consult Post Acute Care Choice: Home Health, Durable Medical Equipment          DME Arranged: Walker rolling DME Agency: [employer] Date DME Agency Contacted: 07/19/18 Time DME Agency Contacted: [phone] Representative spoke with at DME Agency: Zack HH Arranged: RN, Nurse's Aide HH Agency: [facility] Date [facility] Agency Contacted: 07/19/18 Time HH Agency Contacted: 0930 Representative spoke with at [facility] Agency: Lissa Morales

## 2018-07-19 NOTE — ED Notes (Signed)
Pt cleansed d/t incont of urine and stool. Pt refused to eat.

## 2018-07-19 NOTE — ED Notes (Signed)
Pt noted to be resting quietly on bed w/eyes closed. Respirations even, unlabored. Spouse arrived to [facility] - stating he received a phone call to come pick up pt. Advised spouse of tx plan, per shift report, was for CM to set up Home Health. Spouse voiced appreciation and stated he will wait in waiting room for CM. RN left message for [place], [place].

## 2018-07-19 NOTE — ED Notes (Addendum)
Escorted pt to lobby to spouse via w/c after dressing pt - d/c instructions given and questions answered to satisfaction - Aware HH has been set up w/[facility] who should be contacting him w/in 24-48 hours. Also aware pt did not eat breakfast. Rollator walker given to spouse along w/pt's black bag. Pt escorted to spouse's vehicle via w/c.

## 2018-07-19 NOTE — ED Notes (Signed)
Rollator delivered to pt's room.

## 2018-07-21 ENCOUNTER — Telehealth: Payer: Self-pay | Admitting: Internal Medicine

## 2018-07-21 DIAGNOSIS — F0280 Dementia in other diseases classified elsewhere without behavioral disturbance: Secondary | ICD-10-CM | POA: Diagnosis not present

## 2018-07-21 DIAGNOSIS — I82402 Acute embolism and thrombosis of unspecified deep veins of left lower extremity: Secondary | ICD-10-CM | POA: Diagnosis not present

## 2018-07-21 DIAGNOSIS — Z7901 Long term (current) use of anticoagulants: Secondary | ICD-10-CM | POA: Diagnosis not present

## 2018-07-21 DIAGNOSIS — I10 Essential (primary) hypertension: Secondary | ICD-10-CM | POA: Diagnosis not present

## 2018-07-21 DIAGNOSIS — M545 Low back pain: Secondary | ICD-10-CM | POA: Diagnosis not present

## 2018-07-21 DIAGNOSIS — M1990 Unspecified osteoarthritis, unspecified site: Secondary | ICD-10-CM | POA: Diagnosis not present

## 2018-07-21 DIAGNOSIS — Z87440 Personal history of urinary (tract) infections: Secondary | ICD-10-CM | POA: Diagnosis not present

## 2018-07-21 DIAGNOSIS — F329 Major depressive disorder, single episode, unspecified: Secondary | ICD-10-CM | POA: Diagnosis not present

## 2018-07-21 DIAGNOSIS — D649 Anemia, unspecified: Secondary | ICD-10-CM | POA: Diagnosis not present

## 2018-07-21 DIAGNOSIS — Z9181 History of falling: Secondary | ICD-10-CM | POA: Diagnosis not present

## 2018-07-21 DIAGNOSIS — Z90710 Acquired absence of both cervix and uterus: Secondary | ICD-10-CM | POA: Diagnosis not present

## 2018-07-21 DIAGNOSIS — R627 Adult failure to thrive: Secondary | ICD-10-CM | POA: Diagnosis not present

## 2018-07-21 DIAGNOSIS — N179 Acute kidney failure, unspecified: Secondary | ICD-10-CM | POA: Diagnosis not present

## 2018-07-21 DIAGNOSIS — E871 Hypo-osmolality and hyponatremia: Secondary | ICD-10-CM | POA: Diagnosis not present

## 2018-07-21 DIAGNOSIS — L89322 Pressure ulcer of left buttock, stage 2: Secondary | ICD-10-CM | POA: Diagnosis not present

## 2018-07-21 DIAGNOSIS — A419 Sepsis, unspecified organism: Secondary | ICD-10-CM | POA: Diagnosis not present

## 2018-07-21 DIAGNOSIS — G309 Alzheimer's disease, unspecified: Secondary | ICD-10-CM | POA: Diagnosis not present

## 2018-07-21 DIAGNOSIS — N390 Urinary tract infection, site not specified: Secondary | ICD-10-CM | POA: Diagnosis not present

## 2018-07-21 NOTE — Telephone Encounter (Signed)
Copied from CRM [id number]. Topic: General - Other >> Jul 21, 2018  2:17 PM Tamela Oddi wrote: Reason for CRM: Herbert Seta with [facility] called to get verbal orders for patient - PT evaluation, Social worker, Nursing 1x wk for 3 wks.  Please call if there are any questions, it is a secured line so leave a message if there is no answer.  CB# [phone]

## 2018-07-22 ENCOUNTER — Telehealth: Payer: Self-pay | Admitting: Internal Medicine

## 2018-07-22 DIAGNOSIS — N390 Urinary tract infection, site not specified: Secondary | ICD-10-CM | POA: Diagnosis not present

## 2018-07-22 DIAGNOSIS — E871 Hypo-osmolality and hyponatremia: Secondary | ICD-10-CM | POA: Diagnosis not present

## 2018-07-22 DIAGNOSIS — L89322 Pressure ulcer of left buttock, stage 2: Secondary | ICD-10-CM | POA: Diagnosis not present

## 2018-07-22 DIAGNOSIS — N179 Acute kidney failure, unspecified: Secondary | ICD-10-CM | POA: Diagnosis not present

## 2018-07-22 DIAGNOSIS — G309 Alzheimer's disease, unspecified: Secondary | ICD-10-CM | POA: Diagnosis not present

## 2018-07-22 DIAGNOSIS — A419 Sepsis, unspecified organism: Secondary | ICD-10-CM | POA: Diagnosis not present

## 2018-07-22 NOTE — Telephone Encounter (Signed)
Copied from CRM [id number]. Topic: Quick Communication - See Telephone Encounter >> Jul 22, 2018  2:09 PM Lorrine Kin, [place] wrote: CRM for notification. See Telephone encounter for: 07/22/18. Debbie, Child psychotherapist with [facility], calling and states that the patient's husband is wanting to get the patient placed into a facility. States that they are needing an FL2 form completed and to be for skilled level. Please advise.  CB#: [phone]

## 2018-07-22 NOTE — Telephone Encounter (Signed)
LM giving verbals, FYI 

## 2018-07-22 NOTE — Telephone Encounter (Signed)
It is okay with me.  Please get me  FL 2 form.  Thanks

## 2018-07-22 NOTE — Telephone Encounter (Signed)
Please advise 

## 2018-07-23 ENCOUNTER — Emergency Department (HOSPITAL_COMMUNITY): Payer: MEDICARE

## 2018-07-23 ENCOUNTER — Encounter (HOSPITAL_COMMUNITY): Payer: Self-pay

## 2018-07-23 ENCOUNTER — Inpatient Hospital Stay (HOSPITAL_COMMUNITY)
Admission: EM | Admit: 2018-07-23 | Discharge: 2018-07-26 | DRG: 872 | Disposition: A | Discharge status: Skilled Nursing Facility | Payer: MEDICARE | Attending: Internal Medicine | Admitting: Internal Medicine

## 2018-07-23 ENCOUNTER — Other Ambulatory Visit: Payer: Self-pay

## 2018-07-23 DIAGNOSIS — Z888 Allergy status to other drugs, medicaments and biological substances status: Secondary | ICD-10-CM

## 2018-07-23 DIAGNOSIS — Z20828 Contact with and (suspected) exposure to other viral communicable diseases: Secondary | ICD-10-CM | POA: Diagnosis not present

## 2018-07-23 DIAGNOSIS — Z86718 Personal history of other venous thrombosis and embolism: Secondary | ICD-10-CM

## 2018-07-23 DIAGNOSIS — I82512 Chronic embolism and thrombosis of left femoral vein: Secondary | ICD-10-CM | POA: Diagnosis not present

## 2018-07-23 DIAGNOSIS — R410 Disorientation, unspecified: Secondary | ICD-10-CM | POA: Diagnosis not present

## 2018-07-23 DIAGNOSIS — F0280 Dementia in other diseases classified elsewhere without behavioral disturbance: Secondary | ICD-10-CM | POA: Diagnosis present

## 2018-07-23 DIAGNOSIS — I82409 Acute embolism and thrombosis of unspecified deep veins of unspecified lower extremity: Secondary | ICD-10-CM | POA: Diagnosis not present

## 2018-07-23 DIAGNOSIS — R627 Adult failure to thrive: Secondary | ICD-10-CM | POA: Diagnosis present

## 2018-07-23 DIAGNOSIS — Z66 Do not resuscitate: Secondary | ICD-10-CM | POA: Diagnosis present

## 2018-07-23 DIAGNOSIS — Z79899 Other long term (current) drug therapy: Secondary | ICD-10-CM

## 2018-07-23 DIAGNOSIS — I10 Essential (primary) hypertension: Secondary | ICD-10-CM | POA: Diagnosis present

## 2018-07-23 DIAGNOSIS — E870 Hyperosmolality and hypernatremia: Secondary | ICD-10-CM | POA: Diagnosis present

## 2018-07-23 DIAGNOSIS — E860 Dehydration: Secondary | ICD-10-CM | POA: Diagnosis present

## 2018-07-23 DIAGNOSIS — A4151 Sepsis due to Escherichia coli [E. coli]: Principal | ICD-10-CM | POA: Diagnosis present

## 2018-07-23 DIAGNOSIS — Z6824 Body mass index (BMI) 24.0-24.9, adult: Secondary | ICD-10-CM

## 2018-07-23 DIAGNOSIS — R4182 Altered mental status, unspecified: Secondary | ICD-10-CM | POA: Diagnosis not present

## 2018-07-23 DIAGNOSIS — M2550 Pain in unspecified joint: Secondary | ICD-10-CM | POA: Diagnosis not present

## 2018-07-23 DIAGNOSIS — R41841 Cognitive communication deficit: Secondary | ICD-10-CM | POA: Diagnosis not present

## 2018-07-23 DIAGNOSIS — N179 Acute kidney failure, unspecified: Secondary | ICD-10-CM | POA: Diagnosis present

## 2018-07-23 DIAGNOSIS — R32 Unspecified urinary incontinence: Secondary | ICD-10-CM | POA: Diagnosis present

## 2018-07-23 DIAGNOSIS — Z7901 Long term (current) use of anticoagulants: Secondary | ICD-10-CM | POA: Diagnosis not present

## 2018-07-23 DIAGNOSIS — B9620 Unspecified Escherichia coli [E. coli] as the cause of diseases classified elsewhere: Secondary | ICD-10-CM | POA: Diagnosis not present

## 2018-07-23 DIAGNOSIS — A419 Sepsis, unspecified organism: Secondary | ICD-10-CM | POA: Diagnosis not present

## 2018-07-23 DIAGNOSIS — N390 Urinary tract infection, site not specified: Secondary | ICD-10-CM | POA: Diagnosis present

## 2018-07-23 DIAGNOSIS — R6520 Severe sepsis without septic shock: Secondary | ICD-10-CM | POA: Diagnosis not present

## 2018-07-23 DIAGNOSIS — G309 Alzheimer's disease, unspecified: Secondary | ICD-10-CM | POA: Diagnosis present

## 2018-07-23 DIAGNOSIS — R2681 Unsteadiness on feet: Secondary | ICD-10-CM | POA: Diagnosis not present

## 2018-07-23 DIAGNOSIS — R509 Fever, unspecified: Secondary | ICD-10-CM | POA: Diagnosis not present

## 2018-07-23 DIAGNOSIS — R278 Other lack of coordination: Secondary | ICD-10-CM | POA: Diagnosis not present

## 2018-07-23 DIAGNOSIS — R404 Transient alteration of awareness: Secondary | ICD-10-CM | POA: Diagnosis not present

## 2018-07-23 DIAGNOSIS — G301 Alzheimer's disease with late onset: Secondary | ICD-10-CM | POA: Diagnosis not present

## 2018-07-23 DIAGNOSIS — Z7401 Bed confinement status: Secondary | ICD-10-CM | POA: Diagnosis not present

## 2018-07-23 DIAGNOSIS — R531 Weakness: Secondary | ICD-10-CM | POA: Diagnosis not present

## 2018-07-23 LAB — URINALYSIS, ROUTINE W REFLEX MICROSCOPIC
Bilirubin Urine: NEGATIVE
Glucose, UA: NEGATIVE mg/dL
Ketones, ur: NEGATIVE mg/dL
Nitrite: NEGATIVE
Protein, ur: 100 mg/dL — AB
RBC / HPF: >50 RBC/hpf — ABNORMAL HIGH (ref 0–5)
Specific Gravity, Urine: 1.021 (ref 1.005–1.030)
WBC, UA: >50 WBC/hpf — ABNORMAL HIGH (ref 0–5)
pH: 7 (ref 5.0–8.0)

## 2018-07-23 LAB — CBC WITH DIFFERENTIAL/PLATELET
Abs Immature Granulocytes: 0.04 10*3/uL (ref 0.00–0.07)
Basophils Absolute: 0 10*3/uL (ref 0.0–0.1)
Basophils Relative: 0 %
Eosinophils Absolute: 0 10*3/uL (ref 0.0–0.5)
Eosinophils Relative: 0 %
HCT: 42.4 % (ref 36.0–46.0)
Hemoglobin: 12.2 g/dL (ref 12.0–15.0)
Immature Granulocytes: 0 %
Lymphocytes Relative: 11 %
Lymphs Abs: 1.7 10*3/uL (ref 0.7–4.0)
MCH: 26 pg (ref 26.0–34.0)
MCHC: 28.8 g/dL — ABNORMAL LOW (ref 30.0–36.0)
MCV: 90.2 fL (ref 80.0–100.0)
Monocytes Absolute: 1 10*3/uL (ref 0.1–1.0)
Monocytes Relative: 6 %
Neutro Abs: 12.9 10*3/uL — ABNORMAL HIGH (ref 1.7–7.7)
Neutrophils Relative %: 83 %
Platelets: 246 10*3/uL (ref 150–400)
RBC: 4.7 MIL/uL (ref 3.87–5.11)
RDW: 16.1 % — ABNORMAL HIGH (ref 11.5–15.5)
WBC: 15.6 10*3/uL — ABNORMAL HIGH (ref 4.0–10.5)
nRBC: 0 % (ref 0.0–0.2)

## 2018-07-23 LAB — BASIC METABOLIC PANEL
Anion gap: 13 (ref 5–15)
BUN: 35 mg/dL — ABNORMAL HIGH (ref 8–23)
CO2: 19 mmol/L — ABNORMAL LOW (ref 22–32)
Calcium: 9.1 mg/dL (ref 8.9–10.3)
Chloride: 117 mmol/L — ABNORMAL HIGH (ref 98–111)
Creatinine, Ser: 1.16 mg/dL — ABNORMAL HIGH (ref 0.44–1.00)
GFR calc Af Amer: 51 mL/min — ABNORMAL LOW (ref >60)
GFR calc non Af Amer: 44 mL/min — ABNORMAL LOW (ref >60)
Glucose, Bld: 115 mg/dL — ABNORMAL HIGH (ref 70–99)
Potassium: 4.4 mmol/L (ref 3.5–5.1)
Sodium: 149 mmol/L — ABNORMAL HIGH (ref 135–145)

## 2018-07-23 LAB — LACTIC ACID, PLASMA: Lactic Acid, Venous: 2.2 mmol/L (ref 0.5–1.9)

## 2018-07-23 LAB — SARS CORONAVIRUS 2 BY RT PCR (HOSPITAL ORDER, PERFORMED IN ~~LOC~~ HOSPITAL LAB): SARS Coronavirus 2: NEGATIVE

## 2018-07-23 MED ORDER — ONDANSETRON HCL 4 MG/2ML IJ SOLN: 4.0000 mg | Freq: Four times a day (QID) | INTRAMUSCULAR | Status: DC | PRN

## 2018-07-23 MED ORDER — ACETAMINOPHEN 325 MG PO TABS: 650.0000 mg | ORAL_TABLET | Freq: Four times a day (QID) | ORAL | Status: DC | PRN

## 2018-07-23 MED ORDER — DONEPEZIL HCL 23 MG PO TABS
23.0000 mg | ORAL_TABLET | Freq: Every day | ORAL | Status: DC
  Administered 2018-07-23 – 2018-07-25 (×3): 23 mg via ORAL
  Filled 2018-07-23 (×4): qty 1

## 2018-07-23 MED ORDER — SODIUM CHLORIDE 0.9 % IV SOLN
1.0000 g | Freq: Once | INTRAVENOUS | Status: AC
  Administered 2018-07-23: 18:00:00 1 g via INTRAVENOUS
  Filled 2018-07-23: qty 10

## 2018-07-23 MED ORDER — VITAMIN D (ERGOCALCIFEROL) 1.25 MG (50000 UNIT) PO CAPS
50000.0000 [IU] | ORAL_CAPSULE | ORAL | Status: DC
  Administered 2018-07-24: 50000 [IU] via ORAL
  Filled 2018-07-23: qty 1

## 2018-07-23 MED ORDER — ONDANSETRON HCL 4 MG PO TABS: 4.0000 mg | ORAL_TABLET | Freq: Four times a day (QID) | ORAL | Status: DC | PRN

## 2018-07-23 MED ORDER — DEXTROSE-NACL 5-0.45 % IV SOLN
INTRAVENOUS | Status: AC
  Administered 2018-07-23: 23:00:00 via INTRAVENOUS

## 2018-07-23 MED ORDER — VITAMIN D3 1.25 MG (50000 UT) PO CAPS: 50000.0000 [IU] | ORAL_CAPSULE | ORAL | Status: DC

## 2018-07-23 MED ORDER — KNEE COMPRESSION SLEEVE/S/M MISC: 1.0000 | Freq: Every day | Status: DC

## 2018-07-23 MED ORDER — RIVAROXABAN (XARELTO) VTE STARTER PACK (15 & 20 MG): 15.0000 mg | ORAL_TABLET | ORAL | Status: DC

## 2018-07-23 MED ORDER — SODIUM CHLORIDE 0.9 % IV BOLUS
1000.0000 mL | Freq: Once | INTRAVENOUS | Status: AC
  Administered 2018-07-23: 18:00:00 1000 mL via INTRAVENOUS

## 2018-07-23 MED ORDER — HYDRALAZINE HCL 20 MG/ML IJ SOLN: 5.0000 mg | Freq: Four times a day (QID) | INTRAMUSCULAR | Status: DC | PRN

## 2018-07-23 MED ORDER — SODIUM CHLORIDE 0.9 % IV SOLN
1.0000 g | INTRAVENOUS | Status: DC
  Administered 2018-07-24: 18:00:00 1 g via INTRAVENOUS
  Filled 2018-07-23: qty 10

## 2018-07-23 MED ORDER — SODIUM CHLORIDE 0.9 % IV BOLUS
500.0000 mL | Freq: Once | INTRAVENOUS | Status: AC
  Administered 2018-07-23: 500 mL via INTRAVENOUS

## 2018-07-23 MED ORDER — RIVAROXABAN 15 MG PO TABS
15.0000 mg | ORAL_TABLET | Freq: Two times a day (BID) | ORAL | Status: AC
  Administered 2018-07-23 – 2018-07-24 (×3): 15 mg via ORAL
  Filled 2018-07-23 (×3): qty 1

## 2018-07-23 MED ORDER — RIVAROXABAN 20 MG PO TABS
20.0000 mg | ORAL_TABLET | Freq: Every day | ORAL | Status: DC
  Administered 2018-07-25 – 2018-07-26 (×2): 20 mg via ORAL
  Filled 2018-07-23 (×2): qty 1

## 2018-07-23 MED ORDER — ACETAMINOPHEN 650 MG RE SUPP: 650.0000 mg | Freq: Four times a day (QID) | RECTAL | Status: DC | PRN

## 2018-07-23 NOTE — ED Notes (Signed)
Portable Xray at bedside.

## 2018-07-23 NOTE — ED Notes (Signed)
ED TO INPATIENT HANDOFF REPORT  ED Nurse Name and Phone #: [phone]  S Name/Age/Gender Courtney Day 84 y.o. female Room/Bed: 040C/040C  Code Status   Code Status: Not on file  Home/SNF/Other Home Patient oriented to: self Is this baseline? Yes   Triage Complete: Triage complete  Chief Complaint Behavioral issues  Triage Note Pt brought in by [facility] from home, pt husband requested transport due to patient not gettingout of bed. Pt has no complaints herself. Pt urine malodorous on arrival.    Allergies Allergies  Allergen Reactions  . Ace Inhibitors     REACTION: cough  . Amlodipine Besylate-Valsartan Other (See Comments)    Other Reaction: Other reaction REACTION: dizzy  . Namenda [Memantine Hcl]     ?  . Valsartan     REACTION: dizzy    Level of Care/Admitting Diagnosis ED Disposition    ED Disposition Condition Comment   Admit  Hospital Area: [facility] [[id number]]  Level of Care: Med-Surg [16]  Covid Evaluation: Screening Protocol (No Symptoms)  Diagnosis: Sepsis [facility]) [[id number]]  Admitting Physician: Charlsie Quest [[id number]]  Attending Physician: Charlsie Quest [[id number]]  Estimated length of stay: past midnight tomorrow  Certification:: I certify this patient will need inpatient services for at least 2 midnights  PT Class (Do Not Modify): Inpatient [101]  PT Acc Code (Do Not Modify): Private [1]       B Medical/Surgery History Past Medical History:  Diagnosis Date  . Allergy   . Asthma    > probably all pseudoasthma, -D/C ACE March 30,2010 > pseudowheeze resolved,cough resolved SPN RLL  . Dementia (HCC) 2010   Dr. Terrace Arabia  . Depression   . Hypertension   . Knee pain    Bilateral  . Low back pain   . Osteoarthritis   . Vitiligo 2010   Past Surgical History:  Procedure Laterality Date  . ABDOMINAL HYSTERECTOMY    . hystoplasmosis lung biopsy- 2010 Dr. Wenda Overland       A IV Location/Drains/Wounds Patient Lines/Drains/Airways  Status   Active Line/Drains/Airways    Name:   Placement date:   Placement time:   Site:   Days:   Peripheral IV 07/23/18 Left Antecubital   07/23/18    1616    Antecubital   less than 1          Intake/Output Last 24 hours  Intake/Output Summary (Last 24 hours) at 07/23/2018 1916 Last data filed at 07/23/2018 1831 Gross per 24 hour  Intake 92.34 ml  Output -  Net 92.34 ml    Labs/Imaging Results for orders placed or performed during the hospital encounter of 07/23/18 (from the past 48 hour(s))  Urinalysis, Routine w reflex microscopic     Status: Abnormal   Collection Time: 07/23/18  3:16 PM  Result Value Ref Range   Color, Urine YELLOW YELLOW   APPearance TURBID (A) CLEAR   Specific Gravity, Urine 1.021 1.005 - 1.030   pH 7.0 5.0 - 8.0   Glucose, UA NEGATIVE NEGATIVE mg/dL   Hgb urine dipstick MODERATE (A) NEGATIVE   Bilirubin Urine NEGATIVE NEGATIVE   Ketones, ur NEGATIVE NEGATIVE mg/dL   Protein, ur 865 (A) NEGATIVE mg/dL   Nitrite NEGATIVE NEGATIVE   Leukocytes,Ua MODERATE (A) NEGATIVE   RBC / HPF >50 (H) 0 - 5 RBC/hpf   WBC, UA >50 (H) 0 - 5 WBC/hpf   Bacteria, UA MANY (A) NONE SEEN   WBC Clumps PRESENT  Comment: Performed at [facility], [address], [place], [place] [address]  CBC with Differential     Status: Abnormal   Collection Time: 07/23/18  3:47 PM  Result Value Ref Range   WBC 15.6 (H) 4.0 - 10.5 K/uL   RBC 4.70 3.87 - 5.11 MIL/uL   Hemoglobin 12.2 12.0 - 15.0 g/dL   HCT 00.4 59.9 - 77.4 %   MCV 90.2 80.0 - 100.0 fL   MCH 26.0 26.0 - 34.0 pg   MCHC 28.8 (L) 30.0 - 36.0 g/dL   RDW 14.2 (H) 39.5 - 32.0 %   Platelets 246 150 - 400 K/uL   nRBC 0.0 0.0 - 0.2 %   Neutrophils Relative % 83 %   Neutro Abs 12.9 (H) 1.7 - 7.7 K/uL   Lymphocytes Relative 11 %   Lymphs Abs 1.7 0.7 - 4.0 K/uL   Monocytes Relative 6 %   Monocytes Absolute 1.0 0.1 - 1.0 K/uL   Eosinophils Relative 0 %   Eosinophils Absolute 0.0 0.0 - 0.5 K/uL   Basophils  Relative 0 %   Basophils Absolute 0.0 0.0 - 0.1 K/uL   Immature Granulocytes 0 %   Abs Immature Granulocytes 0.04 0.00 - 0.07 K/uL    Comment: Performed at [facility], [address], [place], [place] [address]  Basic metabolic panel     Status: Abnormal   Collection Time: 07/23/18  3:47 PM  Result Value Ref Range   Sodium 149 (H) 135 - 145 mmol/L   Potassium 4.4 3.5 - 5.1 mmol/L   Chloride 117 (H) 98 - 111 mmol/L   CO2 19 (L) 22 - 32 mmol/L   Glucose, Bld 115 (H) 70 - 99 mg/dL   BUN 35 (H) 8 - 23 mg/dL   Creatinine, Ser 5.68 (H) 0.44 - 1.00 mg/dL   Calcium 9.1 8.9 - 61.6 mg/dL   GFR calc non Af Amer 44 (L) >60 mL/min   GFR calc Af Amer 51 (L) >60 mL/min   Anion gap 13 5 - 15    Comment: Performed at [facility], [address] [place], [place] [address]  Lactic acid, plasma     Status: Abnormal   Collection Time: 07/23/18  4:50 PM  Result Value Ref Range   Lactic Acid, Venous 2.2 (HH) 0.5 - 1.9 mmol/L    Comment: CRITICAL RESULT CALLED TO, READ BACK BY AND VERIFIED WITH: Thayer Headings RN @ 07/23/2018 BY TEMOCHE, H Performed at [facility], [address], [place], [place] [address]    Dg Chest Portable 1 View  Result Date: 07/23/2018 CLINICAL DATA:  Fever EXAM: PORTABLE CHEST 1 VIEW COMPARISON:  07/02/2018 FINDINGS: Heart is borderline in size. Lungs clear. No effusions or acute bony abnormality. IMPRESSION: No active disease. Electronically Signed   By: Charlett Nose M.D.   On: 07/23/2018 17:05    Pending Labs Unresulted Labs (From admission, onward)    Start     Ordered   07/23/18 1641  SARS Coronavirus 2 (CEPHEID - Performed in [facility] hospital lab), Hosp Order  (Asymptomatic Patients Labs)  Once,   R    Question:  Rule Out  Answer:  Yes   07/23/18 1640   07/23/18 1640  Lactic acid, plasma  Now then every 2 hours,   STAT     07/23/18 1639   07/23/18 1640  Culture, blood (routine x 2)  BLOOD CULTURE X 2,   STAT  07/23/18 1639   07/23/18 1505   Urine culture  ONCE - STAT,   STAT     07/23/18 1504          Vitals/Pain Today's Vitals   07/23/18 1502 07/23/18 1521 07/23/18 1757  BP: (!) 130/57  (!) 160/72  Pulse: 77  80  Resp:   14  Temp: 99.5 F (37.5 C) (!) 100.4 F (38 C)   TempSrc: Oral Rectal   SpO2: 98%  100%    Isolation Precautions No active isolations  Medications Medications  cefTRIAXone (ROCEPHIN) 1 g in sodium chloride 0.9 % 100 mL IVPB (0 g Intravenous Stopped 07/23/18 1831)  sodium chloride 0.9 % bolus 1,000 mL (1,000 mLs Intravenous New Bag/Given 07/23/18 1804)    Mobility walks with person assist High fall risk   Focused Assessments    R Recommendations: See Admitting Provider Note  Report given to:   Additional Notes:

## 2018-07-23 NOTE — ED Triage Notes (Signed)
Pt brought in by [facility] from home, pt husband requested transport due to patient not gettingout of bed. Pt has no complaints herself. Pt urine malodorous on arrival.

## 2018-07-23 NOTE — H&P (Signed)
History and Physical    Courtney Day FYB:[id number] DOB: 04/02/1934 DOA: 07/23/2018  PCP: Tresa Garter, MD  Patient coming from: Home  I have personally briefly reviewed patient's old medical records in [facility]  Chief Complaint: Confusion  HPI: Courtney Day is a 84 y.o. female with medical history significant for Alzheimer's dementia, hypertension, DVT on Xarelto who presents to the ED with confusion and failure to thrive.  Patient is unable to provide any history due to dementia, therefore entirety of history is obtained from husband by phone, EDP, and chart review.  Per husband, patient has underlying Alzheimer's dementia with intermittent confusion.  Previously she was able to walk on her own and complete some ADLs.  However over the last month she has had functional decline.  She was found to have a left lower extremity DVT on May 9 and was started on Xarelto.  Since that time she has had worsening confusion and inability to perform any ADLs.  Husband has had a hard time caring for her and has been seeking placement due to her worsening functional status.  Due to continued weakness, worsening lethargy, and an episode of emesis earlier today, he called EMS who brought her to the ED.  ED Course:  Initial vitals showed BP 130/57, pulse 77, RR 14, temp 90.4 Fahrenheit, SPO2 98% on room air.  Labs are notable for WBC 15.6, hemoglobin 12.2, platelets 246,000, sodium 149, potassium 4.4, BUN 35, creatinine 1.16, lactic acid 2.2.  Urinalysis showed moderate leukocytes, negative nitrites, many bacteria, RBCs, WBCs on microscopy.  Blood and urine cultures were obtained and pending.  SARS-CoV-2 test was collected and in process.  Portable chest x-ray was negative for focal consolidation, effusion, or edema.  Patient was given 1 L normal saline and started on IV ceftriaxone and the hospital service was consulted admit for further evaluation and management.  Review of Systems:  Unable  to obtain full review of systems due to underlying dementia.   Past Medical History:  Diagnosis Date   Allergy    Asthma    > probably all pseudoasthma, -D/C ACE March 30,2010 > pseudowheeze resolved,cough resolved SPN RLL   Dementia (HCC) 2010   Dr. Terrace Arabia   Depression    Hypertension    Knee pain    Bilateral   Low back pain    Osteoarthritis    Vitiligo 2010    Past Surgical History:  Procedure Laterality Date   ABDOMINAL HYSTERECTOMY     hystoplasmosis lung biopsy- 2010 Dr. Wenda Overland      Social History:  reports that she has never smoked. She has never used smokeless tobacco. She reports that she does not drink alcohol or use drugs.  Allergies  Allergen Reactions   Ace Inhibitors Cough   Amlodipine Other (See Comments)    dizziness   Amlodipine Besylate-Valsartan Other (See Comments)    dizziness   Namenda [Memantine Hcl] Other (See Comments)    Unknown reaction   Valsartan Other (See Comments)    dizziness    Family History  Problem Relation Age of Onset   Cancer Mother    Cancer Sister        smoker   Multiple sclerosis Son    Hypertension Other      Prior to Admission medications   Medication Sig Start Date End Date Taking? Authorizing Provider  Cholecalciferol (VITAMIN D3) 1.25 MG (50000 UT) CAPS Take 1 capsule by mouth every 30 (thirty) days. 03/07/18  Plotnikov, Georgina Quint, MD  donepezil (ARICEPT) 23 MG TABS tablet Take 1 tablet (23 mg total) by mouth at bedtime. 06/14/17   Micki Riley, MD  Elastic Bandages & Supports (KNEE COMPRESSION SLEEVE/S/M) MISC 1 each by Does not apply route daily. Put on left leg daily and take off at night 07/06/18   Pincus Sanes, MD  Rivaroxaban 15 & 20 MG TBPK Take 15 mg twice daily for first 20 days, then 20 mg once daily with dinner for remaining treatment. Start date 07/03/18  Stop date TBD 07/03/18   [provider]  triamcinolone cream (KENALOG) 0.5 % Apply 1 application topically 3 (three)  times daily. 03/07/18   Plotnikov, Georgina Quint, MD    Physical Exam: Vitals:   07/23/18 1757 07/23/18 2002 07/23/18 2052 07/23/18 2055  BP: (!) 160/72 (!) 169/77 (!) 156/82   Pulse: 80 77 81 78  Resp: Temp:   (!) 100.7 F (38.2 C)   TempSrc:   Oral   SpO2: 100% 100%  93%  Weight:   65.7 kg   Height:    (1.626 m)    Exam limited due to patient's dementia. Constitutional: Elderly woman resting supine in bed, NAD, calm, comfortable Eyes: PERRL, lids and conjunctivae normal ENMT: Mucous membranes are moist. Posterior pharynx clear of any exudate or lesions. Neck: normal, supple, no masses. Respiratory: clear to auscultation anteriorly, no wheezing, no crackles. Normal respiratory effort. No accessory muscle use.  Cardiovascular: Regular rate and rhythm, no murmurs / rubs / gallops. No extremity edema. 2+ pedal pulses. Abdomen: no tenderness, no masses palpated. No hepatosplenomegaly. Bowel sounds positive.  Musculoskeletal: no clubbing / cyanosis. No joint deformity upper and lower extremities. Good ROM, no contractures. Normal muscle tone.  Skin: Hypopigmented patches dorsal aspects of both hands Neurologic: Exam limited due to dementia.  Patient awake and alert but pleasantly demented.  Not following simple commands.  Moving all extremities spontaneously and withdrawing to painful stimuli. Psychiatric:  Alert but pleasantly demented, not oriented to self, place, or time.     Labs on Admission: I have personally reviewed following labs and imaging studies  CBC: Recent Labs  Lab 07/18/18 1725 07/23/18 1547  WBC 8.3 15.6*  NEUTROABS 5.5 12.9*  HGB 10.9* 12.2  HCT 35.4* 42.4  MCV 87.2 90.2  PLT 282 246   Basic Metabolic Panel: Recent Labs  Lab 07/18/18 1942 07/23/18 1547  NA 147* 149*  K 3.7 4.4  CL 115* 117*  CO2 21* 19*  GLUCOSE 96 115*  BUN 30* 35*  CREATININE 0.94 1.16*  CALCIUM 8.9 9.1   GFR: Estimated Creatinine Clearance: 35.5 mL/min (A) (by  C-G formula based on SCr of 1.16 mg/dL (H)). Liver Function Tests: Recent Labs  Lab 07/18/18 1942  AST 21  ALT 17  ALKPHOS 63  BILITOT 0.5  PROT 6.8  ALBUMIN 3.0*   No results for input(s): LIPASE, AMYLASE in the last 168 hours. No results for input(s): AMMONIA in the last 168 hours. Coagulation Profile: No results for input(s): INR, PROTIME in the last 168 hours. Cardiac Enzymes: Recent Labs  Lab 07/18/18 1942  TROPONINI <0.03   BNP (last 3 results) No results for input(s): PROBNP in the last 8760 hours. HbA1C: No results for input(s): HGBA1C in the last 72 hours. CBG: No results for input(s): GLUCAP in the last 168 hours. Lipid Profile: No results for input(s): CHOL, HDL, LDLCALC, TRIG, CHOLHDL, LDLDIRECT in the last 72 hours.  Thyroid Function Tests: No results for input(s): TSH, T4TOTAL, FREET4, T3FREE, THYROIDAB in the last 72 hours. Anemia Panel: No results for input(s): VITAMINB12, FOLATE, FERRITIN, TIBC, IRON, RETICCTPCT in the last 72 hours. Urine analysis:    Component Value Date/Time   COLORURINE YELLOW 07/23/2018 1516   APPEARANCEUR TURBID (A) 07/23/2018 1516   LABSPEC 1.021 07/23/2018 1516   PHURINE 7.0 07/23/2018 1516   GLUCOSEU NEGATIVE 07/23/2018 1516   GLUCOSEU NEGATIVE 04/14/2016 0952   HGBUR MODERATE (A) 07/23/2018 1516   BILIRUBINUR NEGATIVE 07/23/2018 1516   KETONESUR NEGATIVE 07/23/2018 1516   PROTEINUR 100 (A) 07/23/2018 1516   UROBILINOGEN 0.2 04/14/2016 0952   NITRITE NEGATIVE 07/23/2018 1516   LEUKOCYTESUR MODERATE (A) 07/23/2018 1516    Radiological Exams on Admission: Dg Chest Portable 1 View  Result Date: 07/23/2018 CLINICAL DATA:  Fever EXAM: PORTABLE CHEST 1 VIEW COMPARISON:  07/02/2018 FINDINGS: Heart is borderline in size. Lungs clear. No effusions or acute bony abnormality. IMPRESSION: No active disease. Electronically Signed   By: Charlett NoseKevin  Dover M.D.   On: 07/23/2018 17:05    EKG: Not performed.  Assessment/Plan Principal  Problem:   Sepsis secondary to UTI [facility](HCC) Active Problems:   Alzheimer's disease (HCC)   Essential hypertension   DVT (deep venous thrombosis) (HCC)  Courtney Day is a 84 y.o. female with medical history significant for Alzheimer's dementia, hypertension, DVT on Xarelto who is admitted with sepsis due to UTI.  Sepsis secondary to UTI: -Continue IV ceftriaxone -Follow-up blood and urine cultures -Continue IV fluid resuscitation  Mild Uremia/AKI: Likely due to dehydration from poor oral intake. -Continue IV fluid resuscitation and recheck labs in a.m.  Hypernatremia: Likely from hypovolemia. -Continue D5-1/2 NS @ 17300mL/hr overnight -Repeat labs in a.m.  Hypertension: Recently taken off of her home triamterene and HCTZ. -Continue to monitor off antihypertensives, IV hydralazine ordered as needed  LLE DVT: Bilateral extremity ultrasound on 07/02/2018. -Continue Xarelto  Alzheimer's dementia/failure to thrive: Patient with advanced dementia and progressive functional decline.  Husband has been seeking placement due to inability to care for her as an outpatient. -Continue donepezil -Consult to social work, PT/OT -Delirium precautions   DVT prophylaxis: Xarelto Code Status: DNR, confirmed with husband by phone Family Communication: Discussed with husband by phone Disposition Plan: Pending clinical progress Consults called: None Admission status: Inpatient, patient requires greater than 2 midnight stay for management of sepsis due to UTI for IV antibiotics, fluid resuscitation, and culture data.   Darreld McleanVishal Carneshia Raker MD [facility]  If 7PM-7AM, please contact night-coverage [email or web address]  07/23/2018, 9:52 PM

## 2018-07-23 NOTE — Progress Notes (Signed)
CRITICAL VALUE ALERT  Critical Value:  Lactic Acid 2.2  Date & Time Notied:  07/23/18 2119  Provider Notified: Rana Snare, NP  Orders Received/Actions taken: Awaiting orders.  Will continue to monitor patient and inform primary RN.

## 2018-07-23 NOTE — ED Notes (Signed)
Patient's husband at bedside

## 2018-07-23 NOTE — ED Provider Notes (Signed)
[facility] EMERGENCY DEPARTMENT Provider Note   CSN: [id number] Arrival date & time: 07/23/18  1447    History   Chief Complaint Chief Complaint  Patient presents with  . Possible UTI    HPI Courtney Day is a 84 y.o. female with history significant for dementia, htn, dvt on xarelto  brought to the hospital by EMS from home. Husband reports he called EMS this AM because pt has not been acting like her usual self. She is usually able to answer his questions, but over the last few days he noticed she had abnormal responses. Pt went to bed last night immediately after dinner which is unusual for her and she did not wake up until this morning. When he attempts to move her she winces as if she is in pain, this is also new. No medications prior to arrival.  Pt is incontinent and husband noticed malodorous urine.   Pt is unable to contribute to history 2/2 dementia. History provided by patient's spouse with additional history obtained from chart review.     Past Medical History:  Diagnosis Date  . Allergy   . Asthma    > probably all pseudoasthma, -D/C ACE March 30,2010 > pseudowheeze resolved,cough resolved SPN RLL  . Dementia (HCC) 2010   Dr. Terrace Arabia  . Depression   . Hypertension   . Knee pain    Bilateral  . Low back pain   . Osteoarthritis   . Vitiligo 2010    Patient Active Problem List   Diagnosis Date Noted  . Rash and nonspecific skin eruption 07/06/2018  . Acute DVT (deep venous thrombosis) (HCC) 07/05/2018  . Well adult exam 05/18/2013  . Right-sided chest wall pain 09/01/2010  . BREAST PAIN 03/18/2010  . Alzheimer's disease (HCC) 11/26/2009  . Vitiligo 11/26/2009  . PULMONARY NODULE 05/22/2008  . HYPOKALEMIA 05/14/2008  . VERTIGO 05/14/2008  . ALLERGIC RHINITIS 04/27/2008  . Asthma 04/27/2008  . UPPER RESPIRATORY INFECTION (URI) 03/26/2008  . Nonspecific (abnormal) findings on radiological and other examination of body structure 03/26/2008  .  CHEST XRAY, ABNORMAL 03/26/2008  . SINUSITIS, ACUTE 02/28/2008  . Osteoarthritis 07/06/2007  . SHOULDER PAIN 07/06/2007  . LEG PAIN 05/03/2007  . Situational depression 12/24/2006  . Essential hypertension 12/24/2006  . LOW BACK PAIN 12/24/2006    Past Surgical History:  Procedure Laterality Date  . ABDOMINAL HYSTERECTOMY    . hystoplasmosis lung biopsy- 2010 Dr. Wenda Overland       OB History   No obstetric history on file.      Home Medications    Prior to Admission medications   Medication Sig Start Date End Date Taking? Authorizing Provider  Cholecalciferol (VITAMIN D3) 1.25 MG (50000 UT) CAPS Take 1 capsule by mouth every 30 (thirty) days. 03/07/18   Plotnikov, Georgina Quint, MD  donepezil (ARICEPT) 23 MG TABS tablet Take 1 tablet (23 mg total) by mouth at bedtime. 06/14/17   Micki Riley, MD  Elastic Bandages & Supports (KNEE COMPRESSION SLEEVE/S/M) MISC 1 each by Does not apply route daily. Put on left leg daily and take off at night 07/06/18   Pincus Sanes, MD  Rivaroxaban 15 & 20 MG TBPK Take 15 mg twice daily for first 20 days, then 20 mg once daily with dinner for remaining treatment. Start date 07/03/18  Stop date TBD 07/03/18   [provider]  triamcinolone cream (KENALOG) 0.5 % Apply 1 application topically 3 (three) times daily. 03/07/18  Plotnikov, Georgina Quint, MD    Family History Family History  Problem Relation Age of Onset  . Cancer Mother   . Cancer Sister        smoker  . Multiple sclerosis Son   . Hypertension Other     Social History Social History   Tobacco Use  . Smoking status: Never Smoker  . Smokeless tobacco: Never Used  Substance Use Topics  . Alcohol use: No  . Drug use: No     Allergies   Ace inhibitors; Amlodipine besylate-valsartan; Namenda [memantine hcl]; and Valsartan   Review of Systems Review of Systems  Unable to perform ROS: Dementia     Physical Exam Updated Vital Signs BP (!) 130/57 (BP Location: Right Arm)    Pulse 77   Temp (!) 100.4 F (38 C) (Rectal)   SpO2 98%   Physical Exam Vitals signs and nursing note reviewed.  Constitutional:      General: She is not in acute distress.    Appearance: She is not ill-appearing.  HENT:     Head: Normocephalic and atraumatic.     Right Ear: Tympanic membrane and external ear normal.     Left Ear: Tympanic membrane and external ear normal.     Nose: Nose normal.     Mouth/Throat:     Mouth: Mucous membranes are dry.     Pharynx: Oropharynx is clear.  Eyes:     General: No scleral icterus.       Right eye: No discharge.        Left eye: No discharge.     Extraocular Movements: Extraocular movements intact.     Conjunctiva/sclera: Conjunctivae normal.     Pupils: Pupils are equal, round, and reactive to light.  Neck:     Musculoskeletal: Normal range of motion.     Vascular: No JVD.  Cardiovascular:     Rate and Rhythm: Normal rate and regular rhythm.     Pulses: Normal pulses.          Radial pulses are 2+ on the right side and 2+ on the left side.       Dorsalis pedis pulses are 2+ on the right side and 2+ on the left side.     Heart sounds: Normal heart sounds.  Pulmonary:     Comments: Lungs clear to auscultation in all fields. Symmetric chest rise. No wheezing, rales, or rhonchi. Abdominal:     Tenderness: There is no right CVA tenderness or left CVA tenderness.     Comments: Abdomen is soft, non-distended, and non-tender in all quadrants. No rigidity, no guarding. No peritoneal signs.  Musculoskeletal: Normal range of motion.     Right lower leg: No edema.     Left lower leg: No edema.  Skin:    General: Skin is warm and dry.     Capillary Refill: Capillary refill takes less than 2 seconds.  Neurological:     Comments: Fluent speech, no facial droop. Patient awake and alert but pleasantly demented.  She does not follow simple commands.  Moving all extremities spontaneously and withdrawing to painful stimuli.  Psychiatric:         Speech: Speech normal.      ED Treatments / Results  Labs (all labs ordered are listed, but only abnormal results are displayed) Labs Reviewed  CBC WITH DIFFERENTIAL/PLATELET - Abnormal; Notable for the following components:      Result Value   WBC 15.6 (*)    MCHC  28.8 (*)    RDW 16.1 (*)    Neutro Abs 12.9 (*)    All other components within normal limits  BASIC METABOLIC PANEL - Abnormal; Notable for the following components:   Sodium 149 (*)    Chloride 117 (*)    CO2 19 (*)    Glucose, Bld 115 (*)    BUN 35 (*)    Creatinine, Ser 1.16 (*)    GFR calc non Af Amer 44 (*)    GFR calc Af Amer 51 (*)    All other components within normal limits  URINALYSIS, ROUTINE W REFLEX MICROSCOPIC - Abnormal; Notable for the following components:   APPearance TURBID (*)    Hgb urine dipstick MODERATE (*)    Protein, ur 100 (*)    Leukocytes,Ua MODERATE (*)    RBC / HPF >50 (*)    WBC, UA >50 (*)    Bacteria, UA MANY (*)    All other components within normal limits  LACTIC ACID, PLASMA - Abnormal; Notable for the following components:   Lactic Acid, Venous 2.2 (*)    All other components within normal limits  URINE CULTURE  CULTURE, BLOOD (ROUTINE X 2)  CULTURE, BLOOD (ROUTINE X 2)  SARS CORONAVIRUS 2 (HOSPITAL ORDER, PERFORMED IN [facility])  LACTIC ACID, PLASMA    EKG None  Radiology Dg Chest Portable 1 View  Result Date: 07/23/2018 CLINICAL DATA:  Fever EXAM: PORTABLE CHEST 1 VIEW COMPARISON:  07/02/2018 FINDINGS: Heart is borderline in size. Lungs clear. No effusions or acute bony abnormality. IMPRESSION: No active disease. Electronically Signed   By: Charlett NoseKevin  Dover M.D.   On: 07/23/2018 17:05    Procedures .Critical Care Performed by: Sherene SiresAlbrizze,  E, PA-C Authorized by: Sherene SiresAlbrizze,  E, PA-C   Critical care provider statement:    Critical care time (minutes):  37   Critical care time was exclusive of:  Separately billable procedures and  treating other patients and teaching time   Critical care was necessary to treat or prevent imminent or life-threatening deterioration of the following conditions:  Sepsis   Critical care was time spent personally by me on the following activities:  Discussions with consultants, evaluation of patient's response to treatment, examination of patient, ordering and performing treatments and interventions, ordering and review of laboratory studies, ordering and review of radiographic studies, pulse oximetry, re-evaluation of patient's condition, obtaining history from patient or surrogate and review of old charts   (including critical care time)  Medications Ordered in ED Medications  cefTRIAXone (ROCEPHIN) 1 g in sodium chloride 0.9 % 100 mL IVPB (1 g Intravenous New Bag/Given 07/23/18 1801)  sodium chloride 0.9 % bolus 1,000 mL (1,000 mLs Intravenous New Bag/Given 07/23/18 1804)     Initial Impression / Assessment and Plan / ED Course  I have reviewed the triage vital signs and the nursing notes.  Pertinent labs & imaging results that were available during my care of the patient were reviewed by me and considered in my medical decision making (see chart for details).  Pt is febrile to 100.4 rectal temp. She is overall well appearing and in no acute distress. Blood pressure is soft at 130/57, she is not hypoxic or tachycardic. On exam she has no complaints and does not wince or grimace to suggest pain with palpation from head to toe. Labs and vitals meet sepsis criteria. She has leukocytosis of 15.6. Urine is cloudy and UA shows infection, culture sent. UTI as likely source.  IV team is attempting to draw lactic acid and cultures. IVF started. There was delay in obtaining lactic acid as pt is difficult stick, lactic acid result is 2.2. Blood cultures also collected. BMP shows worsening renal function, her BUN/creatinine today is 35/1.16, compared to 5 days ago when it was 30/0.94. Pt started on IV  rocephin. This case was discussed with Dr. Rubin Payor who agrees with plan to admit. Covid test pending.  Spoke with Dr. Allena Katz with hospitalist service who agrees to assume care of patient and bring into the hospital for further evaluation and management.     Final Clinical Impressions(s) / ED Diagnoses   Final diagnoses:  Sepsis, due to unspecified organism, unspecified whether acute organ dysfunction present [facility])  AKI (acute kidney injury) [facility])    ED Discharge Orders    None       Kathyrn Lass 07/23/18 2139    Benjiman Core, MD 07/26/18 1513

## 2018-07-24 DIAGNOSIS — F0280 Dementia in other diseases classified elsewhere without behavioral disturbance: Secondary | ICD-10-CM

## 2018-07-24 DIAGNOSIS — I82512 Chronic embolism and thrombosis of left femoral vein: Secondary | ICD-10-CM

## 2018-07-24 DIAGNOSIS — G301 Alzheimer's disease with late onset: Secondary | ICD-10-CM

## 2018-07-24 DIAGNOSIS — N179 Acute kidney failure, unspecified: Secondary | ICD-10-CM

## 2018-07-24 DIAGNOSIS — I10 Essential (primary) hypertension: Secondary | ICD-10-CM

## 2018-07-24 LAB — BASIC METABOLIC PANEL
Anion gap: 9 (ref 5–15)
BUN: 29 mg/dL — ABNORMAL HIGH (ref 8–23)
CO2: 25 mmol/L (ref 22–32)
Calcium: 8.4 mg/dL — ABNORMAL LOW (ref 8.9–10.3)
Chloride: 113 mmol/L — ABNORMAL HIGH (ref 98–111)
Creatinine, Ser: 0.89 mg/dL (ref 0.44–1.00)
GFR calc Af Amer: >60 mL/min (ref >60)
GFR calc non Af Amer: >60 mL/min (ref >60)
Glucose, Bld: 124 mg/dL — ABNORMAL HIGH (ref 70–99)
Potassium: 4.1 mmol/L (ref 3.5–5.1)
Sodium: 147 mmol/L — ABNORMAL HIGH (ref 135–145)

## 2018-07-24 LAB — CBC
HCT: 33.6 % — ABNORMAL LOW (ref 36.0–46.0)
Hemoglobin: 10.1 g/dL — ABNORMAL LOW (ref 12.0–15.0)
MCH: 26 pg (ref 26.0–34.0)
MCHC: 30.1 g/dL (ref 30.0–36.0)
MCV: 86.4 fL (ref 80.0–100.0)
Platelets: 229 10*3/uL (ref 150–400)
RBC: 3.89 MIL/uL (ref 3.87–5.11)
RDW: 15.8 % — ABNORMAL HIGH (ref 11.5–15.5)
WBC: 11.4 10*3/uL — ABNORMAL HIGH (ref 4.0–10.5)
nRBC: 0 % (ref 0.0–0.2)

## 2018-07-24 LAB — LACTIC ACID, PLASMA
Lactic Acid, Venous: 2.2 mmol/L (ref 0.5–1.9)
Lactic Acid, Venous: 1.2 mmol/L (ref 0.5–1.9)

## 2018-07-24 NOTE — Evaluation (Signed)
Occupational Therapy Evaluation Patient Details Name: Courtney Day MRN: [id number] DOB: 01/06/1935 Today's Date: 07/24/2018    History of Present Illness 84 y.o. female with medical history significant for Alzheimer's dementia, hypertension, DVT on Xarelto who presents to the ED with confusion and failure to thrive.   Clinical Impression   Patient admitted for above, limited by problem list below including cognition, weakness and poor activity tolerance.  Patient's spouse reports she has declined recently and required increased assistance with feeding and mobility.  Patient supine in bed, lethargic but opens eyes and verbalizes "yes/no" throughout session. Disoriented, but responds to her name.  Total assist for self care at this time, attempted grooming with total assist and feeding with pt verbalizing want for food but then doesn't open her mouth.  Based on performance today, patient will benefit from continued OT services while admitted in order to address self feeding, recommend SNF at dc with memory care unit. Will follow acutely.     Follow Up Recommendations  SNF;Supervision/Assistance - 24 hour(memory care unit?)    Equipment Recommendations  None recommended by OT    Recommendations for Other Services       Precautions / Restrictions Precautions Precautions: Fall Precaution Comments: advanced dementia  Restrictions Weight Bearing Restrictions: No      Mobility Bed Mobility Overal bed mobility: Needs Assistance             General bed mobility comments: supine in bed, pt not following commands due to confusion  Transfers                 General transfer comment: deferred    Balance                                           ADL either performed or assessed with clinical judgement   ADL Overall ADL's : Needs assistance/impaired                                       General ADL Comments: total assist for all self care  at this time      Vision   Additional Comments: opens eyes and able to scan to follow therapist around room but unable to assess further due to cognition      Perception     Praxis      Pertinent Vitals/Pain Pain Assessment: No/denies pain     Hand Dominance Right   Extremity/Trunk Assessment Upper Extremity Assessment Upper Extremity Assessment: Generalized weakness;Difficult to assess due to impaired cognition   Lower Extremity Assessment Lower Extremity Assessment: Defer to PT evaluation       Communication Communication Communication: Other (comment)(limited verbaliziations )   Cognition Arousal/Alertness: Lethargic Behavior During Therapy: Flat affect Overall Cognitive Status: History of cognitive impairments - at baseline                                 General Comments: pt able to verbalize "yes" to her name otherwise disoriented, states yes/no to questions asked but inconsistent, dementia at baseline and spouse reports cognition has declined recently    General Comments       Exercises     Shoulder Instructions      Home Living Family/patient  expects to be discharged to:: Skilled nursing facility                                        Prior Functioning/Environment Level of Independence: Needs assistance  Gait / Transfers Assistance Needed: unable lately  ADL's / Homemaking Assistance Needed: assist required for bathing/dressing/grooming/toileting; spouse reports able to feed herself with increased time up until the past week and since then has required max-total assist            OT Problem List: Decreased strength;Decreased activity tolerance;Decreased cognition;Impaired balance (sitting and/or standing);Decreased coordination      OT Treatment/Interventions: Self-care/ADL training;Therapeutic exercise    OT Goals(Current goals can be found in the care plan section) Acute Rehab OT Goals Patient Stated Goal: per  spouse, "to have her go to a facility"  OT Goal Formulation: With family Time For Goal Achievement: 08/07/18 Potential to Achieve Goals: Good  OT Frequency: Min 2X/week   Barriers to D/C:            Co-evaluation              AM-PAC OT "6 Clicks" Daily Activity     Outcome Measure Help from another person eating meals?: Total Help from another person taking care of personal grooming?: Total Help from another person toileting, which includes using toliet, bedpan, or urinal?: Total Help from another person bathing (including washing, rinsing, drying)?: Total Help from another person to put on and taking off regular upper body clothing?: Total Help from another person to put on and taking off regular lower body clothing?: Total 6 Click Score: 6   End of Session Nurse Communication: Mobility status  Activity Tolerance: Patient limited by lethargy;Other (comment)(confusion) Patient left: in bed;with bed alarm set;with call bell/phone within reach  OT Visit Diagnosis: Feeding difficulties (R63.3);Muscle weakness (generalized) (M62.81)                Time: [id number] OT Time Calculation (min): 11 min Charges:  OT General Charges $OT Visit: 1 Visit OT Evaluation $OT Eval Moderate Complexity: 1 Mod  Chancy Milroyhristie S Verner Kopischke, OT Acute Rehabilitation Services Pager [phone] Office [phone]   Chancy MilroyChristie S Kisean Rollo 07/24/2018, 3:21 PM

## 2018-07-24 NOTE — Progress Notes (Signed)
PROGRESS NOTE    Courtney Day  ELT:[id number] DOB: May 07, 1934 DOA: 07/23/2018 PCP: Cassandria Anger, MD     Brief Narrative:  84 y.o. female with medical history significant for Alzheimer's dementia, hypertension, DVT on Xarelto who presents to the ED with confusion and failure to thrive.  Patient is unable to provide any history due to dementia, therefore entirety of history is obtained from husband by phone, EDP, and chart review.  Per husband, patient has underlying Alzheimer's dementia with intermittent confusion.  Previously she was able to walk on her own and complete some ADLs.  However over the last month she has had functional decline.  She was found to have a left lower extremity DVT on May 9 and was started on Xarelto.  Since that time she has had worsening confusion and inability to perform any ADLs.  Husband has had a hard time caring for her and has been seeking placement due to her worsening functional status.  Due to continued weakness, worsening lethargy, and an episode of emesis earlier today, he called EMS who brought her to the ED.   Assessment & Plan: 1-Sepsis secondary to UTI [facility]) -Patient met sepsis criteria at the time of admission (with elevated heart rate elevated WBCs, elevated temperature, worsening renal function and worsening mentation; Urinalysis suggesting UTI. -Continue IV fluids -Continue daily dressing regimen -Follow culture results   2-Alzheimer's disease ([place]) -Continue donepezil -continue supportive care  3-Essential hypertension -continue PRN hydralazine -continue holding HCTZ  4-DVT (deep venous thrombosis) (HCC) -continue Xarelto  5-physical deconditioning  -will follow rec's by PT/OT -patient now having difficulties performing ADL's  -SW on board and will follow rec's for safe discharge plans.   6-dehydration/hypernatremia -continue IVF's -encourage oral intake.  7-AKI -pre-renal in nature from dehydration; continue use of  diuretics and concerns for UTI also contributing. -continue IVF's -continue abx's -follow renal function trend   DVT prophylaxis: xarelto  Code Status: DNR/DNI Family Communication: no family at bedside Disposition Plan: continue IVF's and IV antibiotics, follow cultures results and clinical response.   Consultants:   None   Procedures:   See below for x-ray reports   Antimicrobials:  Anti-infectives (From admission, onward)   Start     Dose/Rate Route Frequency Ordered Stop   07/24/18 1800  cefTRIAXone (ROCEPHIN) 1 g in sodium chloride 0.9 % 100 mL IVPB     1 g 200 mL/hr over 30 Minutes Intravenous Every 24 hours 07/23/18 2007     07/23/18 1645  cefTRIAXone (ROCEPHIN) 1 g in sodium chloride 0.9 % 100 mL IVPB     1 g 200 mL/hr over 30 Minutes Intravenous  Once 07/23/18 1644 07/23/18 1831       Subjective: Afebrile, no CP, no SOB. Pleasantly confused. In no acute distress currently.  Objective: Vitals:   07/23/18 2301 07/23/18 2337 07/23/18 2344 07/24/18 0515  BP: (!) 161/119 97/77 (!) 165/58 (!) 144/52  Pulse: 73 71 70 61  Resp: _0 Temp: 99.4 F (37.4 C) 99 F (37.2 C)  98.6 F (37 C)  TempSrc: Oral Axillary  Oral  SpO2: (!) 77% 93%  99%  Weight:      Height:        Intake/Output Summary (Last 24 hours) at 07/24/2018 0831 Last data filed at 07/23/2018 1831 Gross per 24 hour  Intake 92.34 ml  Output -  Net 92.34 ml   Filed Weights   07/23/18 2052  Weight: 65.7 kg    Examination:  General exam: Alert, awake, oriented x 1; having difficulty following simple commands.  Demonstrating poor insight.  No chest pain, no shortness of breath, no vomiting currently. Respiratory system: Clear to auscultation. Respiratory effort normal. Cardiovascular system:RRR. No murmurs, rubs, gallops. Gastrointestinal system: Abdomen is nondistended, soft and nontender. No organomegaly or masses felt. Normal bowel sounds heard. Central nervous system: Alert and oriented.  No focal neurological deficits. Extremities: No C/C/E, +pedal pulses; good range of motion and no joint swelling appreciated. Skin: No open wounds; no petechiae. Psychiatry: Mood & affect appropriate.   Data Reviewed: I have personally reviewed following labs and imaging studies  CBC: Recent Labs  Lab 07/18/18 1725 07/23/18 1547 07/24/18 0731  WBC 8.3 15.6* 11.4*  NEUTROABS 5.5 12.9*  --   HGB 10.9* 12.2 10.1*  HCT 35.4* 42.4 33.6*  MCV 87.2 90.2 86.4  PLT 282 246 951   Basic Metabolic Panel: Recent Labs  Lab 07/18/18 1942 07/23/18 1547 07/24/18 0731  NA 147* 149* 147*  K 3.7 4.4 4.1  CL 115* 117* 113*  CO2 21* 19* 25  GLUCOSE 96 115* 124*  BUN 30* 35* 29*  CREATININE 0.94 1.16* 0.89  CALCIUM 8.9 9.1 8.4*   GFR: Estimated Creatinine Clearance: 46.3 mL/min (by C-G formula based on SCr of 0.89 mg/dL).   Liver Function Tests: Recent Labs  Lab 07/18/18 1942  AST 21  ALT 17  ALKPHOS 63  BILITOT 0.5  PROT 6.8  ALBUMIN 3.0*    Cardiac Enzymes: Recent Labs  Lab 07/18/18 1942  TROPONINI <0.03   Urine analysis:    Component Value Date/Time   COLORURINE YELLOW 07/23/2018 1516   APPEARANCEUR TURBID (A) 07/23/2018 1516   LABSPEC 1.021 07/23/2018 1516   PHURINE 7.0 07/23/2018 1516   GLUCOSEU NEGATIVE 07/23/2018 1516   GLUCOSEU NEGATIVE 04/14/2016 0952   HGBUR MODERATE (A) 07/23/2018 1516   BILIRUBINUR NEGATIVE 07/23/2018 1516   KETONESUR NEGATIVE 07/23/2018 1516   PROTEINUR 100 (A) 07/23/2018 1516   UROBILINOGEN 0.2 04/14/2016 0952   NITRITE NEGATIVE 07/23/2018 1516   LEUKOCYTESUR MODERATE (A) 07/23/2018 1516    Recent Results (from the past 240 hour(s))  SARS Coronavirus 2 (CEPHEID - Performed in [place] hospital lab), Hosp Order     Status: None   Collection Time: 07/23/18  6:09 PM  Result Value Ref Range Status   SARS Coronavirus 2 NEGATIVE NEGATIVE Final    Comment: (NOTE) If result is NEGATIVE SARS-CoV-2 target nucleic acids are NOT  DETECTED. The SARS-CoV-2 RNA is generally detectable in upper and lower  respiratory specimens during the acute phase of infection. The lowest  concentration of SARS-CoV-2 viral copies this assay can detect is 250  copies / mL. A negative result does not preclude SARS-CoV-2 infection  and should not be used as the sole basis for treatment or other  patient management decisions.  A negative result may occur with  improper specimen collection / handling, submission of specimen other  than nasopharyngeal swab, presence of viral mutation(s) within the  areas targeted by this assay, and inadequate number of viral copies  (<250 copies / mL). A negative result must be combined with clinical  observations, patient history, and epidemiological information. If result is POSITIVE SARS-CoV-2 target nucleic acids are DETECTED. The SARS-CoV-2 RNA is generally detectable in upper and lower  respiratory specimens dur ing the acute phase of infection.  Positive  results are indicative of active infection with SARS-CoV-2.  Clinical  correlation with patient history and other diagnostic  information is  necessary to determine patient infection status.  Positive results do  not rule out bacterial infection or co-infection with other viruses. If result is PRESUMPTIVE POSTIVE SARS-CoV-2 nucleic acids MAY BE PRESENT.   A presumptive positive result was obtained on the submitted specimen  and confirmed on repeat testing.  While 2019 novel coronavirus  (SARS-CoV-2) nucleic acids may be present in the submitted sample  additional confirmatory testing may be necessary for epidemiological  and / or clinical management purposes  to differentiate between  SARS-CoV-2 and other Sarbecovirus currently known to infect humans.  If clinically indicated additional testing with an alternate test  methodology [phone]) is advised. The SARS-CoV-2 RNA is generally  detectable in upper and lower respiratory sp ecimens during  the acute  phase of infection. The expected result is Negative. Fact Sheet for Patients:  StrictlyIdeas.no Fact Sheet for Healthcare Providers: BankingDealers.co.za This test is not yet approved or cleared by the [place] FDA and has been authorized for detection and/or diagnosis of SARS-CoV-2 by FDA under an Emergency Use Authorization (EUA).  This EUA will remain in effect (meaning this test can be used) for the duration of the COVID-19 declaration under Section 564(b)(1) of the Act, 21 U.S.C. section 360bbb-3(b)(1), unless the authorization is terminated or revoked sooner. Performed at [facility], [place] [address], [place], [place] [address]      Radiology Studies: Dg Chest Portable 1 View  Result Date: 07/23/2018 CLINICAL DATA:  Fever EXAM: PORTABLE CHEST 1 VIEW COMPARISON:  07/02/2018 FINDINGS: Heart is borderline in size. Lungs clear. No effusions or acute bony abnormality. IMPRESSION: No active disease. Electronically Signed   By: Rolm Baptise M.D.   On: 07/23/2018 17:05    Scheduled Meds: . donepezil  23 mg Oral QHS  . rivaroxaban  15 mg Oral BID WC   Followed by  . [START ON 07/25/2018] rivaroxaban  20 mg Oral Q supper  . Vitamin D (Ergocalciferol)  50,000 Units Oral Q30 days   Continuous Infusions: . cefTRIAXone (ROCEPHIN)  IV       LOS: 1 day    Time spent: 30 minutes    Barton Dubois, MD [facility] Pager [phone]  07/24/2018, 8:31 AM

## 2018-07-24 NOTE — Plan of Care (Signed)
  Problem: Health Behavior/Discharge Planning: Goal: Ability to manage health-related needs will improve Outcome: Progressing   

## 2018-07-24 NOTE — TOC Initial Note (Signed)
Transition of Care [facility](TOC) - Initial/Assessment Note    Patient Details  Name: Courtney Day MRN: [id number] Date of Birth: 05-28-1934  Transition of Care [facility](TOC) CM/SW Contact:    Maree KrabbeBridget A Calyssa Zobrist, LCSW Phone Number: 07/24/2018, 10:00 AM  Clinical Narrative:  CSW was consulted for placement because pt has Alzheimer's dementia therefore pt has functionally declined. CSW spoke with pt's spouse via telephone.  Husband confirmed he was seeking placement for pt due to inability to care for her at home. CSW received pt's spouse verbal permission to make a referral to Jeanann LewandowskySheena Bryant with "[facility]" to determine to most adequate placement for pt  going forward. Pt's spouse gave CSW verbal permission to give Sheena his cell phone number to reach him. CSW unable to reach [place] on Sunday but left message on her confidential voicemail. Handoff left for CSW to follow up with Sheena on Monday.  Expected Discharge Plan: Memory Care(Undetermined at this time) Barriers to Discharge: Continued Medical Work up   Patient Goals and CMS Choice Patient states their goals for this hospitalization and ongoing recovery are:: Per pt's spouse "to get pt somewhere"   Choice offered to / list presented to : Spouse  Expected Discharge Plan and Services Expected Discharge Plan: Memory Care(Undetermined at this time) In-house Referral: Clinical Social Work   Post Acute Care Choice: (Undetermined at this time) Living arrangements for the past 2 months: Single Family Home                 DME Arranged: N/A         HH Arranged: NA          Prior Living Arrangements/Services Living arrangements for the past 2 months: Single Family Home Lives with:: Spouse Patient language and need for interpreter reviewed:: Yes Do you feel safe going back to the place where you live?: Yes      Need for Family Participation in Patient Care: Yes (Comment) Care giver support system in place?: Yes (comment)(Spouse)   Criminal  Activity/Legal Involvement Pertinent to Current Situation/Hospitalization: No - Comment as needed  Activities of Daily Living      Permission Sought/Granted Permission sought to share information with : Facility Medical sales representativeContact Representative, Family Supports    Share Information with NAME: [name]  Permission granted to share info w AGENCY: [facility]  Permission granted to share info w Relationship: Spouse  Permission granted to share info w Contact Information: [phone]  Emotional Assessment Appearance:: Appears stated age Attitude/Demeanor/Rapport: Unable to Assess Affect (typically observed): Unable to Assess Orientation: : Oriented to Self Alcohol / Substance Use: Not Applicable Psych Involvement: No (comment)  Admission diagnosis:  AKI (acute kidney injury) (HCC) [N17.9] Sepsis, due to unspecified organism, unspecified whether acute organ dysfunction present [facility](HCC) [A41.9] Patient Active Problem List   Diagnosis Date Noted  . Sepsis secondary to UTI (HCC) 07/23/2018  . Rash and nonspecific skin eruption 07/06/2018  . DVT (deep venous thrombosis) (HCC) 07/05/2018  . Well adult exam 05/18/2013  . Right-sided chest wall pain 09/01/2010  . BREAST PAIN 03/18/2010  . Alzheimer's disease (HCC) 11/26/2009  . Vitiligo 11/26/2009  . PULMONARY NODULE 05/22/2008  . HYPOKALEMIA 05/14/2008  . VERTIGO 05/14/2008  . ALLERGIC RHINITIS 04/27/2008  . Asthma 04/27/2008  . UPPER RESPIRATORY INFECTION (URI) 03/26/2008  . Nonspecific (abnormal) findings on radiological and other examination of body structure 03/26/2008  . CHEST XRAY, ABNORMAL 03/26/2008  . SINUSITIS, ACUTE 02/28/2008  . Osteoarthritis 07/06/2007  . SHOULDER  PAIN 07/06/2007  . LEG PAIN 05/03/2007  . Situational depression 12/24/2006  . Essential hypertension 12/24/2006  . LOW BACK PAIN 12/24/2006   PCP:  Tresa Garter, MD Pharmacy:   [facility] [address] [place] - [address] [place] [address] Phone: [phone] Fax: [phone]  [facility] - [place], [place] - [address] [place] [address] Phone: [phone] Fax: [phone]     Social Determinants of Health (SDOH) Interventions    Readmission Risk Interventions No flowsheet data found.

## 2018-07-24 NOTE — Evaluation (Signed)
Physical Therapy Evaluation Patient Details Name: Courtney Day MRN: [id number] DOB: 08-31-1934 Today's Date: 07/24/2018   History of Present Illness  84 y.o. female with medical history significant for Alzheimer's dementia, hypertension, DVT on Xarelto who presents to the ED with confusion and failure to thrive.    Clinical Impression  Pt admitted with above diagnosis. Pt currently with functional limitations due to the deficits listed below (see PT Problem List). History obtained from husband via phone call, pt from home with him receiving assistance for ADLs and non ambulatory for weeks but will transfer to and from w/c or take 1-3 steps with husbands assistance. Husband describes worsening cognition over last several days prior to admission. Today, unable to follow any cues, unable to sit or try to stand due to cognition.  Pt will benefit from skilled PT to increase their independence and safety with mobility to allow discharge to the venue listed below.       Follow Up Recommendations Other (comment)(Memory Care facility or SNF)    Equipment Recommendations  None recommended by PT    Recommendations for Other Services       Precautions / Restrictions Precautions Precautions: Fall Precaution Comments: advanced dementia  Restrictions Weight Bearing Restrictions: No      Mobility  Bed Mobility Overal bed mobility: Needs Assistance             General bed mobility comments: limited mobility due to confusion   Transfers                    Ambulation/Gait                Stairs            Wheelchair Mobility    Modified Rankin (Stroke Patients Only)       Balance                                             Pertinent Vitals/Pain Pain Assessment: No/denies pain    Home Living Family/patient expects to be discharged to:: Skilled nursing facility Living Arrangements: Spouse/significant other                     Prior Function Level of Independence: Needs assistance   Gait / Transfers Assistance Needed: unable lately   ADL's / Homemaking Assistance Needed: husband assisting her with all        Hand Dominance        Extremity/Trunk Assessment   Upper Extremity Assessment Upper Extremity Assessment: Difficult to assess due to impaired cognition    Lower Extremity Assessment Lower Extremity Assessment: Difficult to assess due to impaired cognition       Communication      Cognition     Overall Cognitive Status: History of cognitive impairments - at baseline                                 General Comments: pt unable to follow cues, advanced demtnia but husband reports her congition has worsned lately.       General Comments      Exercises     Assessment/Plan    PT Assessment Patient needs continued PT services  PT Problem List Decreased strength       PT Treatment Interventions  DME instruction    PT Goals (Current goals can be found in the Care Plan section)  Acute Rehab PT Goals Patient Stated Goal: non stated- husband wants her to go to facililty  PT Goal Formulation: With family Potential to Achieve Goals: Fair    Frequency Min 1X/week(trial for improved congition once UTI resolves )   Barriers to discharge Decreased caregiver support      Co-evaluation               AM-PAC PT "6 Clicks" Mobility  Outcome Measure Help needed turning from your back to your side while in a flat bed without using bedrails?: Total Help needed moving from lying on your back to sitting on the side of a flat bed without using bedrails?: Total Help needed moving to and from a bed to a chair (including a wheelchair)?: Total Help needed standing up from a chair using your arms (e.g., wheelchair or bedside chair)?: Total Help needed to walk in hospital room?: Total Help needed climbing 3-5 steps with a railing? : Total 6 Click Score: 6    End of Session    Activity Tolerance: Other (comment)(confusion ) Patient left: in bed;with bed alarm set Nurse Communication: Mobility status PT Visit Diagnosis: Unsteadiness on feet (R26.81)    Time: 1140-1200 PT Time Calculation (min) (ACUTE ONLY): 20 min   Charges:   PT Evaluation $PT Eval Moderate Complexity: 1 Mod         Etta Grandchild, PT, DPT Acute Rehabilitation Services Pager: [phone] Office: [phone]    Etta Grandchild 07/24/2018, 12:27 PM

## 2018-07-25 LAB — URINE CULTURE: Culture: >=100000 — AB

## 2018-07-25 MED ORDER — CEPHALEXIN 500 MG PO CAPS
500.0000 mg | ORAL_CAPSULE | Freq: Two times a day (BID) | ORAL | Status: DC
  Administered 2018-07-25 – 2018-07-26 (×3): 500 mg via ORAL
  Filled 2018-07-25 (×4): qty 1

## 2018-07-25 MED ORDER — DEXTROSE 5 % IV SOLN
INTRAVENOUS | Status: AC
  Administered 2018-07-25 – 2018-07-26 (×2): via INTRAVENOUS

## 2018-07-25 NOTE — Progress Notes (Signed)
PROGRESS NOTE    Courtney Day  OBS:[id number] DOB: May 04, 1934 DOA: 07/23/2018 PCP: Cassandria Anger, MD     Brief Narrative:  84 y.o. female with medical history significant for Alzheimer's dementia, hypertension, DVT on Xarelto who presents to the ED with confusion and failure to thrive.  Patient is unable to provide any history due to dementia, therefore entirety of history is obtained from husband by phone, EDP, and chart review.  Per husband, patient has underlying Alzheimer's dementia with intermittent confusion.  Previously she was able to walk on her own and complete some ADLs.  However over the last month she has had functional decline.  She was found to have a left lower extremity DVT on May 9 and was started on Xarelto.  Since that time she has had worsening confusion and inability to perform any ADLs.  Husband has had a hard time caring for her and has been seeking placement due to her worsening functional status.  Due to continued weakness, worsening lethargy, and an episode of emesis earlier today, he called EMS who brought her to the ED.   Assessment & Plan: 1-Sepsis secondary to UTI [facility]): Pansensitive E. coli -Patient met sepsis criteria at the time of admission (with elevated heart rate elevated WBCs, elevated temperature, worsening renal function and worsening mentation; Urinalysis suggesting UTI. -Continue IV fluids, will adjust rate. -Following sensitivity will transition antibiotics to oral regimen.  2-Alzheimer's disease ([place]) -Continue donepezil -continue supportive care  3-Essential hypertension -continue PRN hydralazine -continue holding HCTZ -Blood pressure stable.  4-DVT (deep venous thrombosis) ([place]) -continue Xarelto  5-physical deconditioning  -will follow rec's by PT/OT -patient now having difficulties performing ADL's  -SW on board and will follow rec's for safe discharge plans.   6-dehydration/hypernatremia -continue IVF's -encourage oral  intake.  7-AKI -pre-renal in nature from dehydration; continue use of diuretics and concerns for UTI also contributing. -continue IVF's, but adjust rate -continue abx's, but will transition to PO. -follow renal function trend   DVT prophylaxis: xarelto  Code Status: DNR/DNI Family Communication: no family at bedside Disposition Plan: continue IVF's and transition antibiotics to oral regimen and assess for adequate capacity to tolerate by mouth treatment.  Consultants:   None   Procedures:   See below for x-ray reports   Antimicrobials:  Anti-infectives (From admission, onward)   Start     Dose/Rate Route Frequency Ordered Stop   07/24/18 1800  cefTRIAXone (ROCEPHIN) 1 g in sodium chloride 0.9 % 100 mL IVPB     1 g 200 mL/hr over 30 Minutes Intravenous Every 24 hours 07/23/18 2007     07/23/18 1645  cefTRIAXone (ROCEPHIN) 1 g in sodium chloride 0.9 % 100 mL IVPB     1 g 200 mL/hr over 30 Minutes Intravenous  Once 07/23/18 1644 07/23/18 1831       Subjective: No fever, no chest pain, no shortness of breath.  Pleasantly confused/demented.  In no acute distress.  Patient found to be very weak and deconditioned.  Objective: Vitals:   07/24/18 1644 07/24/18 1649 07/24/18 2109 07/25/18 0534  BP: (!) 134/118 (!) 169/63 (!) 157/82 (!) 146/71  Pulse:   64 (!) 57  Resp:   18 18  Temp:   98.4 F (36.9 C) (!) 97.5 F (36.4 C)  TempSrc:   Oral Oral  SpO2:   100% 100%  Weight:      Height:        Intake/Output Summary (Last 24 hours) at 07/25/2018 1437 Last  data filed at 07/25/2018 1100 Gross per 24 hour  Intake 524 ml  Output 300 ml  Net 224 ml   Filed Weights   07/23/18 2052  Weight: 65.7 kg    Examination: General exam: Alert, awake, oriented x 1; having difficulty following simple commands.  Patient presently confused.  No chest pain, no shortness of breath, no vomiting. Respiratory system: Clear to auscultation. Respiratory effort normal. Cardiovascular  system:RRR. No murmurs, rubs, gallops. Gastrointestinal system: Abdomen is nondistended, soft and nontender. No organomegaly or masses felt. Normal bowel sounds heard. Central nervous system: Alert and oriented. No focal neurological deficits. Extremities: No C/C/E, +pedal pulses Skin: No rashes, lesions or ulcers Psychiatry: Judgement and insight appear normal. Mood & affect appropriate.    Data Reviewed: I have personally reviewed following labs and imaging studies  CBC: Recent Labs  Lab 07/18/18 1725 07/23/18 1547 07/24/18 0731  WBC 8.3 15.6* 11.4*  NEUTROABS 5.5 12.9*  --   HGB 10.9* 12.2 10.1*  HCT 35.4* 42.4 33.6*  MCV 87.2 90.2 86.4  PLT 282 246 846   Basic Metabolic Panel: Recent Labs  Lab 07/18/18 1942 07/23/18 1547 07/24/18 0731  NA 147* 149* 147*  K 3.7 4.4 4.1  CL 115* 117* 113*  CO2 21* 19* 25  GLUCOSE 96 115* 124*  BUN 30* 35* 29*  CREATININE 0.94 1.16* 0.89  CALCIUM 8.9 9.1 8.4*   GFR: Estimated Creatinine Clearance: 46.3 mL/min (by C-G formula based on SCr of 0.89 mg/dL).   Liver Function Tests: Recent Labs  Lab 07/18/18 1942  AST 21  ALT 17  ALKPHOS 63  BILITOT 0.5  PROT 6.8  ALBUMIN 3.0*    Cardiac Enzymes: Recent Labs  Lab 07/18/18 1942  TROPONINI <0.03   Urine analysis:    Component Value Date/Time   COLORURINE YELLOW 07/23/2018 1516   APPEARANCEUR TURBID (A) 07/23/2018 1516   LABSPEC 1.021 07/23/2018 1516   PHURINE 7.0 07/23/2018 1516   GLUCOSEU NEGATIVE 07/23/2018 1516   GLUCOSEU NEGATIVE 04/14/2016 0952   HGBUR MODERATE (A) 07/23/2018 1516   BILIRUBINUR NEGATIVE 07/23/2018 1516   KETONESUR NEGATIVE 07/23/2018 1516   PROTEINUR 100 (A) 07/23/2018 1516   UROBILINOGEN 0.2 04/14/2016 0952   NITRITE NEGATIVE 07/23/2018 1516   LEUKOCYTESUR MODERATE (A) 07/23/2018 1516    Recent Results (from the past 240 hour(s))  Urine culture     Status: Abnormal   Collection Time: 07/23/18  3:15 PM  Result Value Ref Range Status    Specimen Description URINE, CATHETERIZED  Final   Special Requests   Final    NONE Performed at [facility], [place] [address] [place], [place] [address]    Culture >=100,000 COLONIES/mL ESCHERICHIA COLI (A)  Final   Report Status 07/25/2018 FINAL  Final   Organism ID, Bacteria ESCHERICHIA COLI (A)  Final      Susceptibility   Escherichia coli - MIC*    AMPICILLIN <=2 SENSITIVE Sensitive     CEFAZOLIN <=4 SENSITIVE Sensitive     CEFTRIAXONE <=1 SENSITIVE Sensitive     CIPROFLOXACIN <=0.25 SENSITIVE Sensitive     GENTAMICIN <=1 SENSITIVE Sensitive     IMIPENEM <=0.25 SENSITIVE Sensitive     NITROFURANTOIN <=16 SENSITIVE Sensitive     TRIMETH/SULFA <=20 SENSITIVE Sensitive     AMPICILLIN/SULBACTAM <=2 SENSITIVE Sensitive     PIP/TAZO <=4 SENSITIVE Sensitive     Extended ESBL NEGATIVE Sensitive     * >=100,000 COLONIES/mL ESCHERICHIA COLI  Culture, blood (routine x 2)  Status: None (Preliminary result)   Collection Time: 07/23/18  5:10 PM  Result Value Ref Range Status   Specimen Description BLOOD RIGHT ARM  Final   Special Requests   Final    BOTTLES DRAWN AEROBIC AND ANAEROBIC Blood Culture adequate volume   Culture   Final    NO GROWTH 2 DAYS Performed at [facility], [address] [place], [place] [address]    Report Status PENDING  Incomplete  Culture, blood (routine x 2)     Status: None (Preliminary result)   Collection Time: 07/23/18  5:25 PM  Result Value Ref Range Status   Specimen Description BLOOD RIGHT HAND  Final   Special Requests AEROBIC BOTTLE ONLY Blood Culture adequate volume  Final   Culture   Final    NO GROWTH 2 DAYS Performed at [facility], [place] [address] [place], [place] [address]    Report Status PENDING  Incomplete  SARS Coronavirus 2 (CEPHEID - Performed in [place] hospital lab), Hosp Order     Status: None   Collection Time: 07/23/18  6:09 PM  Result Value Ref Range Status   SARS Coronavirus 2 NEGATIVE NEGATIVE Final     Comment: (NOTE) If result is NEGATIVE SARS-CoV-2 target nucleic acids are NOT DETECTED. The SARS-CoV-2 RNA is generally detectable in upper and lower  respiratory specimens during the acute phase of infection. The lowest  concentration of SARS-CoV-2 viral copies this assay can detect is 250  copies / mL. A negative result does not preclude SARS-CoV-2 infection  and should not be used as the sole basis for treatment or other  patient management decisions.  A negative result may occur with  improper specimen collection / handling, submission of specimen other  than nasopharyngeal swab, presence of viral mutation(s) within the  areas targeted by this assay, and inadequate number of viral copies  (<250 copies / mL). A negative result must be combined with clinical  observations, patient history, and epidemiological information. If result is POSITIVE SARS-CoV-2 target nucleic acids are DETECTED. The SARS-CoV-2 RNA is generally detectable in upper and lower  respiratory specimens dur ing the acute phase of infection.  Positive  results are indicative of active infection with SARS-CoV-2.  Clinical  correlation with patient history and other diagnostic information is  necessary to determine patient infection status.  Positive results do  not rule out bacterial infection or co-infection with other viruses. If result is PRESUMPTIVE POSTIVE SARS-CoV-2 nucleic acids MAY BE PRESENT.   A presumptive positive result was obtained on the submitted specimen  and confirmed on repeat testing.  While 2019 novel coronavirus  (SARS-CoV-2) nucleic acids may be present in the submitted sample  additional confirmatory testing may be necessary for epidemiological  and / or clinical management purposes  to differentiate between  SARS-CoV-2 and other Sarbecovirus currently known to infect humans.  If clinically indicated additional testing with an alternate test  methodology [phone]) is advised. The  SARS-CoV-2 RNA is generally  detectable in upper and lower respiratory sp ecimens during the acute  phase of infection. The expected result is Negative. Fact Sheet for Patients:  StrictlyIdeas.no Fact Sheet for Healthcare Providers: BankingDealers.co.za This test is not yet approved or cleared by the [place] FDA and has been authorized for detection and/or diagnosis of SARS-CoV-2 by FDA under an Emergency Use Authorization (EUA).  This EUA will remain in effect (meaning this test can be used) for the duration of the COVID-19 declaration  under Section 564(b)(1) of the Act, 21 U.S.C. section 360bbb-3(b)(1), unless the authorization is terminated or revoked sooner. Performed at [facility], [place] [address], [place], [place] [address]      Radiology Studies: Dg Chest Portable 1 View  Result Date: 07/23/2018 CLINICAL DATA:  Fever EXAM: PORTABLE CHEST 1 VIEW COMPARISON:  07/02/2018 FINDINGS: Heart is borderline in size. Lungs clear. No effusions or acute bony abnormality. IMPRESSION: No active disease. Electronically Signed   By: Rolm Baptise M.D.   On: 07/23/2018 17:05    Scheduled Meds: . donepezil  23 mg Oral QHS  . rivaroxaban  20 mg Oral Q supper  . Vitamin D (Ergocalciferol)  50,000 Units Oral Q30 days   Continuous Infusions: . cefTRIAXone (ROCEPHIN)  IV 1 g (07/24/18 1823)     LOS: 2 days    Time spent: 30 minutes    Barton Dubois, MD [facility] Pager [phone]  07/25/2018, 2:37 PM

## 2018-07-25 NOTE — TOC Progression Note (Signed)
Transition of Care [facility]) - Progression Note    Patient Details  Name: Courtney Day MRN: [id number] Date of Birth: [birth year]  Transition of Care [facility]) CM/SW Contact  Mearl Latin, LCSW Phone Number: 07/25/2018, 2:23 PM  Clinical Narrative:    CSW spoke with patient's spouse and provided bed offers.   Expected Discharge Plan: Memory Care(Undetermined at this time) Barriers to Discharge: Continued Medical Work up  Expected Discharge Plan and Services Expected Discharge Plan: Memory Care(Undetermined at this time) In-house Referral: Clinical Social Work   Post Acute Care Choice: (Undetermined at this time) Living arrangements for the past 2 months: Single Family Home                 DME Arranged: N/A         HH Arranged: NA           Social Determinants of Health (SDOH) Interventions    Readmission Risk Interventions No flowsheet data found.

## 2018-07-25 NOTE — NC FL2 (Addendum)
[place] MEDICAID FL2 LEVEL OF CARE SCREENING TOOL     IDENTIFICATION  Patient Name: Courtney Day Birthdate: Jul 05, 1934 Sex: female Admission Date (Current Location): 07/23/2018  [facility] [place]   Facility and Address:  [facility], [address], [place], [place] [address]      Provider Number: [id number]  Attending Physician Name and Address:  Vassie LollMadera, Carlos, MD  Relative Name and Phone Number:  [name], spouse, [phone]    Current Level of Care: Hospital Recommended Level of Care: Skilled Nursing Facility Prior Approval Number:    Date Approved/Denied:   PASRR Number: [id number]  Discharge Plan: SNF    Current Diagnoses: Patient Active Problem List   Diagnosis Date Noted  . Sepsis secondary to UTI (HCC) 07/23/2018  . Rash and nonspecific skin eruption 07/06/2018  . DVT (deep venous thrombosis) (HCC) 07/05/2018  . Well adult exam 05/18/2013  . Right-sided chest wall pain 09/01/2010  . BREAST PAIN 03/18/2010  . Alzheimer's disease (HCC) 11/26/2009  . Vitiligo 11/26/2009  . PULMONARY NODULE 05/22/2008  . HYPOKALEMIA 05/14/2008  . VERTIGO 05/14/2008  . ALLERGIC RHINITIS 04/27/2008  . Asthma 04/27/2008  . UPPER RESPIRATORY INFECTION (URI) 03/26/2008  . Nonspecific (abnormal) findings on radiological and other examination of body structure 03/26/2008  . CHEST XRAY, ABNORMAL 03/26/2008  . SINUSITIS, ACUTE 02/28/2008  . Osteoarthritis 07/06/2007  . SHOULDER PAIN 07/06/2007  . LEG PAIN 05/03/2007  . Situational depression 12/24/2006  . Essential hypertension 12/24/2006  . LOW BACK PAIN 12/24/2006    Orientation RESPIRATION BLADDER Height & Weight     Self  Normal Incontinent, External catheter Weight: 144 lb 13.5 oz (65.7 kg) Height:  5\' 4"  (162.6 cm)  BEHAVIORAL SYMPTOMS/MOOD NEUROLOGICAL BOWEL NUTRITION STATUS  (Dementia, no noted behaviors)   Continent Diet(Please see DC Summary)  AMBULATORY STATUS  COMMUNICATION OF NEEDS Skin   Extensive Assist Verbally Other (Comment)(wound on buttocks; laceration on eye; non-pressure wound on elbow)                       Personal Care Assistance Level of Assistance  Bathing, Feeding, Dressing Bathing Assistance: Maximum assistance Feeding assistance: Limited assistance Dressing Assistance: Maximum assistance     Functional Limitations Info  Sight, Hearing, Speech Sight Info: Adequate Hearing Info: Adequate Speech Info: Adequate    SPECIAL CARE FACTORS FREQUENCY  PT (By licensed PT), OT (By licensed OT)     PT Frequency: 5x/week OT Frequency: 3x/week            Contractures Contractures Info: Not present    Additional Factors Info  Code Status, Allergies, Psychotropic Code Status Info: DNR Allergies Info: Ace Inhibitors, Amlodipine, Amlodipine Besylate-valsartan, Namenda Memantine Hcl, Valsartan Psychotropic Info: Aricept         Current Medications (07/25/2018):  This is the current hospital active medication list Current Facility-Administered Medications  Medication Dose Route Frequency Provider Last Rate Last Dose  . acetaminophen (TYLENOL) tablet 650 mg  650 mg Oral Q6H PRN Charlsie QuestPatel, Vishal R, MD       Or  . acetaminophen (TYLENOL) suppository 650 mg  650 mg Rectal Q6H PRN Darreld McleanPatel, Vishal R, MD      . cefTRIAXone (ROCEPHIN) 1 g in sodium chloride 0.9 % 100 mL IVPB  1 g Intravenous Q24H Darreld McleanPatel, Vishal R, MD 200 mL/hr at 07/24/18 1823 1 g at 07/24/18 1823  . donepezil (ARICEPT) tablet 23 mg  23 mg Oral QHS Allena KatzPatel, Vishal  R, MD   23 mg at 07/24/18 2157  . hydrALAZINE (APRESOLINE) injection 5 mg  5 mg Intravenous Q6H PRN Darreld Mclean R, MD      . ondansetron (ZOFRAN) tablet 4 mg  4 mg Oral Q6H PRN Charlsie Quest, MD       Or  . ondansetron (ZOFRAN) injection 4 mg  4 mg Intravenous Q6H PRN Darreld Mclean R, MD      . rivaroxaban (XARELTO) tablet 20 mg  20 mg Oral Q supper Charlsie Quest, MD      . Vitamin D (Ergocalciferol)  (DRISDOL) capsule 50,000 Units  50,000 Units Oral Q30 days Charlsie Quest, MD   50,000 Units at 07/24/18 1779     Discharge Medications: Please see discharge summary for a list of discharge medications.  Relevant Imaging Results:  Relevant Lab Results:   Additional Information SSN: [id number]  Negative COVID on 5/30  Mearl Latin, LCSW

## 2018-07-26 DIAGNOSIS — D649 Anemia, unspecified: Secondary | ICD-10-CM | POA: Diagnosis not present

## 2018-07-26 DIAGNOSIS — N390 Urinary tract infection, site not specified: Secondary | ICD-10-CM | POA: Diagnosis not present

## 2018-07-26 DIAGNOSIS — I82402 Acute embolism and thrombosis of unspecified deep veins of left lower extremity: Secondary | ICD-10-CM | POA: Diagnosis not present

## 2018-07-26 DIAGNOSIS — F0280 Dementia in other diseases classified elsewhere without behavioral disturbance: Secondary | ICD-10-CM | POA: Diagnosis not present

## 2018-07-26 DIAGNOSIS — G301 Alzheimer's disease with late onset: Secondary | ICD-10-CM | POA: Diagnosis not present

## 2018-07-26 DIAGNOSIS — M2550 Pain in unspecified joint: Secondary | ICD-10-CM | POA: Diagnosis not present

## 2018-07-26 DIAGNOSIS — L89322 Pressure ulcer of left buttock, stage 2: Secondary | ICD-10-CM | POA: Diagnosis not present

## 2018-07-26 DIAGNOSIS — N179 Acute kidney failure, unspecified: Secondary | ICD-10-CM | POA: Diagnosis not present

## 2018-07-26 DIAGNOSIS — B9620 Unspecified Escherichia coli [E. coli] as the cause of diseases classified elsewhere: Secondary | ICD-10-CM | POA: Diagnosis not present

## 2018-07-26 DIAGNOSIS — R278 Other lack of coordination: Secondary | ICD-10-CM | POA: Diagnosis not present

## 2018-07-26 DIAGNOSIS — A419 Sepsis, unspecified organism: Secondary | ICD-10-CM | POA: Diagnosis not present

## 2018-07-26 DIAGNOSIS — I82512 Chronic embolism and thrombosis of left femoral vein: Secondary | ICD-10-CM | POA: Diagnosis not present

## 2018-07-26 DIAGNOSIS — R2681 Unsteadiness on feet: Secondary | ICD-10-CM | POA: Diagnosis not present

## 2018-07-26 DIAGNOSIS — R627 Adult failure to thrive: Secondary | ICD-10-CM | POA: Diagnosis not present

## 2018-07-26 DIAGNOSIS — R4182 Altered mental status, unspecified: Secondary | ICD-10-CM | POA: Diagnosis not present

## 2018-07-26 DIAGNOSIS — Z7401 Bed confinement status: Secondary | ICD-10-CM | POA: Diagnosis not present

## 2018-07-26 DIAGNOSIS — I82409 Acute embolism and thrombosis of unspecified deep veins of unspecified lower extremity: Secondary | ICD-10-CM | POA: Diagnosis not present

## 2018-07-26 DIAGNOSIS — R41841 Cognitive communication deficit: Secondary | ICD-10-CM | POA: Diagnosis not present

## 2018-07-26 DIAGNOSIS — G309 Alzheimer's disease, unspecified: Secondary | ICD-10-CM | POA: Diagnosis not present

## 2018-07-26 DIAGNOSIS — E871 Hypo-osmolality and hyponatremia: Secondary | ICD-10-CM | POA: Diagnosis not present

## 2018-07-26 DIAGNOSIS — M1990 Unspecified osteoarthritis, unspecified site: Secondary | ICD-10-CM | POA: Diagnosis not present

## 2018-07-26 DIAGNOSIS — R404 Transient alteration of awareness: Secondary | ICD-10-CM | POA: Diagnosis not present

## 2018-07-26 DIAGNOSIS — I10 Essential (primary) hypertension: Secondary | ICD-10-CM | POA: Diagnosis not present

## 2018-07-26 DIAGNOSIS — R410 Disorientation, unspecified: Secondary | ICD-10-CM | POA: Diagnosis not present

## 2018-07-26 DIAGNOSIS — L988 Other specified disorders of the skin and subcutaneous tissue: Secondary | ICD-10-CM | POA: Diagnosis not present

## 2018-07-26 LAB — BASIC METABOLIC PANEL
Anion gap: 8 (ref 5–15)
BUN: 29 mg/dL — ABNORMAL HIGH (ref 8–23)
CO2: 24 mmol/L (ref 22–32)
Calcium: 8.4 mg/dL — ABNORMAL LOW (ref 8.9–10.3)
Chloride: 110 mmol/L (ref 98–111)
Creatinine, Ser: 0.8 mg/dL (ref 0.44–1.00)
GFR calc Af Amer: >60 mL/min (ref >60)
GFR calc non Af Amer: >60 mL/min (ref >60)
Glucose, Bld: 130 mg/dL — ABNORMAL HIGH (ref 70–99)
Potassium: 3.5 mmol/L (ref 3.5–5.1)
Sodium: 142 mmol/L (ref 135–145)

## 2018-07-26 MED ORDER — ACETAMINOPHEN 325 MG PO TABS: 650.0000 mg | ORAL_TABLET | Freq: Four times a day (QID) | ORAL | Status: DC | PRN

## 2018-07-26 MED ORDER — RIVAROXABAN 20 MG PO TABS: 20.0000 mg | ORAL_TABLET | Freq: Every day | ORAL | Status: DC

## 2018-07-26 MED ORDER — HYDRALAZINE HCL 10 MG PO TABS: 10.0000 mg | ORAL_TABLET | Freq: Two times a day (BID) | ORAL | Status: DC

## 2018-07-26 MED ORDER — CEPHALEXIN 500 MG PO CAPS: 500.0000 mg | ORAL_CAPSULE | Freq: Two times a day (BID) | ORAL | 0 refills | Status: AC

## 2018-07-26 MED ORDER — VITAMIN D (ERGOCALCIFEROL) 1.25 MG (50000 UNIT) PO CAPS: 50000.0000 [IU] | ORAL_CAPSULE | ORAL | Status: DC

## 2018-07-26 NOTE — TOC Transition Note (Signed)
Transition of Care [facility]) - CM/SW Discharge Note   Patient Details  Name: Courtney Day MRN: [id number] Date of Birth: October 01, 1934  [facility]) CM/SW Contact:  Mearl Latin, LCSW Phone Number: 07/26/2018, 1:24 PM   Clinical Narrative:    Patient will DC to: [facility] Anticipated DC date: 07/26/18 Family notified: Spouse Transport by: Audie Clear   Per MD patient ready for DC to [facility]. RN, patient, patient's family, and facility notified of DC. Discharge Summary and FL2 sent to facility. RN to call report prior to discharge [phone] Room 3218). DC packet on chart. Ambulance transport requested for patient.   CSW will sign off for now as social work intervention is no longer needed. Please consult [place] again if new needs arise.  Cristobal Goldmann, LCSW Clinical Social Worker [phone]    Final next level of care: Skilled Nursing Facility Barriers to Discharge: No Barriers Identified   Patient Goals and CMS Choice Patient states their goals for this hospitalization and ongoing recovery are:: Per pt's spouse "to get pt somewhere" CMS Medicare.gov Compare Post Acute Care list provided to:: Other (Comment Required)(Spouse) Choice offered to / list presented to : Spouse  Discharge Placement PASRR number recieved: 07/25/18            Patient chooses bed at: [facility] Patient to be transferred to facility by: [facility] Name of family member notified: Ivar Drape, spouse Patient and family notified of of transfer: 07/26/18  Discharge Plan and Services In-house Referral: Clinical Social Work   Post Acute Care Choice: (Undetermined at this time)          DME Arranged: N/A DME Agency: NA       HH Arranged: NA HH Agency: NA        Social Determinants of Health (SDOH) Interventions     Readmission Risk Interventions No flowsheet data found.

## 2018-07-26 NOTE — Telephone Encounter (Signed)
Form placed in folder and given to provider

## 2018-07-26 NOTE — Discharge Instructions (Signed)
Information on my medicine - XARELTO (rivaroxaban)  This medication education was reviewed with me or my healthcare representative as part of my discharge preparation.    WHY WAS XARELTO PRESCRIBED FOR YOU? Xarelto was prescribed to treat blood clots that may have been found in the veins of your legs (deep vein thrombosis) or in your lungs (pulmonary embolism) and to reduce the risk of them occurring again.  What do you need to know about Xarelto? You have completed the initial part of your regimen and your discharge dose is now one 20 mg tablet taken ONCE A DAY with your evening meal.  DO NOT stop taking Xarelto without talking to the health care provider who prescribed the medication.  Refill your prescription for 20 mg tablets before you run out.  After discharge, you should have regular check-up appointments with your healthcare provider that is prescribing your Xarelto.  In the future your dose may need to be changed if your kidney function changes by a significant amount.  What do you do if you miss a dose? If you are taking Xarelto ONCE DAILY and you miss a dose, take it as soon as you remember on the same day then continue your regularly scheduled once daily regimen the next day. Do not take two doses of Xarelto at the same time.   Important Safety Information Xarelto is a blood thinner medicine that can cause bleeding. You should call your healthcare provider right away if you experience any of the following: ? Bleeding from an injury or your nose that does not stop. ? Unusual colored urine (red or dark brown) or unusual colored stools (red or black). ? Unusual bruising for unknown reasons. ? A serious fall or if you hit your head (even if there is no bleeding).  Some medicines may interact with Xarelto and might increase your risk of bleeding while on Xarelto. To help avoid this, consult your healthcare provider or pharmacist prior to using any new prescription or  non-prescription medications, including herbals, vitamins, non-steroidal anti-inflammatory drugs (NSAIDs) and supplements.  This website has more information on Xarelto: VisitDestination.com.br.

## 2018-07-26 NOTE — Discharge Summary (Signed)
Physician Discharge Summary  Courtney Day BLT:[id number] DOB: 01/22/1935 DOA: 07/23/2018  PCP: Courtney Anger, MD  Admit date: 07/23/2018 Discharge date: 07/26/2018  Time spent: 35 minutes  Recommendations for Outpatient Follow-up:  1. Repeat basic metabolic panel to follow electrolytes and renal function 2. Reassess blood pressure and further adjust antihypertensive regimen as needed. 3. Patient is DNR as per CODE STATUS discussion with her husband..   Discharge Diagnoses:  Principal Problem:   Sepsis secondary to UTI [facility]) Active Problems:   Alzheimer's disease ([place])   Essential hypertension   DVT (deep venous thrombosis) ([place])   AKI (acute kidney injury) ([place])   Discharge Condition: Stable and improved.  Patient discharged to skilled nursing facility for further care and rehabilitation.  Diet recommendation: Heart healthy diet  Filed Weights   07/23/18 2052  Weight: 65.7 kg    History of present illness:  As per H&P written by Dr. Posey Pronto on 07/23/2018 83 y.o.femalewith medical history significant forAlzheimer's dementia, hypertension, DVT on Xareltowhopresents to the ED with confusion and failure to thrive. Patient is unable to provide any history due to dementia, therefore entirety of history is obtained from husband by phone, EDP, and chart review. Per husband, patient has underlying Alzheimer's dementia with intermittent confusion. Previously she was able to walk on her own and complete some ADLs. However over the last month she has had functional decline. She was found to have a left lower extremity DVT on May 9 and was started on Xarelto. Since that time she has had worsening confusion and inability to perform any ADLs. Husband has had a hard time caring for her and has been seeking placement due to her worsening functional status. Due to continued weakness, worsening lethargy, and an episode of emesis earlier today, he called EMS who brought herto the  ED.  ED Course:  Initial vitals showed BP 130/57, pulse 77, RR 14, temp 90.4 Fahrenheit, SPO2 98% on room air.  Labs are notable for WBC 15.6, hemoglobin 12.2, platelets 246,000, sodium 149, potassium 4.4, BUN 35, creatinine 1.16, lactic acid 2.2.  Urinalysis showed moderate leukocytes, negative nitrites, many bacteria, RBCs, WBCs on microscopy.  Blood and urine cultures were obtained and pending.  SARS-CoV-2 test was collected and in process.  Portable chest x-ray was negative for focal consolidation, effusion, or edema.  Patient was given 1 L normal saline and started on IV ceftriaxone and the hospital service was consulted admit for further evaluation and management.  Hospital Course:  1-Sepsis secondary to UTI [facility]): Pansensitive E. coli -Patient met sepsis criteria at the time of admission (with elevated heart rate elevated WBCs, elevated temperature, worsening renal function and worsening mentation; Urinalysis suggesting UTI. -Continue adequate hydration -Following sensitivity will transition antibiotics to oral regimen; patient discharged on Keflex by mouth with intention to complete 3 more days of antibiotic therapy -No dysuria at time of discharge -Sepsis features resolved.  2-Alzheimer's disease ([place]) -Continue donepezil -continue supportive care  3-Essential hypertension -Stable and rising -HCTZ has been discontinued -Will recommend low-sodium diet -Patient is started on hydralazine 10 mg twice a day -Follow vital signs and further adjust antihypertensive regimen as needed.  4-DVT (deep venous thrombosis) ([place]) -continue Xarelto for VTE treatment  5-physical deconditioning  -will follow rec's by PT/OT -patient now having difficulties performing ADL's  -SW on board and will follow rec's for safe discharge plans. -Patient will be discharged to [facility] skilled nursing facility for further care and rehabilitation.  6-dehydration/hypernatremia -Improved and  resolved after fluid  resuscitation. -encourage oral intake.  7-AKI -pre-renal in nature from dehydration; continue use of diuretics and concerns for UTI also contributing. -Resolved and back to normal after fluid resuscitation -Continue antibiotics by mouth to complete treatment for UTI infection -Patient advised to maintain adequate hydration -Repeat basic metabolic panel in 5 days to reassess electrolytes and renal function and stability.   Procedures:  See below for x-ray reports  Consultations:  None  Discharge Exam: Vitals:   07/25/18 2127 07/26/18 0644  BP: (!) 165/69 (!) 160/115  Pulse: 63 66  Resp:    Temp: 99.2 F (37.3 C) 98.2 F (36.8 C)  SpO2: 100% 100%   General exam: Alert, awake, oriented x 1; having difficulty following simple commands.  Patient presently confused.  No chest pain, no shortness of breath, no vomiting. Respiratory system: Clear to auscultation. Respiratory effort normal. Cardiovascular system:RRR. No murmurs, rubs, gallops. Gastrointestinal system: Abdomen is nondistended, soft and nontender. No organomegaly or masses felt. Normal bowel sounds heard. Central nervous system: Alert and oriented. No focal neurological deficits. Extremities: No C/C/E, +pedal pulses Skin: No rashes, lesions or ulcers Psychiatry: Judgement and insight appear normal. Mood & affect appropriate.    Discharge Instructions   Discharge Instructions    Diet - low sodium heart healthy   Complete by:  As directed    Discharge instructions   Complete by:  As directed    Maintain adequate hydration Medications as prescribed Repeat basic metabolic panel in 5 days to follow electrolytes and renal function Further care, rehabilitation and conditioning as per the skilled nursing facility protocol. 3 more days of antibiotics pending at time of discharge.     Allergies as of 07/26/2018      Reactions   Ace Inhibitors Cough   Amlodipine Other (See Comments)   dizziness    Amlodipine Besylate-valsartan Other (See Comments)   dizziness   Namenda [memantine Hcl] Other (See Comments)   Unknown reaction   Valsartan Other (See Comments)   dizziness      Medication List    STOP taking these medications   hydrochlorothiazide 25 MG tablet Commonly known as:  HYDRODIURIL   neomycin-bacitracin-polymyxin Oint Commonly known as:  NEOSPORIN   Rivaroxaban 15 & 20 MG Tbpk Replaced by:  rivaroxaban 20 MG Tabs tablet   Vitamin D3 1.25 MG (50000 UT) Caps     TAKE these medications   acetaminophen 325 MG tablet Commonly known as:  TYLENOL Take 2 tablets (650 mg total) by mouth every 6 (six) hours as needed for mild pain (or Fever >/= 101).   cephALEXin 500 MG capsule Commonly known as:  KEFLEX Take 1 capsule (500 mg total) by mouth every 12 (twelve) hours for 3 days.   donepezil 23 MG Tabs tablet Commonly known as:  ARICEPT Take 1 tablet (23 mg total) by mouth at bedtime.   hydrALAZINE 10 MG tablet Commonly known as:  APRESOLINE Take 1 tablet (10 mg total) by mouth 2 (two) times a day.   Knee Compression Sleeve/S/M Misc 1 each by Does not apply route daily. Put on left leg daily and take off at night   rivaroxaban 20 MG Tabs tablet Commonly known as:  XARELTO Take 1 tablet (20 mg total) by mouth daily with supper. Replaces:  Rivaroxaban 15 & 20 MG Tbpk   triamcinolone cream 0.5 % Commonly known as:  KENALOG Apply 1 application topically 3 (three) times daily.   Vitamin D (Ergocalciferol) 1.25 MG (50000 UT) Caps capsule Commonly known as:  DRISDOL Take 1 capsule (50,000 Units total) by mouth every 30 (thirty) days. Start taking on:  August 23, 2018      Allergies  Allergen Reactions  . Ace Inhibitors Cough  . Amlodipine Other (See Comments)    dizziness  . Amlodipine Besylate-Valsartan Other (See Comments)    dizziness  . Namenda [Memantine Hcl] Other (See Comments)    Unknown reaction  . Valsartan Other (See Comments)    dizziness     Contact information for follow-up providers    Plotnikov, Evie Lacks, MD Follow up in 10 day(s).   Specialty:  Internal Medicine Why:  After discharge from the skilled nursing facility Contact information: [place] [address] [phone]            Contact information for after-discharge care    Destination    [facility] Preferred SNF .   Service:  Skilled Nursing Contact information: [place] [phone]                   The results of significant diagnostics from this hospitalization (including imaging, microbiology, ancillary and laboratory) are listed below for reference.    Significant Diagnostic Studies: Dg Chest Portable 1 View  Result Date: 07/23/2018 CLINICAL DATA:  Fever EXAM: PORTABLE CHEST 1 VIEW COMPARISON:  07/02/2018 FINDINGS: Heart is borderline in size. Lungs clear. No effusions or acute bony abnormality. IMPRESSION: No active disease. Electronically Signed   By: Rolm Baptise M.D.   On: 07/23/2018 17:05    Microbiology: Recent Results (from the past 240 hour(s))  Urine culture     Status: Abnormal   Collection Time: 07/23/18  3:15 PM  Result Value Ref Range Status   Specimen Description URINE, CATHETERIZED  Final   Special Requests   Final    NONE Performed at [facility], [address] [place], [place] [address]    Culture >=100,000 COLONIES/mL ESCHERICHIA COLI (A)  Final   Report Status 07/25/2018 FINAL  Final   Organism ID, Bacteria ESCHERICHIA COLI (A)  Final      Susceptibility   Escherichia coli - MIC*    AMPICILLIN <=2 SENSITIVE Sensitive     CEFAZOLIN <=4 SENSITIVE Sensitive     CEFTRIAXONE <=1 SENSITIVE Sensitive     CIPROFLOXACIN <=0.25 SENSITIVE Sensitive     GENTAMICIN <=1 SENSITIVE Sensitive     IMIPENEM <=0.25 SENSITIVE Sensitive     NITROFURANTOIN <=16 SENSITIVE Sensitive     TRIMETH/SULFA <=20 SENSITIVE Sensitive      AMPICILLIN/SULBACTAM <=2 SENSITIVE Sensitive     PIP/TAZO <=4 SENSITIVE Sensitive     Extended ESBL NEGATIVE Sensitive     * >=100,000 COLONIES/mL ESCHERICHIA COLI  Culture, blood (routine x 2)     Status: None (Preliminary result)   Collection Time: 07/23/18  5:10 PM  Result Value Ref Range Status   Specimen Description BLOOD RIGHT ARM  Final   Special Requests   Final    BOTTLES DRAWN AEROBIC AND ANAEROBIC Blood Culture adequate volume   Culture   Final    NO GROWTH 3 DAYS Performed at [facility], [address], [place], [place] [address]    Report Status PENDING  Incomplete  Culture, blood (routine x 2)     Status: None (Preliminary result)   Collection Time: 07/23/18  5:25 PM  Result Value Ref Range Status   Specimen Description BLOOD RIGHT HAND  Final   Special Requests  AEROBIC BOTTLE ONLY Blood Culture adequate volume  Final   Culture   Final    NO GROWTH 3 DAYS Performed at [facility], [place] [address], [place], [place] [address]    Report Status PENDING  Incomplete  SARS Coronavirus 2 (CEPHEID - Performed in [place] hospital lab), Hosp Order     Status: None   Collection Time: 07/23/18  6:09 PM  Result Value Ref Range Status   SARS Coronavirus 2 NEGATIVE NEGATIVE Final    Comment: (NOTE) If result is NEGATIVE SARS-CoV-2 target nucleic acids are NOT DETECTED. The SARS-CoV-2 RNA is generally detectable in upper and lower  respiratory specimens during the acute phase of infection. The lowest  concentration of SARS-CoV-2 viral copies this assay can detect is 250  copies / mL. A negative result does not preclude SARS-CoV-2 infection  and should not be used as the sole basis for treatment or other  patient management decisions.  A negative result may occur with  improper specimen collection / handling, submission of specimen other  than nasopharyngeal swab, presence of viral mutation(s) within the  areas targeted by this assay, and inadequate number of  viral copies  (<250 copies / mL). A negative result must be combined with clinical  observations, patient history, and epidemiological information. If result is POSITIVE SARS-CoV-2 target nucleic acids are DETECTED. The SARS-CoV-2 RNA is generally detectable in upper and lower  respiratory specimens dur ing the acute phase of infection.  Positive  results are indicative of active infection with SARS-CoV-2.  Clinical  correlation with patient history and other diagnostic information is  necessary to determine patient infection status.  Positive results do  not rule out bacterial infection or co-infection with other viruses. If result is PRESUMPTIVE POSTIVE SARS-CoV-2 nucleic acids MAY BE PRESENT.   A presumptive positive result was obtained on the submitted specimen  and confirmed on repeat testing.  While 2019 novel coronavirus  (SARS-CoV-2) nucleic acids may be present in the submitted sample  additional confirmatory testing may be necessary for epidemiological  and / or clinical management purposes  to differentiate between  SARS-CoV-2 and other Sarbecovirus currently known to infect humans.  If clinically indicated additional testing with an alternate test  methodology [phone]) is advised. The SARS-CoV-2 RNA is generally  detectable in upper and lower respiratory sp ecimens during the acute  phase of infection. The expected result is Negative. Fact Sheet for Patients:  StrictlyIdeas.no Fact Sheet for Healthcare Providers: BankingDealers.co.za This test is not yet approved or cleared by the [place] FDA and has been authorized for detection and/or diagnosis of SARS-CoV-2 by FDA under an Emergency Use Authorization (EUA).  This EUA will remain in effect (meaning this test can be used) for the duration of the COVID-19 declaration under Section 564(b)(1) of the Act, 21 U.S.C. section 360bbb-3(b)(1), unless the authorization is  terminated or revoked sooner. Performed at [facility], [place] [address], [place], [place] [address]      Labs: Basic Metabolic Panel: Recent Labs  Lab 07/23/18 1547 07/24/18 0731 07/26/18 0236  NA 149* 147* 142  K 4.4 4.1 3.5  CL 117* 113* 110  CO2 19* 25 24  GLUCOSE 115* 124* 130*  BUN 35* 29* 29*  CREATININE 1.16* 0.89 0.80  CALCIUM 9.1 8.4* 8.4*   CBC: Recent Labs  Lab 07/23/18 1547 07/24/18 0731  WBC 15.6* 11.4*  NEUTROABS 12.9*  --   HGB 12.2 10.1*  HCT 42.4 33.6*  MCV  90.2 86.4  PLT 246 229    Signed:  Barton Dubois MD.  [facility] 07/26/2018, 12:03 PM

## 2018-07-26 NOTE — Progress Notes (Addendum)
Nsg Discharge Note  Admit Date:  07/23/2018 Discharge date: 07/26/2018   Doran Heater to be D/C'd Skilled nursing facility per MD order.  AVS completed.  Copy for chart, and copy for patient signed, and dated. Patient/caregiver able to verbalize understanding.  Discharge Medication: Allergies as of 07/26/2018      Reactions   Ace Inhibitors Cough   Amlodipine Other (See Comments)   dizziness   Amlodipine Besylate-valsartan Other (See Comments)   dizziness   Namenda [memantine Hcl] Other (See Comments)   Unknown reaction   Valsartan Other (See Comments)   dizziness      Medication List    STOP taking these medications   hydrochlorothiazide 25 MG tablet Commonly known as:  HYDRODIURIL   neomycin-bacitracin-polymyxin Oint Commonly known as:  NEOSPORIN   Rivaroxaban 15 & 20 MG Tbpk Replaced by:  rivaroxaban 20 MG Tabs tablet   Vitamin D3 1.25 MG (50000 UT) Caps     TAKE these medications   acetaminophen 325 MG tablet Commonly known as:  TYLENOL Take 2 tablets (650 mg total) by mouth every 6 (six) hours as needed for mild pain (or Fever >/= 101).   cephALEXin 500 MG capsule Commonly known as:  KEFLEX Take 1 capsule (500 mg total) by mouth every 12 (twelve) hours for 3 days.   donepezil 23 MG Tabs tablet Commonly known as:  ARICEPT Take 1 tablet (23 mg total) by mouth at bedtime.   hydrALAZINE 10 MG tablet Commonly known as:  APRESOLINE Take 1 tablet (10 mg total) by mouth 2 (two) times a day.   Knee Compression Sleeve/S/M Misc 1 each by Does not apply route daily. Put on left leg daily and take off at night   rivaroxaban 20 MG Tabs tablet Commonly known as:  XARELTO Take 1 tablet (20 mg total) by mouth daily with supper. Replaces:  Rivaroxaban 15 & 20 MG Tbpk   triamcinolone cream 0.5 % Commonly known as:  KENALOG Apply 1 application topically 3 (three) times daily.   Vitamin D (Ergocalciferol) 1.25 MG (50000 UT) Caps capsule Commonly known as:  DRISDOL Take 1  capsule (50,000 Units total) by mouth every 30 (thirty) days. Start taking on:  August 23, 2018       Discharge Assessment: Vitals:   07/26/18 0644 07/26/18 1352  BP: (!) 160/115 (!) 152/107  Pulse: 66 62  Resp:  16  Temp: 98.2 F (36.8 C) 97.8 F (36.6 C)  SpO2: 100% 100%   Skin tear on buttocks, RLE blister, R cheek abrasion, and R elbow wound that is healing.  IV catheter discontinued intact. Site without signs and symptoms of complications - no redness or edema noted at insertion site, patient denies c/o pain - only slight tenderness at site.  Dressing with slight pressure applied.  D/c Instructions-Education: Discharge instructions given to PTAR report called to facility [facility].  Gurveer Colucci Consuella Lose, RN 07/26/2018 5:53 PM

## 2018-07-26 NOTE — Progress Notes (Signed)
Occupational Therapy Treatment Patient Details Name: Courtney Day MRN: [id number] DOB: 1934-08-19 Today's Date: 07/26/2018    History of present illness 84 y.o. female with medical history significant for Alzheimer's dementia, hypertension, DVT on Xarelto who presents to the ED with confusion and failure to thrive.   OT comments  Upon arrival pt sitting supine in bed with HOB elevated to position pt upright. Pt appeared to be attempting to fold sheet linens in her lap, perseverating on this throughout the session. Session focused on maximizing pt's participation and independence with feeding and grooming. Pt able to grab cup with minA to stabilize cup in hand prior to pt bringing it independently towards mouth. Pt initiated washing mouth after OT provided warm cloth, but due to decreased attention to task required total assist for completion of grooming. Pt will continue to benefit from OT services to maximize independence with ADL, positional adjustments, and ROM to maintain skin integrity. Will continue to follow acutely.    Follow Up Recommendations  SNF;Supervision/Assistance - 24 hour    Equipment Recommendations  None recommended by OT    Recommendations for Other Services      Precautions / Restrictions Precautions Precautions: Fall Precaution Comments: advanced dementia  Restrictions Weight Bearing Restrictions: No       Mobility Bed Mobility               General bed mobility comments: supine in bed, pt not following commands due to confusion  Transfers                 General transfer comment: deferred due to safety     Balance                                           ADL either performed or assessed with clinical judgement   ADL Overall ADL's : Needs assistance/impaired Eating/Feeding: Moderate assistance;Total assistance Eating/Feeding Details (indicate cue type and reason): pt answered "yes" when asking if she was thirsty;pt  able to grab cup and drink it with minA for stabilizing cup in hand Grooming: Wash/dry face;Moderate assistance Grooming Details (indicate cue type and reason): provided wash cloth and encouraged pt to wash face;pt initiated washing mouth then required total assist to complete ADL                               General ADL Comments: pt with decreased attention span;initiates task but requires total assist for ADL completion      Vision   Additional Comments: opens able to scan and track objects when handing to pt, able to follow therapist around the room   Perception     Praxis      Cognition Arousal/Alertness: Lethargic Behavior During Therapy: Flat affect Overall Cognitive Status: History of cognitive impairments - at baseline                                 General Comments: pt able to verbalize "yes" when asking if she was thirsty and asking her name;otherwise disoriented        Exercises     Shoulder Instructions       General Comments general UE PROM completed, pt tolerated movement    Pertinent Vitals/ Pain  Pain Assessment: No/denies pain  Home Living                                          Prior Functioning/Environment              Frequency  Min 2X/week        Progress Toward Goals  OT Goals(current goals can now be found in the care plan section)  Progress towards OT goals: Not progressing toward goals - comment(pt with minimal engagement this session)  Acute Rehab OT Goals Patient Stated Goal: non stated this session OT Goal Formulation: Patient unable to participate in goal setting Time For Goal Achievement: 08/07/18 Potential to Achieve Goals: Good ADL Goals Pt Will Perform Eating: with mod assist;sitting Pt/caregiver will Perform Home Exercise Program: Increased strength;Both right and left upper extremity;With minimal assist Additional ADL Goal #1: Pt will follow 1 step commands with  50% accuracy in order to increase participation in ADLs.  Plan Discharge plan remains appropriate    Co-evaluation                 AM-PAC OT "6 Clicks" Daily Activity     Outcome Measure   Help from another person eating meals?: Total Help from another person taking care of personal grooming?: Total Help from another person toileting, which includes using toliet, bedpan, or urinal?: Total Help from another person bathing (including washing, rinsing, drying)?: Total Help from another person to put on and taking off regular upper body clothing?: Total Help from another person to put on and taking off regular lower body clothing?: Total 6 Click Score: 6    End of Session    OT Visit Diagnosis: Feeding difficulties (R63.3);Muscle weakness (generalized) (M62.81)   Activity Tolerance Patient limited by lethargy   Patient Left in bed;with bed alarm set;with call bell/phone within reach   Nurse Communication Mobility status        Time: 7416-3845 OT Time Calculation (min): 9 min  Charges: OT General Charges $OT Visit: 1 Visit OT Treatments $Self Care/Home Management : 8-22 mins  Diona Browner OTR/L Acute Rehabilitation Services Office: [phone]    Rebeca Alert 07/26/2018, 3:27 PM

## 2018-07-27 ENCOUNTER — Telehealth: Payer: Self-pay | Admitting: *Deleted

## 2018-07-27 DIAGNOSIS — Z87440 Personal history of urinary (tract) infections: Secondary | ICD-10-CM

## 2018-07-27 DIAGNOSIS — R627 Adult failure to thrive: Secondary | ICD-10-CM | POA: Diagnosis not present

## 2018-07-27 DIAGNOSIS — N390 Urinary tract infection, site not specified: Secondary | ICD-10-CM | POA: Diagnosis not present

## 2018-07-27 DIAGNOSIS — I82402 Acute embolism and thrombosis of unspecified deep veins of left lower extremity: Secondary | ICD-10-CM | POA: Diagnosis not present

## 2018-07-27 DIAGNOSIS — Z7901 Long term (current) use of anticoagulants: Secondary | ICD-10-CM

## 2018-07-27 DIAGNOSIS — Z90710 Acquired absence of both cervix and uterus: Secondary | ICD-10-CM

## 2018-07-27 DIAGNOSIS — N179 Acute kidney failure, unspecified: Secondary | ICD-10-CM

## 2018-07-27 DIAGNOSIS — F329 Major depressive disorder, single episode, unspecified: Secondary | ICD-10-CM

## 2018-07-27 DIAGNOSIS — L89322 Pressure ulcer of left buttock, stage 2: Secondary | ICD-10-CM

## 2018-07-27 DIAGNOSIS — F0280 Dementia in other diseases classified elsewhere without behavioral disturbance: Secondary | ICD-10-CM

## 2018-07-27 DIAGNOSIS — G309 Alzheimer's disease, unspecified: Secondary | ICD-10-CM | POA: Diagnosis not present

## 2018-07-27 DIAGNOSIS — I10 Essential (primary) hypertension: Secondary | ICD-10-CM

## 2018-07-27 DIAGNOSIS — D649 Anemia, unspecified: Secondary | ICD-10-CM | POA: Diagnosis not present

## 2018-07-27 DIAGNOSIS — E871 Hypo-osmolality and hyponatremia: Secondary | ICD-10-CM | POA: Diagnosis not present

## 2018-07-27 DIAGNOSIS — M1990 Unspecified osteoarthritis, unspecified site: Secondary | ICD-10-CM | POA: Diagnosis not present

## 2018-07-27 DIAGNOSIS — A419 Sepsis, unspecified organism: Secondary | ICD-10-CM | POA: Diagnosis not present

## 2018-07-27 DIAGNOSIS — Z9181 History of falling: Secondary | ICD-10-CM

## 2018-07-27 DIAGNOSIS — M545 Low back pain: Secondary | ICD-10-CM

## 2018-07-27 NOTE — Telephone Encounter (Signed)
Pt was on TCM report admitted 07/23/18 for Sepsis secondary to UTI . Followingsensitivity will transition antibiotics to oral regimen.  Patient discharged 07/26/18 to skilled nursing facility for further care and rehabilitation. Per summary will need to follow=up w/PCP after leaving SNF.Marland KitchenRaechel Chute

## 2018-07-28 LAB — CULTURE, BLOOD (ROUTINE X 2)
Culture: NO GROWTH
Culture: NO GROWTH
Special Requests: ADEQUATE
Special Requests: ADEQUATE

## 2018-07-29 DIAGNOSIS — N179 Acute kidney failure, unspecified: Secondary | ICD-10-CM | POA: Diagnosis not present

## 2018-07-29 DIAGNOSIS — N390 Urinary tract infection, site not specified: Secondary | ICD-10-CM | POA: Diagnosis not present

## 2018-07-29 DIAGNOSIS — I10 Essential (primary) hypertension: Secondary | ICD-10-CM | POA: Diagnosis not present

## 2018-07-29 DIAGNOSIS — G309 Alzheimer's disease, unspecified: Secondary | ICD-10-CM | POA: Diagnosis not present

## 2018-07-29 DIAGNOSIS — I82409 Acute embolism and thrombosis of unspecified deep veins of unspecified lower extremity: Secondary | ICD-10-CM | POA: Diagnosis not present

## 2018-07-29 DIAGNOSIS — A419 Sepsis, unspecified organism: Secondary | ICD-10-CM | POA: Diagnosis not present

## 2018-07-29 NOTE — Telephone Encounter (Signed)
Form faxed

## 2018-08-03 ENCOUNTER — Other Ambulatory Visit: Payer: Self-pay | Admitting: *Deleted

## 2018-08-03 NOTE — Patient Outreach (Signed)
Member assessed for potential [facility] Care Management program needs based on [employer] insurance.  Mrs. Sleep is currently at [facility] SNF receiving rehab therapy.  [facility] RN after her IDT meeting with facility. Disposition plan is for member to transition to long term care.  Will plan to sign off unless disposition plans change.   [name], MSN-Ed, RN,BSN [name] Acute Care Coordinator [phone]

## 2018-08-04 ENCOUNTER — Ambulatory Visit: Payer: MEDICARE | Admitting: Internal Medicine

## 2018-08-10 ENCOUNTER — Other Ambulatory Visit: Payer: Self-pay | Admitting: *Deleted

## 2018-08-12 DIAGNOSIS — I82409 Acute embolism and thrombosis of unspecified deep veins of unspecified lower extremity: Secondary | ICD-10-CM | POA: Diagnosis not present

## 2018-08-12 DIAGNOSIS — G309 Alzheimer's disease, unspecified: Secondary | ICD-10-CM | POA: Diagnosis not present

## 2018-08-12 DIAGNOSIS — I10 Essential (primary) hypertension: Secondary | ICD-10-CM | POA: Diagnosis not present

## 2018-08-24 DIAGNOSIS — Z20828 Contact with and (suspected) exposure to other viral communicable diseases: Secondary | ICD-10-CM | POA: Diagnosis not present

## 2018-08-31 DIAGNOSIS — Z20828 Contact with and (suspected) exposure to other viral communicable diseases: Secondary | ICD-10-CM | POA: Diagnosis not present

## 2018-09-05 ENCOUNTER — Ambulatory Visit: Payer: Self-pay | Admitting: Internal Medicine

## 2018-09-07 DIAGNOSIS — Z20828 Contact with and (suspected) exposure to other viral communicable diseases: Secondary | ICD-10-CM | POA: Diagnosis not present

## 2018-09-09 DIAGNOSIS — G309 Alzheimer's disease, unspecified: Secondary | ICD-10-CM | POA: Diagnosis not present

## 2018-09-09 DIAGNOSIS — I82409 Acute embolism and thrombosis of unspecified deep veins of unspecified lower extremity: Secondary | ICD-10-CM | POA: Diagnosis not present

## 2018-09-09 DIAGNOSIS — I10 Essential (primary) hypertension: Secondary | ICD-10-CM | POA: Diagnosis not present

## 2018-09-14 DIAGNOSIS — Z20828 Contact with and (suspected) exposure to other viral communicable diseases: Secondary | ICD-10-CM | POA: Diagnosis not present

## 2018-09-24 DIAGNOSIS — E039 Hypothyroidism, unspecified: Secondary | ICD-10-CM | POA: Diagnosis not present

## 2018-10-14 DIAGNOSIS — G309 Alzheimer's disease, unspecified: Secondary | ICD-10-CM | POA: Diagnosis not present

## 2018-10-14 DIAGNOSIS — I82409 Acute embolism and thrombosis of unspecified deep veins of unspecified lower extremity: Secondary | ICD-10-CM | POA: Diagnosis not present

## 2018-10-14 DIAGNOSIS — I10 Essential (primary) hypertension: Secondary | ICD-10-CM | POA: Diagnosis not present

## 2018-11-11 DIAGNOSIS — I10 Essential (primary) hypertension: Secondary | ICD-10-CM | POA: Diagnosis not present

## 2018-11-11 DIAGNOSIS — I82409 Acute embolism and thrombosis of unspecified deep veins of unspecified lower extremity: Secondary | ICD-10-CM | POA: Diagnosis not present

## 2018-11-24 DIAGNOSIS — Z20828 Contact with and (suspected) exposure to other viral communicable diseases: Secondary | ICD-10-CM | POA: Diagnosis not present

## 2018-11-30 DIAGNOSIS — Z20828 Contact with and (suspected) exposure to other viral communicable diseases: Secondary | ICD-10-CM | POA: Diagnosis not present

## 2018-12-13 DIAGNOSIS — Z20828 Contact with and (suspected) exposure to other viral communicable diseases: Secondary | ICD-10-CM | POA: Diagnosis not present

## 2018-12-16 DIAGNOSIS — G309 Alzheimer's disease, unspecified: Secondary | ICD-10-CM | POA: Diagnosis not present

## 2018-12-16 DIAGNOSIS — I10 Essential (primary) hypertension: Secondary | ICD-10-CM | POA: Diagnosis not present

## 2018-12-16 DIAGNOSIS — I82409 Acute embolism and thrombosis of unspecified deep veins of unspecified lower extremity: Secondary | ICD-10-CM | POA: Diagnosis not present

## 2018-12-20 DIAGNOSIS — Z20828 Contact with and (suspected) exposure to other viral communicable diseases: Secondary | ICD-10-CM | POA: Diagnosis not present

## 2018-12-27 DIAGNOSIS — Z20828 Contact with and (suspected) exposure to other viral communicable diseases: Secondary | ICD-10-CM | POA: Diagnosis not present

## 2019-01-03 DIAGNOSIS — Z20828 Contact with and (suspected) exposure to other viral communicable diseases: Secondary | ICD-10-CM | POA: Diagnosis not present

## 2019-01-10 DIAGNOSIS — Z20828 Contact with and (suspected) exposure to other viral communicable diseases: Secondary | ICD-10-CM | POA: Diagnosis not present

## 2019-01-15 DIAGNOSIS — Q159 Congenital malformation of eye, unspecified: Secondary | ICD-10-CM | POA: Diagnosis not present

## 2019-01-15 DIAGNOSIS — I82409 Acute embolism and thrombosis of unspecified deep veins of unspecified lower extremity: Secondary | ICD-10-CM | POA: Diagnosis not present

## 2019-01-15 DIAGNOSIS — I10 Essential (primary) hypertension: Secondary | ICD-10-CM | POA: Diagnosis not present

## 2019-01-15 DIAGNOSIS — G309 Alzheimer's disease, unspecified: Secondary | ICD-10-CM | POA: Diagnosis not present

## 2019-01-17 DIAGNOSIS — Z20828 Contact with and (suspected) exposure to other viral communicable diseases: Secondary | ICD-10-CM | POA: Diagnosis not present

## 2019-01-24 DIAGNOSIS — I10 Essential (primary) hypertension: Secondary | ICD-10-CM | POA: Diagnosis not present

## 2019-01-24 DIAGNOSIS — G309 Alzheimer's disease, unspecified: Secondary | ICD-10-CM | POA: Diagnosis not present

## 2019-01-24 DIAGNOSIS — N390 Urinary tract infection, site not specified: Secondary | ICD-10-CM | POA: Diagnosis not present

## 2019-01-24 DIAGNOSIS — I82409 Acute embolism and thrombosis of unspecified deep veins of unspecified lower extremity: Secondary | ICD-10-CM | POA: Diagnosis not present

## 2019-01-24 DIAGNOSIS — R2681 Unsteadiness on feet: Secondary | ICD-10-CM | POA: Diagnosis not present

## 2019-01-24 DIAGNOSIS — Z20828 Contact with and (suspected) exposure to other viral communicable diseases: Secondary | ICD-10-CM | POA: Diagnosis not present

## 2019-01-24 DIAGNOSIS — A419 Sepsis, unspecified organism: Secondary | ICD-10-CM | POA: Diagnosis not present

## 2019-01-24 DIAGNOSIS — R278 Other lack of coordination: Secondary | ICD-10-CM | POA: Diagnosis not present

## 2019-01-24 DIAGNOSIS — R41841 Cognitive communication deficit: Secondary | ICD-10-CM | POA: Diagnosis not present

## 2019-01-25 DIAGNOSIS — N390 Urinary tract infection, site not specified: Secondary | ICD-10-CM | POA: Diagnosis not present

## 2019-01-25 DIAGNOSIS — R2681 Unsteadiness on feet: Secondary | ICD-10-CM | POA: Diagnosis not present

## 2019-01-25 DIAGNOSIS — A419 Sepsis, unspecified organism: Secondary | ICD-10-CM | POA: Diagnosis not present

## 2019-01-25 DIAGNOSIS — R41841 Cognitive communication deficit: Secondary | ICD-10-CM | POA: Diagnosis not present

## 2019-01-25 DIAGNOSIS — I10 Essential (primary) hypertension: Secondary | ICD-10-CM | POA: Diagnosis not present

## 2019-01-25 DIAGNOSIS — R278 Other lack of coordination: Secondary | ICD-10-CM | POA: Diagnosis not present

## 2019-01-26 DIAGNOSIS — A419 Sepsis, unspecified organism: Secondary | ICD-10-CM | POA: Diagnosis not present

## 2019-01-26 DIAGNOSIS — N390 Urinary tract infection, site not specified: Secondary | ICD-10-CM | POA: Diagnosis not present

## 2019-01-26 DIAGNOSIS — R41841 Cognitive communication deficit: Secondary | ICD-10-CM | POA: Diagnosis not present

## 2019-01-26 DIAGNOSIS — R278 Other lack of coordination: Secondary | ICD-10-CM | POA: Diagnosis not present

## 2019-01-26 DIAGNOSIS — R2681 Unsteadiness on feet: Secondary | ICD-10-CM | POA: Diagnosis not present

## 2019-01-26 DIAGNOSIS — I10 Essential (primary) hypertension: Secondary | ICD-10-CM | POA: Diagnosis not present

## 2019-01-27 DIAGNOSIS — A419 Sepsis, unspecified organism: Secondary | ICD-10-CM | POA: Diagnosis not present

## 2019-01-27 DIAGNOSIS — N390 Urinary tract infection, site not specified: Secondary | ICD-10-CM | POA: Diagnosis not present

## 2019-01-27 DIAGNOSIS — I10 Essential (primary) hypertension: Secondary | ICD-10-CM | POA: Diagnosis not present

## 2019-01-27 DIAGNOSIS — R278 Other lack of coordination: Secondary | ICD-10-CM | POA: Diagnosis not present

## 2019-01-27 DIAGNOSIS — R41841 Cognitive communication deficit: Secondary | ICD-10-CM | POA: Diagnosis not present

## 2019-01-27 DIAGNOSIS — R2681 Unsteadiness on feet: Secondary | ICD-10-CM | POA: Diagnosis not present

## 2019-01-30 DIAGNOSIS — N390 Urinary tract infection, site not specified: Secondary | ICD-10-CM | POA: Diagnosis not present

## 2019-01-30 DIAGNOSIS — R278 Other lack of coordination: Secondary | ICD-10-CM | POA: Diagnosis not present

## 2019-01-30 DIAGNOSIS — I10 Essential (primary) hypertension: Secondary | ICD-10-CM | POA: Diagnosis not present

## 2019-01-30 DIAGNOSIS — R41841 Cognitive communication deficit: Secondary | ICD-10-CM | POA: Diagnosis not present

## 2019-01-30 DIAGNOSIS — R2681 Unsteadiness on feet: Secondary | ICD-10-CM | POA: Diagnosis not present

## 2019-01-30 DIAGNOSIS — A419 Sepsis, unspecified organism: Secondary | ICD-10-CM | POA: Diagnosis not present

## 2019-01-31 DIAGNOSIS — R41841 Cognitive communication deficit: Secondary | ICD-10-CM | POA: Diagnosis not present

## 2019-01-31 DIAGNOSIS — R2681 Unsteadiness on feet: Secondary | ICD-10-CM | POA: Diagnosis not present

## 2019-01-31 DIAGNOSIS — I10 Essential (primary) hypertension: Secondary | ICD-10-CM | POA: Diagnosis not present

## 2019-01-31 DIAGNOSIS — N390 Urinary tract infection, site not specified: Secondary | ICD-10-CM | POA: Diagnosis not present

## 2019-01-31 DIAGNOSIS — A419 Sepsis, unspecified organism: Secondary | ICD-10-CM | POA: Diagnosis not present

## 2019-01-31 DIAGNOSIS — R278 Other lack of coordination: Secondary | ICD-10-CM | POA: Diagnosis not present

## 2019-02-01 DIAGNOSIS — R278 Other lack of coordination: Secondary | ICD-10-CM | POA: Diagnosis not present

## 2019-02-01 DIAGNOSIS — I10 Essential (primary) hypertension: Secondary | ICD-10-CM | POA: Diagnosis not present

## 2019-02-01 DIAGNOSIS — N390 Urinary tract infection, site not specified: Secondary | ICD-10-CM | POA: Diagnosis not present

## 2019-02-01 DIAGNOSIS — R2681 Unsteadiness on feet: Secondary | ICD-10-CM | POA: Diagnosis not present

## 2019-02-01 DIAGNOSIS — A419 Sepsis, unspecified organism: Secondary | ICD-10-CM | POA: Diagnosis not present

## 2019-02-01 DIAGNOSIS — R41841 Cognitive communication deficit: Secondary | ICD-10-CM | POA: Diagnosis not present

## 2019-02-02 DIAGNOSIS — I10 Essential (primary) hypertension: Secondary | ICD-10-CM | POA: Diagnosis not present

## 2019-02-02 DIAGNOSIS — R41841 Cognitive communication deficit: Secondary | ICD-10-CM | POA: Diagnosis not present

## 2019-02-02 DIAGNOSIS — R2681 Unsteadiness on feet: Secondary | ICD-10-CM | POA: Diagnosis not present

## 2019-02-02 DIAGNOSIS — N390 Urinary tract infection, site not specified: Secondary | ICD-10-CM | POA: Diagnosis not present

## 2019-02-02 DIAGNOSIS — A419 Sepsis, unspecified organism: Secondary | ICD-10-CM | POA: Diagnosis not present

## 2019-02-02 DIAGNOSIS — R278 Other lack of coordination: Secondary | ICD-10-CM | POA: Diagnosis not present

## 2019-02-03 DIAGNOSIS — R278 Other lack of coordination: Secondary | ICD-10-CM | POA: Diagnosis not present

## 2019-02-03 DIAGNOSIS — R2681 Unsteadiness on feet: Secondary | ICD-10-CM | POA: Diagnosis not present

## 2019-02-03 DIAGNOSIS — R41841 Cognitive communication deficit: Secondary | ICD-10-CM | POA: Diagnosis not present

## 2019-02-03 DIAGNOSIS — I10 Essential (primary) hypertension: Secondary | ICD-10-CM | POA: Diagnosis not present

## 2019-02-03 DIAGNOSIS — N390 Urinary tract infection, site not specified: Secondary | ICD-10-CM | POA: Diagnosis not present

## 2019-02-03 DIAGNOSIS — A419 Sepsis, unspecified organism: Secondary | ICD-10-CM | POA: Diagnosis not present

## 2019-02-06 DIAGNOSIS — A419 Sepsis, unspecified organism: Secondary | ICD-10-CM | POA: Diagnosis not present

## 2019-02-06 DIAGNOSIS — R41841 Cognitive communication deficit: Secondary | ICD-10-CM | POA: Diagnosis not present

## 2019-02-06 DIAGNOSIS — N390 Urinary tract infection, site not specified: Secondary | ICD-10-CM | POA: Diagnosis not present

## 2019-02-06 DIAGNOSIS — I10 Essential (primary) hypertension: Secondary | ICD-10-CM | POA: Diagnosis not present

## 2019-02-06 DIAGNOSIS — R2681 Unsteadiness on feet: Secondary | ICD-10-CM | POA: Diagnosis not present

## 2019-02-06 DIAGNOSIS — R278 Other lack of coordination: Secondary | ICD-10-CM | POA: Diagnosis not present

## 2019-02-07 DIAGNOSIS — A419 Sepsis, unspecified organism: Secondary | ICD-10-CM | POA: Diagnosis not present

## 2019-02-07 DIAGNOSIS — I10 Essential (primary) hypertension: Secondary | ICD-10-CM | POA: Diagnosis not present

## 2019-02-07 DIAGNOSIS — N390 Urinary tract infection, site not specified: Secondary | ICD-10-CM | POA: Diagnosis not present

## 2019-02-07 DIAGNOSIS — R2681 Unsteadiness on feet: Secondary | ICD-10-CM | POA: Diagnosis not present

## 2019-02-07 DIAGNOSIS — R41841 Cognitive communication deficit: Secondary | ICD-10-CM | POA: Diagnosis not present

## 2019-02-07 DIAGNOSIS — R278 Other lack of coordination: Secondary | ICD-10-CM | POA: Diagnosis not present

## 2019-02-08 DIAGNOSIS — A419 Sepsis, unspecified organism: Secondary | ICD-10-CM | POA: Diagnosis not present

## 2019-02-08 DIAGNOSIS — N390 Urinary tract infection, site not specified: Secondary | ICD-10-CM | POA: Diagnosis not present

## 2019-02-08 DIAGNOSIS — R2681 Unsteadiness on feet: Secondary | ICD-10-CM | POA: Diagnosis not present

## 2019-02-08 DIAGNOSIS — R41841 Cognitive communication deficit: Secondary | ICD-10-CM | POA: Diagnosis not present

## 2019-02-08 DIAGNOSIS — R278 Other lack of coordination: Secondary | ICD-10-CM | POA: Diagnosis not present

## 2019-02-08 DIAGNOSIS — I10 Essential (primary) hypertension: Secondary | ICD-10-CM | POA: Diagnosis not present

## 2019-02-09 DIAGNOSIS — R2681 Unsteadiness on feet: Secondary | ICD-10-CM | POA: Diagnosis not present

## 2019-02-09 DIAGNOSIS — I10 Essential (primary) hypertension: Secondary | ICD-10-CM | POA: Diagnosis not present

## 2019-02-09 DIAGNOSIS — N390 Urinary tract infection, site not specified: Secondary | ICD-10-CM | POA: Diagnosis not present

## 2019-02-09 DIAGNOSIS — R278 Other lack of coordination: Secondary | ICD-10-CM | POA: Diagnosis not present

## 2019-02-09 DIAGNOSIS — A419 Sepsis, unspecified organism: Secondary | ICD-10-CM | POA: Diagnosis not present

## 2019-02-09 DIAGNOSIS — R41841 Cognitive communication deficit: Secondary | ICD-10-CM | POA: Diagnosis not present

## 2019-02-10 DIAGNOSIS — N390 Urinary tract infection, site not specified: Secondary | ICD-10-CM | POA: Diagnosis not present

## 2019-02-10 DIAGNOSIS — R2681 Unsteadiness on feet: Secondary | ICD-10-CM | POA: Diagnosis not present

## 2019-02-10 DIAGNOSIS — R41841 Cognitive communication deficit: Secondary | ICD-10-CM | POA: Diagnosis not present

## 2019-02-10 DIAGNOSIS — A419 Sepsis, unspecified organism: Secondary | ICD-10-CM | POA: Diagnosis not present

## 2019-02-10 DIAGNOSIS — R278 Other lack of coordination: Secondary | ICD-10-CM | POA: Diagnosis not present

## 2019-02-10 DIAGNOSIS — I10 Essential (primary) hypertension: Secondary | ICD-10-CM | POA: Diagnosis not present

## 2019-02-13 DIAGNOSIS — N390 Urinary tract infection, site not specified: Secondary | ICD-10-CM | POA: Diagnosis not present

## 2019-02-13 DIAGNOSIS — R278 Other lack of coordination: Secondary | ICD-10-CM | POA: Diagnosis not present

## 2019-02-13 DIAGNOSIS — I10 Essential (primary) hypertension: Secondary | ICD-10-CM | POA: Diagnosis not present

## 2019-02-13 DIAGNOSIS — A419 Sepsis, unspecified organism: Secondary | ICD-10-CM | POA: Diagnosis not present

## 2019-02-13 DIAGNOSIS — R2681 Unsteadiness on feet: Secondary | ICD-10-CM | POA: Diagnosis not present

## 2019-02-13 DIAGNOSIS — R41841 Cognitive communication deficit: Secondary | ICD-10-CM | POA: Diagnosis not present

## 2019-02-14 DIAGNOSIS — R41841 Cognitive communication deficit: Secondary | ICD-10-CM | POA: Diagnosis not present

## 2019-02-14 DIAGNOSIS — R2681 Unsteadiness on feet: Secondary | ICD-10-CM | POA: Diagnosis not present

## 2019-02-14 DIAGNOSIS — R278 Other lack of coordination: Secondary | ICD-10-CM | POA: Diagnosis not present

## 2019-02-14 DIAGNOSIS — I10 Essential (primary) hypertension: Secondary | ICD-10-CM | POA: Diagnosis not present

## 2019-02-14 DIAGNOSIS — A419 Sepsis, unspecified organism: Secondary | ICD-10-CM | POA: Diagnosis not present

## 2019-02-14 DIAGNOSIS — N390 Urinary tract infection, site not specified: Secondary | ICD-10-CM | POA: Diagnosis not present

## 2019-02-15 DIAGNOSIS — I10 Essential (primary) hypertension: Secondary | ICD-10-CM | POA: Diagnosis not present

## 2019-02-15 DIAGNOSIS — R2681 Unsteadiness on feet: Secondary | ICD-10-CM | POA: Diagnosis not present

## 2019-02-15 DIAGNOSIS — R278 Other lack of coordination: Secondary | ICD-10-CM | POA: Diagnosis not present

## 2019-02-15 DIAGNOSIS — R41841 Cognitive communication deficit: Secondary | ICD-10-CM | POA: Diagnosis not present

## 2019-02-15 DIAGNOSIS — N390 Urinary tract infection, site not specified: Secondary | ICD-10-CM | POA: Diagnosis not present

## 2019-02-15 DIAGNOSIS — A419 Sepsis, unspecified organism: Secondary | ICD-10-CM | POA: Diagnosis not present

## 2019-02-16 DIAGNOSIS — R2681 Unsteadiness on feet: Secondary | ICD-10-CM | POA: Diagnosis not present

## 2019-02-16 DIAGNOSIS — R278 Other lack of coordination: Secondary | ICD-10-CM | POA: Diagnosis not present

## 2019-02-16 DIAGNOSIS — I10 Essential (primary) hypertension: Secondary | ICD-10-CM | POA: Diagnosis not present

## 2019-02-16 DIAGNOSIS — R41841 Cognitive communication deficit: Secondary | ICD-10-CM | POA: Diagnosis not present

## 2019-02-16 DIAGNOSIS — A419 Sepsis, unspecified organism: Secondary | ICD-10-CM | POA: Diagnosis not present

## 2019-02-16 DIAGNOSIS — N390 Urinary tract infection, site not specified: Secondary | ICD-10-CM | POA: Diagnosis not present

## 2019-02-19 DIAGNOSIS — I10 Essential (primary) hypertension: Secondary | ICD-10-CM | POA: Diagnosis not present

## 2019-02-19 DIAGNOSIS — N390 Urinary tract infection, site not specified: Secondary | ICD-10-CM | POA: Diagnosis not present

## 2019-02-19 DIAGNOSIS — R278 Other lack of coordination: Secondary | ICD-10-CM | POA: Diagnosis not present

## 2019-02-19 DIAGNOSIS — R41841 Cognitive communication deficit: Secondary | ICD-10-CM | POA: Diagnosis not present

## 2019-02-19 DIAGNOSIS — R2681 Unsteadiness on feet: Secondary | ICD-10-CM | POA: Diagnosis not present

## 2019-02-19 DIAGNOSIS — A419 Sepsis, unspecified organism: Secondary | ICD-10-CM | POA: Diagnosis not present

## 2019-02-20 DIAGNOSIS — R41841 Cognitive communication deficit: Secondary | ICD-10-CM | POA: Diagnosis not present

## 2019-02-20 DIAGNOSIS — A419 Sepsis, unspecified organism: Secondary | ICD-10-CM | POA: Diagnosis not present

## 2019-02-20 DIAGNOSIS — I10 Essential (primary) hypertension: Secondary | ICD-10-CM | POA: Diagnosis not present

## 2019-02-20 DIAGNOSIS — R278 Other lack of coordination: Secondary | ICD-10-CM | POA: Diagnosis not present

## 2019-02-20 DIAGNOSIS — N390 Urinary tract infection, site not specified: Secondary | ICD-10-CM | POA: Diagnosis not present

## 2019-02-20 DIAGNOSIS — E785 Hyperlipidemia, unspecified: Secondary | ICD-10-CM | POA: Diagnosis not present

## 2019-02-20 DIAGNOSIS — R2681 Unsteadiness on feet: Secondary | ICD-10-CM | POA: Diagnosis not present

## 2019-02-20 DIAGNOSIS — D649 Anemia, unspecified: Secondary | ICD-10-CM | POA: Diagnosis not present

## 2019-02-21 DIAGNOSIS — R2681 Unsteadiness on feet: Secondary | ICD-10-CM | POA: Diagnosis not present

## 2019-02-21 DIAGNOSIS — N390 Urinary tract infection, site not specified: Secondary | ICD-10-CM | POA: Diagnosis not present

## 2019-02-21 DIAGNOSIS — R41841 Cognitive communication deficit: Secondary | ICD-10-CM | POA: Diagnosis not present

## 2019-02-21 DIAGNOSIS — R278 Other lack of coordination: Secondary | ICD-10-CM | POA: Diagnosis not present

## 2019-02-21 DIAGNOSIS — I10 Essential (primary) hypertension: Secondary | ICD-10-CM | POA: Diagnosis not present

## 2019-02-21 DIAGNOSIS — A419 Sepsis, unspecified organism: Secondary | ICD-10-CM | POA: Diagnosis not present

## 2019-02-22 DIAGNOSIS — N390 Urinary tract infection, site not specified: Secondary | ICD-10-CM | POA: Diagnosis not present

## 2019-02-22 DIAGNOSIS — A419 Sepsis, unspecified organism: Secondary | ICD-10-CM | POA: Diagnosis not present

## 2019-02-22 DIAGNOSIS — R278 Other lack of coordination: Secondary | ICD-10-CM | POA: Diagnosis not present

## 2019-02-22 DIAGNOSIS — I10 Essential (primary) hypertension: Secondary | ICD-10-CM | POA: Diagnosis not present

## 2019-02-22 DIAGNOSIS — R41841 Cognitive communication deficit: Secondary | ICD-10-CM | POA: Diagnosis not present

## 2019-02-22 DIAGNOSIS — R2681 Unsteadiness on feet: Secondary | ICD-10-CM | POA: Diagnosis not present

## 2019-02-27 DIAGNOSIS — Z20828 Contact with and (suspected) exposure to other viral communicable diseases: Secondary | ICD-10-CM | POA: Diagnosis not present

## 2019-03-01 DIAGNOSIS — Z23 Encounter for immunization: Secondary | ICD-10-CM | POA: Diagnosis not present

## 2019-03-03 DIAGNOSIS — Q159 Congenital malformation of eye, unspecified: Secondary | ICD-10-CM | POA: Diagnosis not present

## 2019-03-03 DIAGNOSIS — G309 Alzheimer's disease, unspecified: Secondary | ICD-10-CM | POA: Diagnosis not present

## 2019-03-03 DIAGNOSIS — I10 Essential (primary) hypertension: Secondary | ICD-10-CM | POA: Diagnosis not present

## 2019-03-03 DIAGNOSIS — I82409 Acute embolism and thrombosis of unspecified deep veins of unspecified lower extremity: Secondary | ICD-10-CM | POA: Diagnosis not present

## 2019-03-15 ENCOUNTER — Inpatient Hospital Stay (HOSPITAL_COMMUNITY)
Admission: EM | Admit: 2019-03-15 | Discharge: 2019-03-18 | DRG: 871 | Disposition: A | Discharge status: Skilled Nursing Facility | Payer: MEDICARE | Attending: Internal Medicine | Admitting: Internal Medicine

## 2019-03-15 ENCOUNTER — Emergency Department (HOSPITAL_COMMUNITY): Payer: MEDICARE

## 2019-03-15 ENCOUNTER — Encounter (HOSPITAL_COMMUNITY): Payer: Self-pay | Admitting: Emergency Medicine

## 2019-03-15 ENCOUNTER — Observation Stay (HOSPITAL_COMMUNITY): Payer: MEDICARE

## 2019-03-15 DIAGNOSIS — F05 Delirium due to known physiological condition: Secondary | ICD-10-CM | POA: Diagnosis not present

## 2019-03-15 DIAGNOSIS — E870 Hyperosmolality and hypernatremia: Secondary | ICD-10-CM

## 2019-03-15 DIAGNOSIS — Z8249 Family history of ischemic heart disease and other diseases of the circulatory system: Secondary | ICD-10-CM

## 2019-03-15 DIAGNOSIS — A4189 Other specified sepsis: Secondary | ICD-10-CM | POA: Diagnosis not present

## 2019-03-15 DIAGNOSIS — A419 Sepsis, unspecified organism: Secondary | ICD-10-CM | POA: Diagnosis not present

## 2019-03-15 DIAGNOSIS — J45909 Unspecified asthma, uncomplicated: Secondary | ICD-10-CM | POA: Diagnosis present

## 2019-03-15 DIAGNOSIS — Z820 Family history of epilepsy and other diseases of the nervous system: Secondary | ICD-10-CM

## 2019-03-15 DIAGNOSIS — Z79899 Other long term (current) drug therapy: Secondary | ICD-10-CM

## 2019-03-15 DIAGNOSIS — Z6822 Body mass index (BMI) 22.0-22.9, adult: Secondary | ICD-10-CM

## 2019-03-15 DIAGNOSIS — N179 Acute kidney failure, unspecified: Secondary | ICD-10-CM

## 2019-03-15 DIAGNOSIS — R4182 Altered mental status, unspecified: Secondary | ICD-10-CM

## 2019-03-15 DIAGNOSIS — I959 Hypotension, unspecified: Secondary | ICD-10-CM | POA: Diagnosis not present

## 2019-03-15 DIAGNOSIS — U071 COVID-19: Secondary | ICD-10-CM | POA: Diagnosis not present

## 2019-03-15 DIAGNOSIS — F329 Major depressive disorder, single episode, unspecified: Secondary | ICD-10-CM | POA: Diagnosis present

## 2019-03-15 DIAGNOSIS — F0280 Dementia in other diseases classified elsewhere without behavioral disturbance: Secondary | ICD-10-CM | POA: Diagnosis present

## 2019-03-15 DIAGNOSIS — I9589 Other hypotension: Secondary | ICD-10-CM | POA: Diagnosis present

## 2019-03-15 DIAGNOSIS — R6520 Severe sepsis without septic shock: Secondary | ICD-10-CM | POA: Diagnosis present

## 2019-03-15 DIAGNOSIS — E860 Dehydration: Secondary | ICD-10-CM | POA: Diagnosis not present

## 2019-03-15 DIAGNOSIS — R4020 Unspecified coma: Secondary | ICD-10-CM | POA: Diagnosis not present

## 2019-03-15 DIAGNOSIS — G9341 Metabolic encephalopathy: Secondary | ICD-10-CM | POA: Diagnosis present

## 2019-03-15 DIAGNOSIS — G309 Alzheimer's disease, unspecified: Secondary | ICD-10-CM | POA: Diagnosis present

## 2019-03-15 DIAGNOSIS — R0600 Dyspnea, unspecified: Secondary | ICD-10-CM

## 2019-03-15 DIAGNOSIS — Z86718 Personal history of other venous thrombosis and embolism: Secondary | ICD-10-CM

## 2019-03-15 DIAGNOSIS — Z66 Do not resuscitate: Secondary | ICD-10-CM | POA: Diagnosis present

## 2019-03-15 DIAGNOSIS — R6521 Severe sepsis with septic shock: Secondary | ICD-10-CM | POA: Diagnosis not present

## 2019-03-15 DIAGNOSIS — I10 Essential (primary) hypertension: Secondary | ICD-10-CM | POA: Diagnosis present

## 2019-03-15 DIAGNOSIS — R636 Underweight: Secondary | ICD-10-CM | POA: Diagnosis present

## 2019-03-15 DIAGNOSIS — Z7901 Long term (current) use of anticoagulants: Secondary | ICD-10-CM

## 2019-03-15 DIAGNOSIS — R1312 Dysphagia, oropharyngeal phase: Secondary | ICD-10-CM | POA: Diagnosis present

## 2019-03-15 DIAGNOSIS — R0902 Hypoxemia: Secondary | ICD-10-CM | POA: Diagnosis not present

## 2019-03-15 DIAGNOSIS — R404 Transient alteration of awareness: Secondary | ICD-10-CM | POA: Diagnosis not present

## 2019-03-15 LAB — TYPE AND SCREEN
ABO/RH(D): O POS
Antibody Screen: NEGATIVE

## 2019-03-15 LAB — URINALYSIS, ROUTINE W REFLEX MICROSCOPIC
Glucose, UA: NEGATIVE mg/dL
Hgb urine dipstick: NEGATIVE
Ketones, ur: NEGATIVE mg/dL
Leukocytes,Ua: NEGATIVE
Nitrite: NEGATIVE
Protein, ur: 100 mg/dL — AB
Specific Gravity, Urine: 1.03 (ref 1.005–1.030)
pH: 5 (ref 5.0–8.0)

## 2019-03-15 LAB — CBC WITH DIFFERENTIAL/PLATELET
Abs Immature Granulocytes: 0.07 10*3/uL (ref 0.00–0.07)
Basophils Absolute: 0 10*3/uL (ref 0.0–0.1)
Basophils Relative: 0 %
Eosinophils Absolute: 0.1 10*3/uL (ref 0.0–0.5)
Eosinophils Relative: 0 %
HCT: 44.2 % (ref 36.0–46.0)
Hemoglobin: 13 g/dL (ref 12.0–15.0)
Immature Granulocytes: 1 %
Lymphocytes Relative: 18 %
Lymphs Abs: 2.3 10*3/uL (ref 0.7–4.0)
MCH: 25.6 pg — ABNORMAL LOW (ref 26.0–34.0)
MCHC: 29.4 g/dL — ABNORMAL LOW (ref 30.0–36.0)
MCV: 87 fL (ref 80.0–100.0)
Monocytes Absolute: 0.6 10*3/uL (ref 0.1–1.0)
Monocytes Relative: 5 %
Neutro Abs: 9.9 10*3/uL — ABNORMAL HIGH (ref 1.7–7.7)
Neutrophils Relative %: 76 %
Platelets: 212 10*3/uL (ref 150–400)
RBC: 5.08 MIL/uL (ref 3.87–5.11)
RDW: 17.3 % — ABNORMAL HIGH (ref 11.5–15.5)
WBC: 13.1 10*3/uL — ABNORMAL HIGH (ref 4.0–10.5)
nRBC: 0 % (ref 0.0–0.2)

## 2019-03-15 LAB — POCT I-STAT EG7
Acid-Base Excess: 5 mmol/L — ABNORMAL HIGH (ref 0.0–2.0)
Bicarbonate: 28.2 mmol/L — ABNORMAL HIGH (ref 20.0–28.0)
Calcium, Ion: 1.23 mmol/L (ref 1.15–1.40)
HCT: 40 % (ref 36.0–46.0)
Hemoglobin: 13.6 g/dL (ref 12.0–15.0)
O2 Saturation: 99 %
Potassium: 3.1 mmol/L — ABNORMAL LOW (ref 3.5–5.1)
Sodium: 159 mmol/L — ABNORMAL HIGH (ref 135–145)
TCO2: 29 mmol/L (ref 22–32)
pCO2, Ven: 38 mmHg — ABNORMAL LOW (ref 44.0–60.0)
pH, Ven: 7.479 — ABNORMAL HIGH (ref 7.250–7.430)
pO2, Ven: 148 mmHg — ABNORMAL HIGH (ref 32.0–45.0)

## 2019-03-15 LAB — COMPREHENSIVE METABOLIC PANEL
ALT: 242 U/L — ABNORMAL HIGH (ref 0–44)
AST: 157 U/L — ABNORMAL HIGH (ref 15–41)
Albumin: 3.1 g/dL — ABNORMAL LOW (ref 3.5–5.0)
Alkaline Phosphatase: 79 U/L (ref 38–126)
Anion gap: 13 (ref 5–15)
BUN: 53 mg/dL — ABNORMAL HIGH (ref 8–23)
CO2: 27 mmol/L (ref 22–32)
Calcium: 9.5 mg/dL (ref 8.9–10.3)
Chloride: 117 mmol/L — ABNORMAL HIGH (ref 98–111)
Creatinine, Ser: 1.68 mg/dL — ABNORMAL HIGH (ref 0.44–1.00)
GFR calc Af Amer: 32 mL/min — ABNORMAL LOW (ref >60)
GFR calc non Af Amer: 28 mL/min — ABNORMAL LOW (ref >60)
Glucose, Bld: 169 mg/dL — ABNORMAL HIGH (ref 70–99)
Potassium: 3.4 mmol/L — ABNORMAL LOW (ref 3.5–5.1)
Sodium: 157 mmol/L — ABNORMAL HIGH (ref 135–145)
Total Bilirubin: 1.5 mg/dL — ABNORMAL HIGH (ref 0.3–1.2)
Total Protein: 6.4 g/dL — ABNORMAL LOW (ref 6.5–8.1)

## 2019-03-15 LAB — PROTIME-INR
INR: 1.4 — ABNORMAL HIGH (ref 0.8–1.2)
Prothrombin Time: 17.2 seconds — ABNORMAL HIGH (ref 11.4–15.2)

## 2019-03-15 LAB — CBG MONITORING, ED
Glucose-Capillary: 124 mg/dL — ABNORMAL HIGH (ref 70–99)
Glucose-Capillary: 93 mg/dL (ref 70–99)

## 2019-03-15 LAB — RESPIRATORY PANEL BY RT PCR (FLU A&B, COVID)
Influenza A by PCR: NEGATIVE
Influenza B by PCR: NEGATIVE
SARS Coronavirus 2 by RT PCR: POSITIVE — AB

## 2019-03-15 LAB — LACTIC ACID, PLASMA
Lactic Acid, Venous: 2.8 mmol/L (ref 0.5–1.9)
Lactic Acid, Venous: 6.3 mmol/L (ref 0.5–1.9)

## 2019-03-15 LAB — AMMONIA: Ammonia: 32 umol/L (ref 9–35)

## 2019-03-15 LAB — APTT: aPTT: 37 seconds — ABNORMAL HIGH (ref 24–36)

## 2019-03-15 LAB — POC OCCULT BLOOD, ED: Fecal Occult Bld: NEGATIVE

## 2019-03-15 MED ORDER — LACTATED RINGERS IV BOLUS (SEPSIS)
1000.0000 mL | Freq: Once | INTRAVENOUS | Status: AC
  Administered 2019-03-15: 1000 mL via INTRAVENOUS

## 2019-03-15 MED ORDER — ONDANSETRON HCL 4 MG/2ML IJ SOLN: 4.0000 mg | Freq: Four times a day (QID) | INTRAMUSCULAR | Status: DC | PRN

## 2019-03-15 MED ORDER — ONDANSETRON HCL 4 MG PO TABS: 4.0000 mg | ORAL_TABLET | Freq: Four times a day (QID) | ORAL | Status: DC | PRN

## 2019-03-15 MED ORDER — DONEPEZIL HCL 23 MG PO TABS
23.0000 mg | ORAL_TABLET | Freq: Every day | ORAL | Status: DC
  Administered 2019-03-16 – 2019-03-17 (×3): 23 mg via ORAL
  Filled 2019-03-15 (×4): qty 1

## 2019-03-15 MED ORDER — VANCOMYCIN HCL IN DEXTROSE 1-5 GM/200ML-% IV SOLN: 1000.0000 mg | Freq: Once | INTRAVENOUS | Status: DC

## 2019-03-15 MED ORDER — SODIUM CHLORIDE 0.9 % IV SOLN
2.0000 g | Freq: Once | INTRAVENOUS | Status: AC
  Administered 2019-03-15: 2 g via INTRAVENOUS
  Filled 2019-03-15: qty 2

## 2019-03-15 MED ORDER — ACETAMINOPHEN 325 MG PO TABS: 650.0000 mg | ORAL_TABLET | Freq: Four times a day (QID) | ORAL | Status: DC | PRN

## 2019-03-15 MED ORDER — ZINC SULFATE 220 (50 ZN) MG PO CAPS
220.0000 mg | ORAL_CAPSULE | Freq: Every day | ORAL | Status: DC
  Administered 2019-03-16 – 2019-03-18 (×3): 220 mg via ORAL
  Filled 2019-03-15 (×3): qty 1

## 2019-03-15 MED ORDER — DEXTROSE 5 % IV SOLN: INTRAVENOUS | Status: AC

## 2019-03-15 MED ORDER — LACTATED RINGERS IV BOLUS (SEPSIS)
1000.0000 mL | Freq: Once | INTRAVENOUS | Status: AC
  Administered 2019-03-15: 15:00:00 1000 mL via INTRAVENOUS

## 2019-03-15 MED ORDER — ACETAMINOPHEN 650 MG RE SUPP: 650.0000 mg | Freq: Four times a day (QID) | RECTAL | Status: DC | PRN

## 2019-03-15 MED ORDER — LACTATED RINGERS IV SOLN: INTRAVENOUS | Status: DC

## 2019-03-15 MED ORDER — VANCOMYCIN HCL 1250 MG/250ML IV SOLN
1250.0000 mg | Freq: Once | INTRAVENOUS | Status: AC
  Administered 2019-03-15: 1250 mg via INTRAVENOUS
  Filled 2019-03-15: qty 250

## 2019-03-15 MED ORDER — SODIUM CHLORIDE 0.9% FLUSH
3.0000 mL | Freq: Two times a day (BID) | INTRAVENOUS | Status: DC
  Administered 2019-03-16 – 2019-03-18 (×5): 3 mL via INTRAVENOUS

## 2019-03-15 MED ORDER — METRONIDAZOLE IN NACL 5-0.79 MG/ML-% IV SOLN
500.0000 mg | Freq: Once | INTRAVENOUS | Status: AC
  Administered 2019-03-15: 17:00:00 500 mg via INTRAVENOUS
  Filled 2019-03-15: qty 100

## 2019-03-15 MED ORDER — ASCORBIC ACID 500 MG PO TABS
1000.0000 mg | ORAL_TABLET | Freq: Every day | ORAL | Status: DC
  Administered 2019-03-16 – 2019-03-18 (×4): 1000 mg via ORAL
  Filled 2019-03-15 (×4): qty 2

## 2019-03-15 NOTE — ED Triage Notes (Signed)
Pt her from nursing home that was covid positive 2 weeks ago , pt had an episode of unresponsiveness  Arrives to the Ed awake , cbg 24 , pt does have alzheimer's

## 2019-03-15 NOTE — ED Notes (Signed)
This tech and RN attempted x3 but was unable to get blood. About to call phlebotomy.

## 2019-03-15 NOTE — ED Provider Notes (Signed)
[facility] EMERGENCY DEPARTMENT Provider Note   CSN: [id number] Arrival date & time: 03/15/19  1450     History No chief complaint on file.   Courtney Day is a 85 y.o. female.  Patient is an 85 year old female with a history of dementia, hypertension, DVT on Xarelto prior sepsis and asthma who is presenting today from nursing facility due to altered mental status and hypotension.  Upon arrival here patient is not able to answer any questions and is minimally responsive.  Blood sugar was noted to be normal per EMS but pressures in the 80s per their report.  Patient recently had been diagnosed with Covid and was finishing her quarantine but it has been almost 2 weeks.  She had not been requiring significant amounts of oxygen and seemed to be improving from Covid.  Patient is noted to be a DNR.  The history is provided by the nursing home, the EMS personnel and medical records. The history is limited by the condition of the patient.       Past Medical History:  Diagnosis Date  . Allergy   . Asthma    > probably all pseudoasthma, -D/C ACE March 30,2010 > pseudowheeze resolved,cough resolved SPN RLL  . Dementia ([place]) 2010   Dr. Krista Blue  . Depression   . Hypertension   . Knee pain    Bilateral  . Low back pain   . Osteoarthritis   . Vitiligo 2010    Patient Active Problem List   Diagnosis Date Noted  . AKI (acute kidney injury) ([place])   . Sepsis secondary to UTI ([place]) 07/23/2018  . Rash and nonspecific skin eruption 07/06/2018  . DVT (deep venous thrombosis) ([place]) 07/05/2018  . Well adult exam 05/18/2013  . Right-sided chest wall pain 09/01/2010  . BREAST PAIN 03/18/2010  . Alzheimer's disease ([place]) 11/26/2009  . Vitiligo 11/26/2009  . PULMONARY NODULE 05/22/2008  . HYPOKALEMIA 05/14/2008  . VERTIGO 05/14/2008  . ALLERGIC RHINITIS 04/27/2008  . Asthma 04/27/2008  . UPPER RESPIRATORY INFECTION (URI) 03/26/2008  . Nonspecific (abnormal) findings on  radiological and other examination of body structure 03/26/2008  . CHEST XRAY, ABNORMAL 03/26/2008  . SINUSITIS, ACUTE 02/28/2008  . Osteoarthritis 07/06/2007  . SHOULDER PAIN 07/06/2007  . LEG PAIN 05/03/2007  . Situational depression 12/24/2006  . Essential hypertension 12/24/2006  . LOW BACK PAIN 12/24/2006    Past Surgical History:  Procedure Laterality Date  . ABDOMINAL HYSTERECTOMY    . hystoplasmosis lung biopsy- 2010 Dr. Fransico Him       OB History   No obstetric history on file.     Family History  Problem Relation Age of Onset  . Cancer Mother   . Cancer Sister        smoker  . Multiple sclerosis Son   . Hypertension Other     Social History   Tobacco Use  . Smoking status: Never Smoker  . Smokeless tobacco: Never Used  Substance Use Topics  . Alcohol use: No  . Drug use: No    Home Medications Prior to Admission medications   Medication Sig Start Date End Date Taking? Authorizing Provider  acetaminophen (TYLENOL) 325 MG tablet Take 2 tablets (650 mg total) by mouth every 6 (six) hours as needed for mild pain (or Fever >/= 101). 07/26/18   Barton Dubois, MD  donepezil (ARICEPT) 23 MG TABS tablet Take 1 tablet (23 mg total) by mouth at bedtime. 06/14/17   Garvin Fila, MD  Elastic Bandages & Supports (KNEE COMPRESSION SLEEVE/S/M) MISC 1 each by Does not apply route daily. Put on left leg daily and take off at night 07/06/18   Pincus Sanes, MD  hydrALAZINE (APRESOLINE) 10 MG tablet Take 1 tablet (10 mg total) by mouth 2 (two) times a day. 07/26/18 07/26/19  Vassie Loll, MD  rivaroxaban (XARELTO) 20 MG TABS tablet Take 1 tablet (20 mg total) by mouth daily with supper. 07/26/18   Vassie Loll, MD  triamcinolone cream (KENALOG) 0.5 % Apply 1 application topically 3 (three) times daily. 03/07/18   Plotnikov, Georgina Quint, MD  Vitamin D, Ergocalciferol, (DRISDOL) 1.25 MG (50000 UT) CAPS capsule Take 1 capsule (50,000 Units total) by mouth every 30 (thirty) days.  08/23/18   Vassie Loll, MD    Allergies    Ace inhibitors, Amlodipine, Amlodipine besylate-valsartan, Namenda [memantine hcl], and Valsartan  Review of Systems   Review of Systems  Unable to perform ROS: Mental status change    Physical Exam Updated Vital Signs BP (!) 153/85   Pulse 77   Resp 17   SpO2 98%   Physical Exam Vitals and nursing note reviewed.  Constitutional:      General: She is in acute distress.     Appearance: She is well-developed and underweight. She is toxic-appearing.  HENT:     Head: Normocephalic and atraumatic.     Mouth/Throat:     Mouth: Mucous membranes are dry.  Eyes:     Comments: Conjunctive is pale and pupils are 2 mm and sluggishly reactive  Cardiovascular:     Rate and Rhythm: Normal rate and regular rhythm.     Heart sounds: Normal heart sounds. No murmur. No friction rub.     Comments: Thready radial pulses palpated.  2+ femoral pulse bilaterally Pulmonary:     Effort: Pulmonary effort is normal.     Comments: Coarse upper airway sounds with shallow breathing Abdominal:     General: Bowel sounds are normal. There is no distension.     Palpations: Abdomen is soft.     Tenderness: There is no abdominal tenderness. There is no guarding or rebound.  Musculoskeletal:        General: No tenderness. Normal range of motion.     Right lower leg: No edema.     Left lower leg: No edema.     Comments: No edema  Skin:    General: Skin is warm and dry.     Findings: No rash.  Neurological:     Mental Status: She is lethargic.     Comments: Patient is not following any commands and is only minimally responsive to pain.  Will occasionally blink when opening her eyelids.  Minimal withdrawal from pain.  Does not have any notable facial droops and does move all extremities spontaneously  Psychiatric:     Comments: unresponsive     ED Results / Procedures / Treatments   Labs (all labs ordered are listed, but only abnormal results are  displayed) Labs Reviewed  LACTIC ACID, PLASMA - Abnormal; Notable for the following components:      Result Value   Lactic Acid, Venous 2.8 (*)    All other components within normal limits  COMPREHENSIVE METABOLIC PANEL - Abnormal; Notable for the following components:   Sodium 157 (*)    Potassium 3.4 (*)    Chloride 117 (*)    Glucose, Bld 169 (*)    BUN 53 (*)    Creatinine, Ser 1.68 (*)  Total Protein 6.4 (*)    Albumin 3.1 (*)    AST 157 (*)    ALT 242 (*)    Total Bilirubin 1.5 (*)    GFR calc non Af Amer 28 (*)    GFR calc Af Amer 32 (*)    All other components within normal limits  CBC WITH DIFFERENTIAL/PLATELET - Abnormal; Notable for the following components:   WBC 13.1 (*)    MCH 25.6 (*)    MCHC 29.4 (*)    RDW 17.3 (*)    Neutro Abs 9.9 (*)    All other components within normal limits  APTT - Abnormal; Notable for the following components:   aPTT 37 (*)    All other components within normal limits  PROTIME-INR - Abnormal; Notable for the following components:   Prothrombin Time 17.2 (*)    INR 1.4 (*)    All other components within normal limits  CBG MONITORING, ED - Abnormal; Notable for the following components:   Glucose-Capillary 124 (*)    All other components within normal limits  POCT I-STAT EG7 - Abnormal; Notable for the following components:   pH, Ven 7.479 (*)    pCO2, Ven 38.0 (*)    pO2, Ven 148.0 (*)    Bicarbonate 28.2 (*)    Acid-Base Excess 5.0 (*)    Sodium 159 (*)    Potassium 3.1 (*)    All other components within normal limits  CULTURE, BLOOD (ROUTINE X 2)  CULTURE, BLOOD (ROUTINE X 2)  URINE CULTURE  RESPIRATORY PANEL BY RT PCR (FLU A&B, COVID)  AMMONIA  LACTIC ACID, PLASMA  URINALYSIS, ROUTINE W REFLEX MICROSCOPIC  POC OCCULT BLOOD, ED  TYPE AND SCREEN    EKG EKG Interpretation  Date/Time:  Wednesday March 15 2019 17:17:11 EST Ventricular Rate:  92 PR Interval:    QRS Duration: 117 QT Interval:  440 QTC  Calculation: 545 R Axis:   17 Text Interpretation: Sinus rhythm Atrial premature complexes Nonspecific intraventricular conduction delay Borderline ST depression, anterolateral leads No significant change since last tracing Confirmed by Gwyneth Sprout ([id number]) on 03/15/2019 5:25:02 PM   Radiology DG Chest Port 1 View  Result Date: 03/15/2019 CLINICAL DATA:  Altered mental status. EXAM: PORTABLE CHEST 1 VIEW COMPARISON:  06/26/2018 FINDINGS: Lungs are adequately inflated and otherwise clear. Postsurgical change over the right hilar region and right mid to lower lung. Cardiomediastinal silhouette and remainder of the exam is unchanged. IMPRESSION: No active disease. Electronically Signed   By: Elberta Fortis M.D.   On: 03/15/2019 15:39    Procedures Procedures (including critical care time)  Medications Ordered in ED Medications  lactated ringers bolus 1,000 mL (1,000 mLs Intravenous New Bag/Given 03/15/19 1520)    And  lactated ringers bolus 1,000 mL (has no administration in time range)  ceFEPIme (MAXIPIME) 2 g in sodium chloride 0.9 % 100 mL IVPB (2 g Intravenous New Bag/Given 03/15/19 1606)  metroNIDAZOLE (FLAGYL) IVPB 500 mg (has no administration in time range)  vancomycin (VANCOREADY) IVPB 1250 mg/250 mL (has no administration in time range)    ED Course  I have reviewed the triage vital signs and the nursing notes.  Pertinent labs & imaging results that were available during my care of the patient were reviewed by me and considered in my medical decision making (see chart for details).    MDM Rules/Calculators/A&P  Elderly female presenting today with altered mental status, unresponsive and hypotensive.  Patient's initial blood pressure was in the 50s systolic and patient was not responsive except to painful stimuli.  Code sepsis was initiated and patient was covered with broad-spectrum antibiotics.  Patient has known Covid and has been recovering and is  almost out of quarantine.  She is not having any breathing difficulty and oxygen saturation remains in the 90s.  She takes no narcotic medications or any mind altering medications that would cause her symptoms.  Concern for a pulmonary, urinary source.  Also patient is on Xarelto and will rule out head bleed.  However I would expect hypertension with that.  Patient was started on 30/kg of fluid with good response now blood pressure is in the 130s.  Hemoccult is negative.  ABG without hypercarbia and pH is slightly alkalotic.  CMP is consistent with hypernatremia and new AKI with a creatinine of 1.6 from her baseline of 0.8.  White count of 13,000 and stable hemoglobin.  Lactate is elevated at 2.8 and CBG is within normal limits.  Chest x-ray with no active disease and head CT is pending.  Now patient is looking around moving all of her extremities and blood pressure is improved.  She will need to be admitted for sepsis.  Severe dehydration, hypernatremia and AKI.  ekg without acute changes.  CRITICAL CARE Performed by: Dekisha Mesmer Total critical care time: 30 minutes Critical care time was exclusive of separately billable procedures and treating other patients. Critical care was necessary to treat or prevent imminent or life-threatening deterioration. Critical care was time spent personally by me on the following activities: development of treatment plan with patient and/or surrogate as well as nursing, discussions with consultants, evaluation of patient's response to treatment, examination of patient, obtaining history from patient or surrogate, ordering and performing treatments and interventions, ordering and review of laboratory studies, ordering and review of radiographic studies, pulse oximetry and re-evaluation of patient's condition.    final Clinical Impression(s) / ED Diagnoses Final diagnoses:  AKI (acute kidney injury) (HCC)  Hypernatremia  Altered mental status, unspecified altered  mental status type  Hypotension, unspecified hypotension type  Sepsis with acute renal failure and septic shock, due to unspecified organism, unspecified acute renal failure type [facility])    Rx / DC Orders ED Discharge Orders    None       Gwyneth Sprout, MD 03/15/19 [phone]

## 2019-03-15 NOTE — H&P (Signed)
History and Physical    Courtney Day WER:[id number] DOB: 1935/01/30 DOA: 03/15/2019  PCP: Cassandria Anger, MD  Patient coming from: [place]  I have personally briefly reviewed patient's old medical records in [place]  Chief Complaint: Unresponsive, low blood pressure  HPI: Courtney Day is a 85 y.o. female with medical history significant for Alzheimer's dementia, asthma, history of DVT on Xarelto who presents to the ED from her nursing facility for evaluation after she was found to be unresponsive with hypotension. Unable to obtain history from patient therefore entire history is supplemented by EDP, chart review, and husband by phone.    Husband states he has had limited interaction with his wife since she has been in the facility during the COVID-19 pandemic.  He says the last time he saw her she was able to call out his name but was otherwise not conversant.  Per ED documentation patient had an episode of unresponsiveness at her facility and was noted to be hypotensive.  She reportedly been diagnosed with COVID-19 viral infection 2 weeks ago.  Husband states that she did receive the first dose of the [place] vaccine on 03/01/2019 which was the same day she ended up testing positive.  She has been in quarantine and seems to not have been having many upper respiratory symptoms or oxygen requirement.  ED Course:  Initial vitals showed BP 64/47, pulse 74, RR 22, temp 98.3 Fahrenheit, SPO2 98% on room air.  Labs are notable for sodium 157, potassium 3.4, chloride 117, bicarb 27, BUN 53, creatinine 1.68, serum glucose 169, AST 157, ALT 242, alk phos 79, total bilirubin 1.5, WBC 13.1, hemoglobin 13.0, platelets 212,000, ammonia 32, lactic acid 2.8 > 6.3.  Urinalysis shows negative nitrites, negative leukocytes, 0-5 RBCs, 6-10 WBCs, few bacteria on microscopy.  Blood and urine cultures were obtained and pending.  Respiratory PCR panel was obtained and  pending.  Portable chest x-ray was negative for focal consolidation, edema, or effusion.  CT head without contrast was ordered and pending.  Patient was initially treated as code sepsis and started on IV vancomycin, cefepime, and Flagyl.  She was given 2 L LR and started on maintenance IV fluids.  The hospitalist service was consulted to admit for further evaluation and management.  Review of Systems:  Unable to obtain full review of systems due to patient's underlying dementia.   Past Medical History:  Diagnosis Date  . Allergy   . Asthma    > probably all pseudoasthma, -D/C ACE March 30,2010 > pseudowheeze resolved,cough resolved SPN RLL  . Dementia ([place]) 2010   Dr. Krista Blue  . Depression   . Hypertension   . Knee pain    Bilateral  . Low back pain   . Osteoarthritis   . Vitiligo 2010    Past Surgical History:  Procedure Laterality Date  . ABDOMINAL HYSTERECTOMY    . hystoplasmosis lung biopsy- 2010 Dr. Fransico Him      Social History:  reports that she has never smoked. She has never used smokeless tobacco. She reports that she does not drink alcohol or use drugs.  Allergies  Allergen Reactions  . Ace Inhibitors Cough  . Amlodipine Other (See Comments)    dizziness  . Amlodipine Besylate-Valsartan Other (See Comments)    dizziness  . Namenda [Memantine Hcl] Other (See Comments)    Unknown reaction  . Valsartan Other (See Comments)    dizziness    Family History  Problem Relation Age of  Onset  . Cancer Mother   . Cancer Sister        smoker  . Multiple sclerosis Son   . Hypertension Other      Prior to Admission medications   Medication Sig Start Date End Date Taking? Authorizing Provider  acetaminophen (TYLENOL) 325 MG tablet Take 2 tablets (650 mg total) by mouth every 6 (six) hours as needed for mild pain (or Fever >/= 101). 07/26/18   Barton Dubois, MD  donepezil (ARICEPT) 23 MG TABS tablet Take 1 tablet (23 mg total) by mouth at bedtime. 06/14/17   Garvin Fila, MD  Elastic Bandages & Supports (KNEE COMPRESSION SLEEVE/S/M) MISC 1 each by Does not apply route daily. Put on left leg daily and take off at night 07/06/18   Binnie Rail, MD  hydrALAZINE (APRESOLINE) 10 MG tablet Take 1 tablet (10 mg total) by mouth 2 (two) times a day. 07/26/18 07/26/19  Barton Dubois, MD  rivaroxaban (XARELTO) 20 MG TABS tablet Take 1 tablet (20 mg total) by mouth daily with supper. 07/26/18   Barton Dubois, MD  triamcinolone cream (KENALOG) 0.5 % Apply 1 application topically 3 (three) times daily. 03/07/18   Plotnikov, Evie Lacks, MD  Vitamin D, Ergocalciferol, (DRISDOL) 1.25 MG (50000 UT) CAPS capsule Take 1 capsule (50,000 Units total) by mouth every 30 (thirty) days. 08/23/18   Barton Dubois, MD    Physical Exam: Vitals:   03/15/19 1930 03/15/19 1945 03/15/19 2022 03/15/19 2115  BP: (!) 104/58 (!) 153/88  136/74  Pulse:   99 97  Resp: (!) 21 (!) 28 (!) 22 19  Temp:      TempSrc:      SpO2:   100% 97%   Constitutional: Elderly woman resting supine in bed, NAD, calm, comfortable, not communicative Eyes: PERRL, lids and conjunctivae normal ENMT: Mucous membranes are dry. Posterior pharynx clear of any exudate or lesions. Neck: normal, supple, no masses. Respiratory: clear to auscultation anteriorly. Normal respiratory effort. No accessory muscle use.  Cardiovascular: Regular rate and rhythm, no murmurs / rubs / gallops. No extremity edema. 2+ pedal pulses. Abdomen: no tenderness, no masses palpated. No hepatosplenomegaly. Bowel sounds positive.  Musculoskeletal: no clubbing / cyanosis. No joint deformity upper and lower extremities. Skin: Hypopigmented patch dorsum of right hand. No induration Neurologic: Exam limited due to dementia, cranial nerves II-XII appear intact.  Sensation appears intact.  Moving all extremities spontaneously but not to command. Psychiatric: Awake and alert but not communicative or following commands.   Labs on Admission: I have  personally reviewed following labs and imaging studies  CBC: Recent Labs  Lab 03/15/19 1530 03/15/19 1545  WBC 13.1*  --   NEUTROABS 9.9*  --   HGB 13.0 13.6  HCT 44.2 40.0  MCV 87.0  --   PLT 212  --    Basic Metabolic Panel: Recent Labs  Lab 03/15/19 1530 03/15/19 1545  NA 157* 159*  K 3.4* 3.1*  CL 117*  --   CO2 27  --   GLUCOSE 169*  --   BUN 53*  --   CREATININE 1.68*  --   CALCIUM 9.5  --    GFR: CrCl cannot be calculated (Unknown ideal weight.). Liver Function Tests: Recent Labs  Lab 03/15/19 1530  AST 157*  ALT 242*  ALKPHOS 79  BILITOT 1.5*  PROT 6.4*  ALBUMIN 3.1*   No results for input(s): LIPASE, AMYLASE in the last 168 hours. Recent Labs  Lab 03/15/19  1530  AMMONIA 32   Coagulation Profile: Recent Labs  Lab 03/15/19 1530  INR 1.4*   Cardiac Enzymes: No results for input(s): CKTOTAL, CKMB, CKMBINDEX, TROPONINI in the last 168 hours. BNP (last 3 results) No results for input(s): PROBNP in the last 8760 hours. HbA1C: No results for input(s): HGBA1C in the last 72 hours. CBG: Recent Labs  Lab 03/15/19 1454 03/15/19 2031  GLUCAP 124* 93   Lipid Profile: No results for input(s): CHOL, HDL, LDLCALC, TRIG, CHOLHDL, LDLDIRECT in the last 72 hours. Thyroid Function Tests: No results for input(s): TSH, T4TOTAL, FREET4, T3FREE, THYROIDAB in the last 72 hours. Anemia Panel: No results for input(s): VITAMINB12, FOLATE, FERRITIN, TIBC, IRON, RETICCTPCT in the last 72 hours. Urine analysis:    Component Value Date/Time   COLORURINE AMBER (A) 03/15/2019 1751   APPEARANCEUR CLOUDY (A) 03/15/2019 1751   LABSPEC 1.030 03/15/2019 1751   PHURINE 5.0 03/15/2019 1751   GLUCOSEU NEGATIVE 03/15/2019 1751   GLUCOSEU NEGATIVE 04/14/2016 0952   HGBUR NEGATIVE 03/15/2019 1751   BILIRUBINUR SMALL (A) 03/15/2019 1751   KETONESUR NEGATIVE 03/15/2019 1751   PROTEINUR 100 (A) 03/15/2019 1751   UROBILINOGEN 0.2 04/14/2016 0952   NITRITE NEGATIVE  03/15/2019 1751   LEUKOCYTESUR NEGATIVE 03/15/2019 1751    Radiological Exams on Admission: DG Chest Port 1 View  Result Date: 03/15/2019 CLINICAL DATA:  Altered mental status. EXAM: PORTABLE CHEST 1 VIEW COMPARISON:  06/26/2018 FINDINGS: Lungs are adequately inflated and otherwise clear. Postsurgical change over the right hilar region and right mid to lower lung. Cardiomediastinal silhouette and remainder of the exam is unchanged. IMPRESSION: No active disease. Electronically Signed   By: Marin Olp M.D.   On: 03/15/2019 15:39    EKG: Independently reviewed. Sinus rhythm with PAC, motion artifact.  Right faster and PACs new when compared to prior.  Assessment/Plan Principal Problem:   Severe dehydration Active Problems:   Alzheimer's disease ([place])   AKI (acute kidney injury) ([place])   Hypotension   Hypernatremia  Courtney Day is a 85 y.o. female with medical history significant for Alzheimer's dementia, asthma, history of DVT on Xarelto who is admitted with dehydration resulting in hypernatremia and acute kidney injury.  Hypernatremia/dehydration: Likely from progressive decreased oral intake possibly exacerbated in setting of COVID-19 viral infection.  She remains dehydrated on admission with initial 3.6 L free water deficit. -Continue IV fluid resuscitation overnight with D5W @ 125/hr -Repeat labs in a.m.  Hypotension: Secondary to significant dehydration, now stabilized with initial IV fluid resuscitation.  Continue IV fluids overnight as above.  Hold hydralazine.  Acute kidney injury: Prerenal secondary to dehydration.  Continue IV fluids overnight and recheck labs in a.m.  COVID-19 viral infection: Reported positive test as an outpatient approximately 2 weeks ago at her nursing facility.  Husband states she did receive the first dose of [place] vaccination on 03/01/2019.  Currently not having respiratory symptoms from the standpoint, chest x-ray is clear, and she is  saturating well on room air.  Continue vitamin C and zinc, supplemental oxygen as needed.  History of DVT: Patient's husband states that she is now off Xarelto but unsure if this has been continued at her nursing facility.  Will continue to hold Xarelto pending CT head.  Alzheimer's dementia with associated delirium/encephalopathy: Delirium superimposed on known Alzheimer's dementia in the setting of significant dehydration.  Continue IV fluids as above and resume donepezil.  DVT prophylaxis: SCDs Code Status: DNR, confirmed with husband by phone Family Communication: Discussed  with husband Landen [place] by phone [phone] Disposition Plan: Likely discharge to prior facility pending clinical progress Consults called: None Admission status: Observation   Zada Finders MD [facility]  If 7PM-7AM, please contact night-coverage [email or web address]  03/15/2019, 9:44 PM

## 2019-03-15 NOTE — ED Notes (Signed)
EDP and phlebotomy made aware of unsuccessful lab draw attempt

## 2019-03-16 DIAGNOSIS — R2689 Other abnormalities of gait and mobility: Secondary | ICD-10-CM | POA: Diagnosis not present

## 2019-03-16 DIAGNOSIS — R1312 Dysphagia, oropharyngeal phase: Secondary | ICD-10-CM | POA: Diagnosis present

## 2019-03-16 DIAGNOSIS — J45909 Unspecified asthma, uncomplicated: Secondary | ICD-10-CM | POA: Diagnosis present

## 2019-03-16 DIAGNOSIS — E860 Dehydration: Secondary | ICD-10-CM

## 2019-03-16 DIAGNOSIS — E46 Unspecified protein-calorie malnutrition: Secondary | ICD-10-CM | POA: Diagnosis not present

## 2019-03-16 DIAGNOSIS — I9589 Other hypotension: Secondary | ICD-10-CM | POA: Diagnosis present

## 2019-03-16 DIAGNOSIS — A4189 Other specified sepsis: Secondary | ICD-10-CM | POA: Diagnosis present

## 2019-03-16 DIAGNOSIS — R531 Weakness: Secondary | ICD-10-CM | POA: Diagnosis not present

## 2019-03-16 DIAGNOSIS — Z8249 Family history of ischemic heart disease and other diseases of the circulatory system: Secondary | ICD-10-CM | POA: Diagnosis not present

## 2019-03-16 DIAGNOSIS — F05 Delirium due to known physiological condition: Secondary | ICD-10-CM | POA: Diagnosis present

## 2019-03-16 DIAGNOSIS — G9341 Metabolic encephalopathy: Secondary | ICD-10-CM | POA: Diagnosis present

## 2019-03-16 DIAGNOSIS — R279 Unspecified lack of coordination: Secondary | ICD-10-CM | POA: Diagnosis not present

## 2019-03-16 DIAGNOSIS — R6520 Severe sepsis without septic shock: Secondary | ICD-10-CM | POA: Diagnosis present

## 2019-03-16 DIAGNOSIS — R0600 Dyspnea, unspecified: Secondary | ICD-10-CM | POA: Diagnosis not present

## 2019-03-16 DIAGNOSIS — A419 Sepsis, unspecified organism: Secondary | ICD-10-CM | POA: Diagnosis not present

## 2019-03-16 DIAGNOSIS — R636 Underweight: Secondary | ICD-10-CM | POA: Diagnosis present

## 2019-03-16 DIAGNOSIS — F0280 Dementia in other diseases classified elsewhere without behavioral disturbance: Secondary | ICD-10-CM | POA: Diagnosis present

## 2019-03-16 DIAGNOSIS — M2550 Pain in unspecified joint: Secondary | ICD-10-CM | POA: Diagnosis not present

## 2019-03-16 DIAGNOSIS — F329 Major depressive disorder, single episode, unspecified: Secondary | ICD-10-CM | POA: Diagnosis present

## 2019-03-16 DIAGNOSIS — I82409 Acute embolism and thrombosis of unspecified deep veins of unspecified lower extremity: Secondary | ICD-10-CM | POA: Diagnosis not present

## 2019-03-16 DIAGNOSIS — M1990 Unspecified osteoarthritis, unspecified site: Secondary | ICD-10-CM | POA: Diagnosis not present

## 2019-03-16 DIAGNOSIS — I10 Essential (primary) hypertension: Secondary | ICD-10-CM | POA: Diagnosis present

## 2019-03-16 DIAGNOSIS — Z6822 Body mass index (BMI) 22.0-22.9, adult: Secondary | ICD-10-CM | POA: Diagnosis not present

## 2019-03-16 DIAGNOSIS — M6281 Muscle weakness (generalized): Secondary | ICD-10-CM | POA: Diagnosis not present

## 2019-03-16 DIAGNOSIS — Z79899 Other long term (current) drug therapy: Secondary | ICD-10-CM | POA: Diagnosis not present

## 2019-03-16 DIAGNOSIS — R2681 Unsteadiness on feet: Secondary | ICD-10-CM | POA: Diagnosis not present

## 2019-03-16 DIAGNOSIS — Z7901 Long term (current) use of anticoagulants: Secondary | ICD-10-CM | POA: Diagnosis not present

## 2019-03-16 DIAGNOSIS — E870 Hyperosmolality and hypernatremia: Secondary | ICD-10-CM | POA: Diagnosis present

## 2019-03-16 DIAGNOSIS — Z820 Family history of epilepsy and other diseases of the nervous system: Secondary | ICD-10-CM | POA: Diagnosis not present

## 2019-03-16 DIAGNOSIS — N178 Other acute kidney failure: Secondary | ICD-10-CM | POA: Diagnosis not present

## 2019-03-16 DIAGNOSIS — U071 COVID-19: Secondary | ICD-10-CM | POA: Diagnosis not present

## 2019-03-16 DIAGNOSIS — G309 Alzheimer's disease, unspecified: Secondary | ICD-10-CM | POA: Diagnosis present

## 2019-03-16 DIAGNOSIS — Z7401 Bed confinement status: Secondary | ICD-10-CM | POA: Diagnosis not present

## 2019-03-16 DIAGNOSIS — N179 Acute kidney failure, unspecified: Secondary | ICD-10-CM | POA: Diagnosis present

## 2019-03-16 DIAGNOSIS — Z86718 Personal history of other venous thrombosis and embolism: Secondary | ICD-10-CM | POA: Diagnosis not present

## 2019-03-16 DIAGNOSIS — Z66 Do not resuscitate: Secondary | ICD-10-CM | POA: Diagnosis present

## 2019-03-16 LAB — CBC WITH DIFFERENTIAL/PLATELET
Abs Immature Granulocytes: 0.04 10*3/uL (ref 0.00–0.07)
Basophils Absolute: 0.1 10*3/uL (ref 0.0–0.1)
Basophils Relative: 0 %
Eosinophils Absolute: 0.2 10*3/uL (ref 0.0–0.5)
Eosinophils Relative: 1 %
HCT: 37 % (ref 36.0–46.0)
Hemoglobin: 11.4 g/dL — ABNORMAL LOW (ref 12.0–15.0)
Immature Granulocytes: 0 %
Lymphocytes Relative: 14 %
Lymphs Abs: 2 10*3/uL (ref 0.7–4.0)
MCH: 26 pg (ref 26.0–34.0)
MCHC: 30.8 g/dL (ref 30.0–36.0)
MCV: 84.5 fL (ref 80.0–100.0)
Monocytes Absolute: 0.9 10*3/uL (ref 0.1–1.0)
Monocytes Relative: 6 %
Neutro Abs: 10.7 10*3/uL — ABNORMAL HIGH (ref 1.7–7.7)
Neutrophils Relative %: 79 %
Platelets: 127 10*3/uL — ABNORMAL LOW (ref 150–400)
RBC: 4.38 MIL/uL (ref 3.87–5.11)
RDW: 17.1 % — ABNORMAL HIGH (ref 11.5–15.5)
WBC: 13.8 10*3/uL — ABNORMAL HIGH (ref 4.0–10.5)
nRBC: 0 % (ref 0.0–0.2)

## 2019-03-16 LAB — COMPREHENSIVE METABOLIC PANEL
ALT: 167 U/L — ABNORMAL HIGH (ref 0–44)
AST: 77 U/L — ABNORMAL HIGH (ref 15–41)
Albumin: 2.7 g/dL — ABNORMAL LOW (ref 3.5–5.0)
Alkaline Phosphatase: 71 U/L (ref 38–126)
Anion gap: 8 (ref 5–15)
BUN: 35 mg/dL — ABNORMAL HIGH (ref 8–23)
CO2: 27 mmol/L (ref 22–32)
Calcium: 8.9 mg/dL (ref 8.9–10.3)
Chloride: 112 mmol/L — ABNORMAL HIGH (ref 98–111)
Creatinine, Ser: 1.03 mg/dL — ABNORMAL HIGH (ref 0.44–1.00)
GFR calc Af Amer: 59 mL/min — ABNORMAL LOW (ref >60)
GFR calc non Af Amer: 51 mL/min — ABNORMAL LOW (ref >60)
Glucose, Bld: 118 mg/dL — ABNORMAL HIGH (ref 70–99)
Potassium: 3.2 mmol/L — ABNORMAL LOW (ref 3.5–5.1)
Sodium: 147 mmol/L — ABNORMAL HIGH (ref 135–145)
Total Bilirubin: 1.8 mg/dL — ABNORMAL HIGH (ref 0.3–1.2)
Total Protein: 5.7 g/dL — ABNORMAL LOW (ref 6.5–8.1)

## 2019-03-16 LAB — GLUCOSE, CAPILLARY: Glucose-Capillary: 107 mg/dL — ABNORMAL HIGH (ref 70–99)

## 2019-03-16 LAB — D-DIMER, QUANTITATIVE: D-Dimer, Quant: 0.52 ug/mL-FEU — ABNORMAL HIGH (ref 0.00–0.50)

## 2019-03-16 LAB — FERRITIN: Ferritin: 300 ng/mL (ref 11–307)

## 2019-03-16 LAB — MAGNESIUM: Magnesium: 2 mg/dL (ref 1.7–2.4)

## 2019-03-16 LAB — CBG MONITORING, ED: Glucose-Capillary: 115 mg/dL — ABNORMAL HIGH (ref 70–99)

## 2019-03-16 LAB — MRSA PCR SCREENING: MRSA by PCR: NEGATIVE

## 2019-03-16 LAB — C-REACTIVE PROTEIN: CRP: 9.7 mg/dL — ABNORMAL HIGH (ref <1.0)

## 2019-03-16 LAB — PHOSPHORUS: Phosphorus: 2.6 mg/dL (ref 2.5–4.6)

## 2019-03-16 MED ORDER — ENOXAPARIN SODIUM 60 MG/0.6ML ~~LOC~~ SOLN
1.0000 mg/kg | Freq: Two times a day (BID) | SUBCUTANEOUS | Status: DC
  Administered 2019-03-16 – 2019-03-17 (×2): 55 mg via SUBCUTANEOUS
  Filled 2019-03-16 (×2): qty 0.6

## 2019-03-16 MED ORDER — POTASSIUM CHLORIDE 10 MEQ/100ML IV SOLN
10.0000 meq | INTRAVENOUS | Status: AC
  Administered 2019-03-16 (×3): 10 meq via INTRAVENOUS
  Filled 2019-03-16 (×3): qty 100

## 2019-03-16 MED ORDER — SODIUM CHLORIDE 0.9 % IV SOLN: INTRAVENOUS | Status: DC

## 2019-03-16 NOTE — Progress Notes (Signed)
PROGRESS NOTE    Courtney Day  TDV:[id number] DOB: [birth year] DOA: 03/15/2019 PCP: Cassandria Anger, MD    Brief Narrative:  Per GGY:IRSW [name] is a 85 y.o. female with medical history significant for Alzheimer's dementia, asthma, history of DVT on Xarelto who presents to the ED from her nursing facility for evaluation after she was found to be unresponsive with hypotension. Unable to obtain history from patient therefore entire history is supplemented by EDP, chart review, and husband by phone.    Husband states he has had limited interaction with his wife since she has been in the facility during the COVID-19 pandemic.  He says the last time he saw her she was able to call out his name but was otherwise not conversant.  Per ED documentation patient had an episode of unresponsiveness at her facility and was noted to be hypotensive.  She reportedly been diagnosed with COVID-19 viral infection 2 weeks ago.  Husband states that she did receive the first dose of the [place] vaccine on 03/01/2019 which was the same day she ended up testing positive.  She has been in quarantine and seems to not have been having many upper respiratory symptoms or oxygen requirement.  In the ED she was found to have hypotension with BP as low as 64/74, stable O2 sats on RA, no fever, and was lethargic. CXR was negative for consolidation. CT head was negative for acute changes. She was started on broad spectrum antibiotics.    Assessment & Plan:   Principal Problem:   Severe dehydration Active Problems:   Alzheimer's disease ([place])   AKI (acute kidney injury) ([place])   Hypotension   Hypernatremia   Acute metabolic encephalopathy  Hypernatremia/dehydration: Likely from progressive decreased oral intake possibly exacerbated in setting of COVID-19 viral infection.  She was dehydrated on presentation with initial 3.6 L free water deficit. -Continue IV fluid resuscitation, decrease rate due to sodium correction.    -Repeat labs in a.m.  Hypotension: Secondary to significant dehydration, now stabilized with initial IV fluid resuscitation.  Continue IV fluids. Hold hydralazine.  Acute kidney injury: Prerenal secondary to dehydration.  Continue IV fluids and recheck labs in a.m.  COVID-19 viral infection: Reported positive test as an outpatient approximately 2 weeks ago at her nursing facility.  Husband states she did receive the first dose of [place] vaccination on 03/01/2019.  Currently not having respiratory symptoms from the standpoint, chest x-ray is clear, and she is saturating well on room air.  Continue vitamin C and zinc, supplemental oxygen as needed. CRP elevated.  -Trend inflammatory markers.    History of DVT: Patient's husband states that she is now off Xarelto but unsure if this has been continued at her nursing facility.   -Start Lovenox while mentation waxes and wanes, resume Xarelto once able to tolerate po medications better.   Alzheimer's dementia with associated delirium/encephalopathy: Delirium superimposed on known Alzheimer's dementia in the setting of significant dehydration.  Continue IV fluids as above and resume donepezil once tolerating po meds.  DVT prophylaxis: Lovenox Code Status: DNR, confirmed with husband by phone on presentation.  Family Communication: Will attempt to contact husband again tomorrow.  Disposition Plan: Likely discharge to prior facility pending clinical progress Consults called: None Admission status: Changed to IP with anticipated at least 2MN stay.  Procedures:  None Antimicrobials:  None  Subjective: Patient calm, no focal deficits, confused, not following commands, nonverbal.   Objective: Vitals:   03/16/19 0341 03/16/19 0400 03/16/19 1000  03/16/19 1503  BP:  (!) 136/101 (!) 147/89 (!) 120/99  Pulse:   (!) 118   Resp:  17  15  Temp: 99.4 F (37.4 C) 98 F (36.7 C) 98.1 F (36.7 C) 97.9 F (36.6 C)  TempSrc: Rectal Axillary  Axillary Axillary  SpO2:  100%  100%  Weight:      Height:        Intake/Output Summary (Last 24 hours) at 03/16/2019 1850 Last data filed at 03/16/2019 0520 Gross per 24 hour  Intake 2022.24 ml  Output --  Net 2022.24 ml   Filed Weights   03/16/19 0046  Weight: 54.4 kg    Examination:  General exam: Appears calm and comfortable  Respiratory system:  Respiratory effort normal. No wheezing.  Cardiovascular system: S1 & S2 present, mild tachycardia. No JVD Gastrointestinal system: Abdomen is nondistended, soft and nontender.  Central nervous system: Alert, makes eye contact, nonverbal, not following commands but able to move ext x4. No facial droop. Marland Kitchen Extremities: Symmetric 5 x 5 power. Skin: No rashes, lesions or ulcers Psychiatry: Guarded affect.     Data Reviewed: I have personally reviewed following labs and imaging studies  CBC: Recent Labs  Lab 03/15/19 1530 03/15/19 1545 03/16/19 1052  WBC 13.1*  --  13.8*  NEUTROABS 9.9*  --  10.7*  HGB 13.0 13.6 11.4*  HCT 44.2 40.0 37.0  MCV 87.0  --  84.5  PLT 212  --  127*   Basic Metabolic Panel: Recent Labs  Lab 03/15/19 1530 03/15/19 1545 03/16/19 1052  NA 157* 159* 147*  K 3.4* 3.1* 3.2*  CL 117*  --  112*  CO2 27  --  27  GLUCOSE 169*  --  118*  BUN 53*  --  35*  CREATININE 1.68*  --  1.03*  CALCIUM 9.5  --  8.9  MG  --   --  2.0  PHOS  --   --  2.6   GFR: Estimated Creatinine Clearance: 34.8 mL/min (A) (by C-G formula based on SCr of 1.03 mg/dL (H)). Liver Function Tests: Recent Labs  Lab 03/15/19 1530 03/16/19 1052  AST 157* 77*  ALT 242* 167*  ALKPHOS 79 71  BILITOT 1.5* 1.8*  PROT 6.4* 5.7*  ALBUMIN 3.1* 2.7*   No results for input(s): LIPASE, AMYLASE in the last 168 hours. Recent Labs  Lab 03/15/19 1530  AMMONIA 32   Coagulation Profile: Recent Labs  Lab 03/15/19 1530  INR 1.4*   Cardiac Enzymes: No results for input(s): CKTOTAL, CKMB, CKMBINDEX, TROPONINI in the last 168  hours. BNP (last 3 results) No results for input(s): PROBNP in the last 8760 hours. HbA1C: No results for input(s): HGBA1C in the last 72 hours. CBG: Recent Labs  Lab 03/15/19 1454 03/15/19 2031 03/16/19 0326 03/16/19 0539  GLUCAP 124* 93 115* 107*   Lipid Profile: No results for input(s): CHOL, HDL, LDLCALC, TRIG, CHOLHDL, LDLDIRECT in the last 72 hours. Thyroid Function Tests: No results for input(s): TSH, T4TOTAL, FREET4, T3FREE, THYROIDAB in the last 72 hours. Anemia Panel: Recent Labs    03/16/19 1052  FERRITIN 300   Sepsis Labs: Recent Labs  Lab 03/15/19 1516 03/15/19 1845  LATICACIDVEN 2.8* 6.3*    Recent Results (from the past 240 hour(s))  Blood Culture (routine x 2)     Status: None (Preliminary result)   Collection Time: 03/15/19  3:30 PM   Specimen: BLOOD  Result Value Ref Range Status   Specimen Description BLOOD RIGHT  FEMORAL ARTERY  Final   Special Requests   Final    BOTTLES DRAWN AEROBIC ONLY Blood Culture results may not be optimal due to an inadequate volume of blood received in culture bottles   Culture   Final    NO GROWTH < 24 HOURS Performed at [facility], [address] [place], [place] [address]    Report Status PENDING  Incomplete  Blood Culture (routine x 2)     Status: None (Preliminary result)   Collection Time: 03/15/19  6:45 PM   Specimen: BLOOD LEFT HAND  Result Value Ref Range Status   Specimen Description BLOOD LEFT HAND  Final   Special Requests   Final    BOTTLES DRAWN AEROBIC AND ANAEROBIC Blood Culture results may not be optimal due to an inadequate volume of blood received in culture bottles   Culture   Final    NO GROWTH < 12 HOURS Performed at [facility], [address], [place], [place] [address]    Report Status PENDING  Incomplete  Respiratory Panel by RT PCR (Flu A&B, Covid) - Nasopharyngeal Swab     Status: Abnormal   Collection Time: 03/15/19  7:16 PM   Specimen: Nasopharyngeal Swab  Result  Value Ref Range Status   SARS Coronavirus 2 by RT PCR POSITIVE (A) NEGATIVE Final    Comment: RESULT CALLED TO, READ BACK BY AND VERIFIED WITH: B OLDBLAND RN 03/15/19 2054 JDW (NOTE) SARS-CoV-2 target nucleic acids are DETECTED. SARS-CoV-2 RNA is generally detectable in upper respiratory specimens  during the acute phase of infection. Positive results are indicative of the presence of the identified virus, but do not rule out bacterial infection or co-infection with other pathogens not detected by the test. Clinical correlation with patient history and other diagnostic information is necessary to determine patient infection status. The expected result is Negative. Fact Sheet for Patients:  https://www.moore.com/ Fact Sheet for Healthcare Providers: https://www.young.biz/ This test is not yet approved or cleared by the [place] FDA and  has been authorized for detection and/or diagnosis of SARS-CoV-2 by FDA under an Emergency Use Authorization (EUA).  This EUA will remain in effect (meaning this test can be used) for th e duration of  the COVID-19 declaration under Section 564(b)(1) of the Act, 21 U.S.C. section 360bbb-3(b)(1), unless the authorization is terminated or revoked sooner.    Influenza A by PCR NEGATIVE NEGATIVE Final   Influenza B by PCR NEGATIVE NEGATIVE Final    Comment: (NOTE) The Xpert Xpress SARS-CoV-2/FLU/RSV assay is intended as an aid in  the diagnosis of influenza from Nasopharyngeal swab specimens and  should not be used as a sole basis for treatment. Nasal washings and  aspirates are unacceptable for Xpert Xpress SARS-CoV-2/FLU/RSV  testing. Fact Sheet for Patients: https://www.moore.com/ Fact Sheet for Healthcare Providers: https://www.young.biz/ This test is not yet approved or cleared by the [place] FDA and  has been authorized for detection and/or diagnosis of  SARS-CoV-2 by  FDA under an Emergency Use Authorization (EUA). This EUA will remain  in effect (meaning this test can be used) for the duration of the  Covid-19 declaration under Section 564(b)(1) of the Act, 21  U.S.C. section 360bbb-3(b)(1), unless the authorization is  terminated or revoked. Performed at [facility], [address] [place], [place] [address]   MRSA PCR Screening     Status: None   Collection Time: 03/16/19  4:27 AM   Specimen: Nasopharyngeal  Result Value  Ref Range Status   MRSA by PCR NEGATIVE NEGATIVE Final    Comment:        The GeneXpert MRSA Assay (FDA approved for NASAL specimens only), is one component of a comprehensive MRSA colonization surveillance program. It is not intended to diagnose MRSA infection nor to guide or monitor treatment for MRSA infections. Performed at [facility], [address] [place], [place] [address]          Radiology Studies: CT Head Wo Contrast  Result Date: 03/15/2019 CLINICAL DATA:  85 year old female with altered mental status. EXAM: CT HEAD WITHOUT CONTRAST TECHNIQUE: Contiguous axial images were obtained from the base of the skull through the vertex without intravenous contrast. COMPARISON:  Head CT dated 07/02/2018. FINDINGS: Evaluation of this exam is limited due to motion artifact. Brain: There is moderate age-related atrophy and chronic microvascular ischemic changes. There is no acute intracranial hemorrhage. No mass effect or midline shift. No extra-axial fluid collection. Vascular: No hyperdense vessel or unexpected calcification. Skull: Normal. Negative for fracture or focal lesion. Sinuses/Orbits: No acute finding. Other: None IMPRESSION: 1. No acute intracranial hemorrhage. 2. Age-related atrophy and chronic microvascular ischemic changes. Electronically Signed   By: Elgie Collard M.D.   On: 03/15/2019 23:33   DG Chest Port 1 View  Result Date: 03/15/2019 CLINICAL DATA:  Altered mental  status. EXAM: PORTABLE CHEST 1 VIEW COMPARISON:  06/26/2018 FINDINGS: Lungs are adequately inflated and otherwise clear. Postsurgical change over the right hilar region and right mid to lower lung. Cardiomediastinal silhouette and remainder of the exam is unchanged. IMPRESSION: No active disease. Electronically Signed   By: Elberta Fortis M.D.   On: 03/15/2019 15:39        Scheduled Meds: . vitamin C  1,000 mg Oral Daily  . donepezil  23 mg Oral QHS  . enoxaparin (LOVENOX) injection  1 mg/kg Subcutaneous Q12H  . sodium chloride flush  3 mL Intravenous Q12H  . zinc sulfate  220 mg Oral Daily   Continuous Infusions: . potassium chloride 10 mEq (03/16/19 1815)     LOS: 0 days    Time spent: 35 minutes    Ky Barban, MD [facility]   If 7PM-7AM, please contact night-coverage [email or web address] Password [facility] 03/16/2019, 6:50 PM

## 2019-03-17 ENCOUNTER — Inpatient Hospital Stay (HOSPITAL_COMMUNITY): Payer: MEDICARE

## 2019-03-17 LAB — COMPREHENSIVE METABOLIC PANEL
ALT: 114 U/L — ABNORMAL HIGH (ref 0–44)
AST: 46 U/L — ABNORMAL HIGH (ref 15–41)
Albumin: 2.7 g/dL — ABNORMAL LOW (ref 3.5–5.0)
Alkaline Phosphatase: 62 U/L (ref 38–126)
Anion gap: 9 (ref 5–15)
BUN: 14 mg/dL (ref 8–23)
CO2: 26 mmol/L (ref 22–32)
Calcium: 8.7 mg/dL — ABNORMAL LOW (ref 8.9–10.3)
Chloride: 111 mmol/L (ref 98–111)
Creatinine, Ser: 0.83 mg/dL (ref 0.44–1.00)
GFR calc Af Amer: >60 mL/min (ref >60)
GFR calc non Af Amer: >60 mL/min (ref >60)
Glucose, Bld: 98 mg/dL (ref 70–99)
Potassium: 3.9 mmol/L (ref 3.5–5.1)
Sodium: 146 mmol/L — ABNORMAL HIGH (ref 135–145)
Total Bilirubin: 1.4 mg/dL — ABNORMAL HIGH (ref 0.3–1.2)
Total Protein: 5.9 g/dL — ABNORMAL LOW (ref 6.5–8.1)

## 2019-03-17 LAB — CBC WITH DIFFERENTIAL/PLATELET
Abs Immature Granulocytes: 0.03 10*3/uL (ref 0.00–0.07)
Basophils Absolute: 0 10*3/uL (ref 0.0–0.1)
Basophils Relative: 0 %
Eosinophils Absolute: 0.2 10*3/uL (ref 0.0–0.5)
Eosinophils Relative: 2 %
HCT: 37.9 % (ref 36.0–46.0)
Hemoglobin: 11.6 g/dL — ABNORMAL LOW (ref 12.0–15.0)
Immature Granulocytes: 0 %
Lymphocytes Relative: 22 %
Lymphs Abs: 1.9 10*3/uL (ref 0.7–4.0)
MCH: 25.6 pg — ABNORMAL LOW (ref 26.0–34.0)
MCHC: 30.6 g/dL (ref 30.0–36.0)
MCV: 83.7 fL (ref 80.0–100.0)
Monocytes Absolute: 0.6 10*3/uL (ref 0.1–1.0)
Monocytes Relative: 6 %
Neutro Abs: 6.1 10*3/uL (ref 1.7–7.7)
Neutrophils Relative %: 70 %
Platelets: 116 10*3/uL — ABNORMAL LOW (ref 150–400)
RBC: 4.53 MIL/uL (ref 3.87–5.11)
RDW: 16.5 % — ABNORMAL HIGH (ref 11.5–15.5)
WBC: 8.8 10*3/uL (ref 4.0–10.5)
nRBC: 0 % (ref 0.0–0.2)

## 2019-03-17 LAB — MAGNESIUM: Magnesium: 1.7 mg/dL (ref 1.7–2.4)

## 2019-03-17 LAB — FERRITIN: Ferritin: 311 ng/mL — ABNORMAL HIGH (ref 11–307)

## 2019-03-17 LAB — URINE CULTURE: Culture: NO GROWTH

## 2019-03-17 LAB — D-DIMER, QUANTITATIVE: D-Dimer, Quant: 0.52 ug/mL-FEU — ABNORMAL HIGH (ref 0.00–0.50)

## 2019-03-17 LAB — PATHOLOGIST SMEAR REVIEW

## 2019-03-17 LAB — PHOSPHORUS: Phosphorus: 1.9 mg/dL — ABNORMAL LOW (ref 2.5–4.6)

## 2019-03-17 LAB — C-REACTIVE PROTEIN: CRP: 11.4 mg/dL — ABNORMAL HIGH (ref <1.0)

## 2019-03-17 MED ORDER — RIVAROXABAN 20 MG PO TABS
20.0000 mg | ORAL_TABLET | Freq: Every day | ORAL | Status: DC
  Administered 2019-03-18: 20 mg via ORAL
  Filled 2019-03-17: qty 1

## 2019-03-17 MED ORDER — ENOXAPARIN SODIUM 60 MG/0.6ML ~~LOC~~ SOLN: 1.0000 mg/kg | SUBCUTANEOUS | Status: DC

## 2019-03-17 MED ORDER — POTASSIUM PHOSPHATES 15 MMOLE/5ML IV SOLN
20.0000 mmol | Freq: Once | INTRAVENOUS | Status: DC
  Filled 2019-03-17: qty 6.67

## 2019-03-17 MED ORDER — POTASSIUM CHLORIDE 10 MEQ/100ML IV SOLN
10.0000 meq | Freq: Once | INTRAVENOUS | Status: AC
  Administered 2019-03-17: 10 meq via INTRAVENOUS
  Filled 2019-03-17: qty 100

## 2019-03-17 MED ORDER — MAGNESIUM SULFATE 2 GM/50ML IV SOLN
2.0000 g | Freq: Once | INTRAVENOUS | Status: DC
  Filled 2019-03-17: qty 50

## 2019-03-17 MED ORDER — ENOXAPARIN SODIUM 30 MG/0.3ML ~~LOC~~ SOLN
30.0000 mg | SUBCUTANEOUS | Status: AC
  Administered 2019-03-18: 30 mg via SUBCUTANEOUS
  Filled 2019-03-17: qty 0.3

## 2019-03-17 NOTE — Progress Notes (Signed)
PROGRESS NOTE    Courtney Day  IZT:[id number] DOB: 1934-11-14 DOA: 03/15/2019 PCP: Tresa Garter, MD    Brief Narrative:  Per JAS:NKNL Courtney Day is a 85 y.o. female with medical history significant for Alzheimer's dementia, asthma, history of DVT on Xarelto who presents to the ED from her nursing facility for evaluation after she was found to be unresponsive with hypotension. Unable to obtain history from patient therefore entire history is supplemented by EDP, chart review, and husband by phone.    Husband states he has had limited interaction with his wife since she has been in the facility during the COVID-19 pandemic.  He says the last time he saw her she was able to call out his name but was otherwise not conversant.  Per ED documentation patient had an episode of unresponsiveness at her facility and was noted to be hypotensive.  She reportedly been diagnosed with COVID-19 viral infection 2 weeks ago.  Husband states that she did receive the first dose of the Maderna vaccine on 03/01/2019 which was the same day she ended up testing positive.  She has been in quarantine and seems to not have been having many upper respiratory symptoms or oxygen requirement.  In the ED she was found to have hypotension with BP as low as 64/74, stable O2 sats on RA, no fever, and was lethargic. CXR was negative for consolidation. CT head was negative for acute changes. She was started on broad spectrum antibiotics.    Assessment & Plan:   Principal Problem:   Severe dehydration Active Problems:   Alzheimer's disease (HCC)   AKI (acute kidney injury) (HCC)   Hypotension   Hypernatremia   Acute metabolic encephalopathy  Hypernatremia/dehydration: Likely from progressive decreased oral intake possibly exacerbated in setting of COVID-19 viral infection.  She was dehydrated on presentation with initial 3.6 L free water deficit. -Continue IV fluid resuscitation, sodium level has much improved at 146 today.   -Repeat labs in a.m.  Hypotension: Secondary to significant dehydration, now stabilized with initial IV fluid resuscitation.  Continue IV fluids. Hold hydralazine.  Acute kidney injury: Prerenal secondary to dehydration.  Continue IV fluids and recheck labs in a.m.  COVID-19 viral infection: Reported positive test as an outpatient approximately 2 weeks ago at her nursing facility.  Husband states she did receive the first dose of Moderna vaccination on 03/01/2019 but did not know she had COVID at that time.  Currently not having respiratory symptoms from the standpoint, chest x-ray is clear, and she is saturating well on room air.  Continue vitamin C and zinc, supplemental oxygen as needed. CRP elevated.  -Trend inflammatory markers.    History of DVT: Patient's husband states that she is now off Xarelto but unsure if this has been continued at her nursing facility.   -Start Lovenox while mentation waxes and wanes, resume Xarelto once able to tolerate po medications better.  -Resume Xarelto in tomorrow.  Alzheimer's dementia with associated delirium/encephalopathy: Delirium superimposed on known Alzheimer's dementia in the setting of significant dehydration.  Continue IV fluids as above and resume donepezil once tolerating po meds. Her mentation has improved today.   Hypophosphatemia.  Replete with IV phosphorus, repeat level in am  DVT prophylaxis: Lovenox Code Status: DNR, confirmed with husband by phone on presentation.  Family Communication: Discussed plan with her husband on the phone in detail, all questions answered.  Disposition Plan: Likely discharge to prior facility tomorrow. CM consulted.  Consults called: None Admission status: Continue  IP care.  Procedures:  None Antimicrobials:  None  Subjective: Patient calm, no focal deficits, following some commands, verbal, tells me "no" when I asked her if she had pain.   Objective: Vitals:   03/17/19 0500 03/17/19  0600 03/17/19 0816 03/17/19 1118  BP:  (!) 165/90 (!) 150/81 (!) 123/54  Pulse:   60   Resp:  11 19   Temp:  (!) 97.5 F (36.4 C) 97.8 F (36.6 C) 97.7 F (36.5 C)  TempSrc:  Axillary Axillary Oral  SpO2:  97% 95%   Weight: 57.3 kg     Height:        Intake/Output Summary (Last 24 hours) at 03/17/2019 1539 Last data filed at 03/17/2019 0600 Gross per 24 hour  Intake 815.95 ml  Output 1500 ml  Net -684.05 ml   Filed Weights   03/16/19 0046 03/17/19 0500  Weight: 54.4 kg 57.3 kg    Examination:  General exam: Appears calm and comfortable  Respiratory system:  Respiratory effort normal. No wheezing.  Cardiovascular system: S1 & S2 present, RRR.  No JVD Gastrointestinal system: Abdomen is nondistended, soft and nontender.  Central nervous system: Alert, following some commands, answering simple questions. Able to move ext x4. No facial droop. Marland Kitchen Extremities: Symmetric 5 x 5 power. Psychiatry: Guarded affect.     Data Reviewed: I have personally reviewed following labs and imaging studies  CBC: Recent Labs  Lab 03/15/19 1530 03/15/19 1545 03/16/19 1052 03/17/19 1053  WBC 13.1*  --  13.8* 8.8  NEUTROABS 9.9*  --  10.7* 6.1  HGB 13.0 13.6 11.4* 11.6*  HCT 44.2 40.0 37.0 37.9  MCV 87.0  --  84.5 83.7  PLT 212  --  127* 116*   Basic Metabolic Panel: Recent Labs  Lab 03/15/19 1530 03/15/19 1545 03/16/19 1052 03/17/19 1053  NA 157* 159* 147* 146*  K 3.4* 3.1* 3.2* 3.9  CL 117*  --  112* 111  CO2 27  --  27 26  GLUCOSE 169*  --  118* 98  BUN 53*  --  35* 14  CREATININE 1.68*  --  1.03* 0.83  CALCIUM 9.5  --  8.9 8.7*  MG  --   --  2.0 1.7  PHOS  --   --  2.6 1.9*   GFR: Estimated Creatinine Clearance: 43.2 mL/min (by C-G formula based on SCr of 0.83 mg/dL). Liver Function Tests: Recent Labs  Lab 03/15/19 1530 03/16/19 1052 03/17/19 1053  AST 157* 77* 46*  ALT 242* 167* 114*  ALKPHOS 79 71 62  BILITOT 1.5* 1.8* 1.4*  PROT 6.4* 5.7* 5.9*  ALBUMIN  3.1* 2.7* 2.7*   No results for input(s): LIPASE, AMYLASE in the last 168 hours. Recent Labs  Lab 03/15/19 1530  AMMONIA 32   Coagulation Profile: Recent Labs  Lab 03/15/19 1530  INR 1.4*   Cardiac Enzymes: No results for input(s): CKTOTAL, CKMB, CKMBINDEX, TROPONINI in the last 168 hours. BNP (last 3 results) No results for input(s): PROBNP in the last 8760 hours. HbA1C: No results for input(s): HGBA1C in the last 72 hours. CBG: Recent Labs  Lab 03/15/19 1454 03/15/19 2031 03/16/19 0326 03/16/19 0539  GLUCAP 124* 93 115* 107*   Lipid Profile: No results for input(s): CHOL, HDL, LDLCALC, TRIG, CHOLHDL, LDLDIRECT in the last 72 hours. Thyroid Function Tests: No results for input(s): TSH, T4TOTAL, FREET4, T3FREE, THYROIDAB in the last 72 hours. Anemia Panel: Recent Labs    03/16/19 1052 03/17/19 1053  FERRITIN 300 311*   Sepsis Labs: Recent Labs  Lab 03/15/19 1516 03/15/19 1845  LATICACIDVEN 2.8* 6.3*    Recent Results (from the past 240 hour(s))  Blood Culture (routine x 2)     Status: None (Preliminary result)   Collection Time: 03/15/19  3:30 PM   Specimen: BLOOD  Result Value Ref Range Status   Specimen Description BLOOD RIGHT FEMORAL ARTERY  Final   Special Requests   Final    BOTTLES DRAWN AEROBIC ONLY Blood Culture results may not be optimal due to an inadequate volume of blood received in culture bottles   Culture   Final    NO GROWTH 2 DAYS Performed at [facility], [place] [address] [place], [place] [address]    Report Status PENDING  Incomplete  Urine culture     Status: None   Collection Time: 03/15/19  5:53 PM   Specimen: In/Out Cath Urine  Result Value Ref Range Status   Specimen Description IN/OUT CATH URINE  Final   Special Requests NONE  Final   Culture   Final    NO GROWTH Performed at [facility], [place] [address] [place], [place] [address]    Report Status 03/17/2019 FINAL  Final  Blood Culture (routine x 2)      Status: None (Preliminary result)   Collection Time: 03/15/19  6:45 PM   Specimen: BLOOD LEFT HAND  Result Value Ref Range Status   Specimen Description BLOOD LEFT HAND  Final   Special Requests   Final    BOTTLES DRAWN AEROBIC AND ANAEROBIC Blood Culture results may not be optimal due to an inadequate volume of blood received in culture bottles   Culture   Final    NO GROWTH 2 DAYS Performed at [facility], [place] [address], [place], [place] [address]    Report Status PENDING  Incomplete  Respiratory Panel by RT PCR (Flu A&B, Covid) - Nasopharyngeal Swab     Status: Abnormal   Collection Time: 03/15/19  7:16 PM   Specimen: Nasopharyngeal Swab  Result Value Ref Range Status   SARS Coronavirus 2 by RT PCR POSITIVE (A) NEGATIVE Final    Comment: RESULT CALLED TO, READ BACK BY AND VERIFIED WITH: B OLDBLAND RN 03/15/19 2054 JDW (NOTE) SARS-CoV-2 target nucleic acids are DETECTED. SARS-CoV-2 RNA is generally detectable in upper respiratory specimens  during the acute phase of infection. Positive results are indicative of the presence of the identified virus, but do not rule out bacterial infection or co-infection with other pathogens not detected by the test. Clinical correlation with patient history and other diagnostic information is necessary to determine patient infection status. The expected result is Negative. Fact Sheet for Patients:  PinkCheek.be Fact Sheet for Healthcare Providers: GravelBags.it This test is not yet approved or cleared by the [place] FDA and  has been authorized for detection and/or diagnosis of SARS-CoV-2 by FDA under an Emergency Use Authorization (EUA).  This EUA will remain in effect (meaning this test can be used) for th e duration of  the COVID-19 declaration under Section 564(b)(1) of the Act, 21 U.S.C. section 360bbb-3(b)(1), unless the authorization is terminated or revoked sooner.     Influenza A by PCR NEGATIVE NEGATIVE Final   Influenza B by PCR NEGATIVE NEGATIVE Final    Comment: (NOTE) The Xpert Xpress SARS-CoV-2/FLU/RSV assay is intended as an aid in  the diagnosis of influenza from Nasopharyngeal swab specimens and  should not be  used as a sole basis for treatment. Nasal washings and  aspirates are unacceptable for Xpert Xpress SARS-CoV-2/FLU/RSV  testing. Fact Sheet for Patients: https://www.moore.com/ Fact Sheet for Healthcare Providers: https://www.young.biz/ This test is not yet approved or cleared by the [place] FDA and  has been authorized for detection and/or diagnosis of SARS-CoV-2 by  FDA under an Emergency Use Authorization (EUA). This EUA will remain  in effect (meaning this test can be used) for the duration of the  Covid-19 declaration under Section 564(b)(1) of the Act, 21  U.S.C. section 360bbb-3(b)(1), unless the authorization is  terminated or revoked. Performed at [facility], [address], [place], [place] [address]   MRSA PCR Screening     Status: None   Collection Time: 03/16/19  4:27 AM   Specimen: Nasopharyngeal  Result Value Ref Range Status   MRSA by PCR NEGATIVE NEGATIVE Final    Comment:        The GeneXpert MRSA Assay (FDA approved for NASAL specimens only), is one component of a comprehensive MRSA colonization surveillance program. It is not intended to diagnose MRSA infection nor to guide or monitor treatment for MRSA infections. Performed at [facility], [address], [place], [place] [address]          Radiology Studies: CT Head Wo Contrast  Result Date: 03/15/2019 CLINICAL DATA:  85 year old female with altered mental status. EXAM: CT HEAD WITHOUT CONTRAST TECHNIQUE: Contiguous axial images were obtained from the base of the skull through the vertex without intravenous contrast. COMPARISON:  Head CT dated 07/02/2018. FINDINGS: Evaluation of this  exam is limited due to motion artifact. Brain: There is moderate age-related atrophy and chronic microvascular ischemic changes. There is no acute intracranial hemorrhage. No mass effect or midline shift. No extra-axial fluid collection. Vascular: No hyperdense vessel or unexpected calcification. Skull: Normal. Negative for fracture or focal lesion. Sinuses/Orbits: No acute finding. Other: None IMPRESSION: 1. No acute intracranial hemorrhage. 2. Age-related atrophy and chronic microvascular ischemic changes. Electronically Signed   By: Elgie Collard M.D.   On: 03/15/2019 23:33   DG CHEST PORT 1 VIEW  Result Date: 03/17/2019 CLINICAL DATA:  Dyspnea. EXAM: PORTABLE CHEST 1 VIEW COMPARISON:  None. FINDINGS: Heart is mildly enlarged. Atherosclerotic changes are noted at the aortic arch. Lungs are clear. There is no edema or effusion. No focal airspace disease is present. IMPRESSION: 1. Borderline cardiomegaly without failure. 2. Atherosclerosis. Electronically Signed   By: Marin Roberts M.D.   On: 03/17/2019 07:26        Scheduled Meds: . vitamin C  1,000 mg Oral Daily  . donepezil  23 mg Oral QHS  . [START ON 03/18/2019] enoxaparin (LOVENOX) injection  1 mg/kg Subcutaneous Q24H  . sodium chloride flush  3 mL Intravenous Q12H  . zinc sulfate  220 mg Oral Daily   Continuous Infusions: . sodium chloride 75 mL/hr at 03/16/19 2307  . magnesium sulfate bolus IVPB    . potassium PHOSPHATE IVPB (in mmol)       LOS: 1 day    Time spent: 35 minutes    Ky Barban, MD [facility]   If 7PM-7AM, please contact night-coverage [email or web address] Password [facility] 03/17/2019, 3:39 PM

## 2019-03-17 NOTE — Evaluation (Signed)
Clinical/Bedside Swallow Evaluation Patient Details  Name: Courtney Day MRN: [id number] Date of Birth: August 31, 1934  Today's Date: 03/17/2019 Time: SLP Start Time (ACUTE ONLY): 1000 SLP Stop Time (ACUTE ONLY): 1013 SLP Time Calculation (min) (ACUTE ONLY): 13 min  Past Medical History:  Past Medical History:  Diagnosis Date  . Allergy   . Asthma    > probably all pseudoasthma, -D/C ACE March 30,2010 > pseudowheeze resolved,cough resolved SPN RLL  . Dementia (HCC) 2010   Dr. Terrace Arabia  . Depression   . Hypertension   . Knee pain    Bilateral  . Low back pain   . Osteoarthritis   . Vitiligo 2010   Past Surgical History:  Past Surgical History:  Procedure Laterality Date  . ABDOMINAL HYSTERECTOMY    . hystoplasmosis lung biopsy- 2010 Dr. Wenda Overland     HPI:  Courtney Day is a 85 y.o. female with medical history significant for Alzheimer's dementia, asthma, history of DVT on Xarelto who presents to the ED from her nursing facility for evaluation after she was found to be unresponsive with hypotension. Hypernatremia/dehydration likely from progressive decreased oral intake possibly exacerbated in setting of COVID-19 viral infection.    Assessment / Plan / Recommendation Clinical Impression  Pt demonstrates impaired cognition, likely worsened from her baseline dementia. She is calm and awake, but poorly interactive with her environment. Her initial response to any items in her visual field is fear, to turn away and resist. With a gentle touch of water to her lips, pt began taking appropriate cup sips with total assist, no signs of aspiration. Progressed to straw sips, bites of puree and bites of graham cracker with occasional ongoing tactile cueing needed. Pt oral phase appeareed WNL and no signs of aspiration were seen. Will resume a soft diet given need for assisted feeding and f/u for tolerance.  SLP Visit Diagnosis: Dysphagia, oropharyngeal phase (R13.12)    Aspiration Risk  Mild aspiration  risk;Risk for inadequate nutrition/hydration    Diet Recommendation Dysphagia 3 (Mech soft);Thin liquid   Liquid Administration via: Cup;Straw Medication Administration: Crushed with puree Supervision: Full supervision/cueing for compensatory strategies;Staff to assist with self feeding Postural Changes: Seated upright at 90 degrees    Other  Recommendations Oral Care Recommendations: Oral care BID   Follow up Recommendations Skilled Nursing facility      Frequency and Duration min 2x/week  2 weeks       Prognosis Prognosis for Safe Diet Advancement: Guarded Barriers to Reach Goals: Cognitive deficits      Swallow Study   General HPI: Courtney Day is a 85 y.o. female with medical history significant for Alzheimer's dementia, asthma, history of DVT on Xarelto who presents to the ED from her nursing facility for evaluation after she was found to be unresponsive with hypotension. Hypernatremia/dehydration likely from progressive decreased oral intake possibly exacerbated in setting of COVID-19 viral infection.  Type of Study: Bedside Swallow Evaluation Diet Prior to this Study: NPO Temperature Spikes Noted: No Respiratory Status: Room air History of Recent Intubation: No Behavior/Cognition: Alert;Requires cueing;Doesn't follow directions Oral Cavity Assessment: Within Functional Limits Oral Care Completed by SLP: No Oral Cavity - Dentition: Adequate natural dentition Self-Feeding Abilities: Total assist Patient Positioning: Upright in bed Baseline Vocal Quality: Not observed Volitional Cough: Cognitively unable to elicit Volitional Swallow: Unable to elicit    Oral/Motor/Sensory Function Overall Oral Motor/Sensory Function: (did not follow commands)   Ice Chips Ice chips: Not tested   Thin Liquid Thin Liquid:  Impaired Presentation: Straw;Cup Oral Phase Impairments: Poor awareness of bolus    Nectar Thick Nectar Thick Liquid: Not tested   Honey Thick Honey Thick Liquid: Not  tested   Puree Puree: Impaired Presentation: Spoon Oral Phase Impairments: Poor awareness of bolus   Solid     Solid: Within functional limits     Herbie Baltimore, MA CCC-SLP  Acute Rehabilitation Services Pager [phone] Office [phone]  Lynann Beaver 03/17/2019,11:29 AM

## 2019-03-17 NOTE — TOC Initial Note (Signed)
Transition of Care [facility]) - Initial/Assessment Note    Patient Details  Name: Courtney Day MRN: [id number] Date of Birth: Sep 21, 1934  [facility] CM/SW Contact:    Kermit Balo, RN Phone Number: 03/17/2019, 12:32 PM  Clinical Narrative:                 Pt is from [facility] SNF. CM reached out to [facility] admissions and the patient can return when medically ready. They state the patients spouse will have some paperwork to fill out prior to her return.  TOC following to facilitate return to SNF when medically ready.   Expected Discharge Plan: Skilled Nursing Facility Barriers to Discharge: Continued Medical Work up   Patient Goals and CMS Choice   CMS Medicare.gov Compare Post Acute Care list provided to:: Patient Represenative (must comment) Choice offered to / list presented to : Spouse  Expected Discharge Plan and Services Expected Discharge Plan: Skilled Nursing Facility In-house Referral: Clinical Social Work Discharge Planning Services: CM Consult Post Acute Care Choice: Skilled Nursing Facility Living arrangements for the past 2 months: Skilled Nursing Facility                                      Prior Living Arrangements/Services Living arrangements for the past 2 months: Skilled Nursing Facility Lives with:: Facility Resident                   Activities of Daily Living   ADL Screening (condition at time of admission) Is the patient deaf or have difficulty hearing?: No Does the patient have difficulty seeing, even when wearing glasses/contacts?: No Does the patient have difficulty concentrating, remembering, or making decisions?: Yes Does the patient have difficulty dressing or bathing?: Yes Does the patient have difficulty walking or climbing stairs?: Yes  Permission Sought/Granted                  Emotional Assessment       Orientation: : Oriented to Self   Psych Involvement: No (comment)  Admission diagnosis:   Hypernatremia [E87.0] Severe dehydration [E86.0] AKI (acute kidney injury) (HCC) [N17.9] Hypotension, unspecified hypotension type [I95.9] Altered mental status, unspecified altered mental status type [R41.82] Sepsis with acute renal failure and septic shock, due to unspecified organism, unspecified acute renal failure type (HCC) [A41.9, R65.21, N17.9] Acute metabolic encephalopathy [G93.41] Patient Active Problem List   Diagnosis Date Noted  . Acute metabolic encephalopathy 03/16/2019  . Severe dehydration 03/15/2019  . Hypotension 03/15/2019  . Hypernatremia 03/15/2019  . AKI (acute kidney injury) (HCC)   . Sepsis secondary to UTI (HCC) 07/23/2018  . Rash and nonspecific skin eruption 07/06/2018  . DVT (deep venous thrombosis) (HCC) 07/05/2018  . Well adult exam 05/18/2013  . Right-sided chest wall pain 09/01/2010  . BREAST PAIN 03/18/2010  . Alzheimer's disease (HCC) 11/26/2009  . Vitiligo 11/26/2009  . PULMONARY NODULE 05/22/2008  . HYPOKALEMIA 05/14/2008  . VERTIGO 05/14/2008  . ALLERGIC RHINITIS 04/27/2008  . Asthma 04/27/2008  . UPPER RESPIRATORY INFECTION (URI) 03/26/2008  . Nonspecific (abnormal) findings on radiological and other examination of body structure 03/26/2008  . CHEST XRAY, ABNORMAL 03/26/2008  . SINUSITIS, ACUTE 02/28/2008  . Osteoarthritis 07/06/2007  . SHOULDER PAIN 07/06/2007  . LEG PAIN 05/03/2007  . Situational depression 12/24/2006  . Essential hypertension 12/24/2006  . LOW BACK PAIN 12/24/2006   PCP:  Plotnikov, Georgina Quint,  MD Pharmacy:   [id number] [place], [place] [address] Phone: [phone] Fax: [facility], [place]. [place]. [place] [address] Phone: [phone] Fax: [phone]     Social Determinants of Health (SDOH) Interventions    Readmission Risk Interventions No flowsheet data  found.

## 2019-03-17 NOTE — [place] FL2 (Signed)
[place] MEDICAID FL2 LEVEL OF CARE SCREENING TOOL     IDENTIFICATION  Patient Name: Courtney Day Birthdate: December 29, 1934 Sex: female Admission Date (Current Location): 03/15/2019  [facility] and [place] Number:  Producer, television/film/video and Address:  [facility], [address] [place], [place] [address]      Provider Number: [id number]  Attending Physician Name and Address:  Ky Barban, MD  Relative Name and Phone Number:       Current Level of Care: Hospital Recommended Level of Care: Skilled Nursing Facility Prior Approval Number:    Date Approved/Denied:   PASRR Number: [id number]  Discharge Plan: SNF    Current Diagnoses: Patient Active Problem List   Diagnosis Date Noted  . Acute metabolic encephalopathy 03/16/2019  . Severe dehydration 03/15/2019  . Hypotension 03/15/2019  . Hypernatremia 03/15/2019  . AKI (acute kidney injury) (HCC)   . Sepsis secondary to UTI (HCC) 07/23/2018  . Rash and nonspecific skin eruption 07/06/2018  . DVT (deep venous thrombosis) (HCC) 07/05/2018  . Well adult exam 05/18/2013  . Right-sided chest wall pain 09/01/2010  . BREAST PAIN 03/18/2010  . Alzheimer's disease (HCC) 11/26/2009  . Vitiligo 11/26/2009  . PULMONARY NODULE 05/22/2008  . HYPOKALEMIA 05/14/2008  . VERTIGO 05/14/2008  . ALLERGIC RHINITIS 04/27/2008  . Asthma 04/27/2008  . UPPER RESPIRATORY INFECTION (URI) 03/26/2008  . Nonspecific (abnormal) findings on radiological and other examination of body structure 03/26/2008  . CHEST XRAY, ABNORMAL 03/26/2008  . SINUSITIS, ACUTE 02/28/2008  . Osteoarthritis 07/06/2007  . SHOULDER PAIN 07/06/2007  . LEG PAIN 05/03/2007  . Situational depression 12/24/2006  . Essential hypertension 12/24/2006  . LOW BACK PAIN 12/24/2006    Orientation RESPIRATION BLADDER Height & Weight     Self  Normal Incontinent Weight: 57.3 kg Height:  5\' 3"  (160 cm)  BEHAVIORAL SYMPTOMS/MOOD NEUROLOGICAL BOWEL  NUTRITION STATUS      Incontinent Diet(dysphagia 3 with thin liquids)  AMBULATORY STATUS COMMUNICATION OF NEEDS Skin   Extensive Assist Verbally PU Stage and Appropriate Care(stage 2 to sacrum with foam dressing)                       Personal Care Assistance Level of Assistance  Bathing, Dressing, Feeding Bathing Assistance: Maximum assistance Feeding assistance: Maximum assistance Dressing Assistance: Maximum assistance     Functional Limitations Info  Sight, Speech, Hearing Sight Info: Impaired Hearing Info: Adequate Speech Info: Adequate    SPECIAL CARE FACTORS FREQUENCY                       Contractures Contractures Info: Not present    Additional Factors Info  Code Status, Allergies, Psychotropic Code Status Info: DNR Allergies Info: ace inhibitors/ amlodipine/ amlodipine besylate-valsartan/ namenda/ valsartan Psychotropic Info: aricept 23 mg at bedtime         Current Medications (03/17/2019):  This is the current hospital active medication list Current Facility-Administered Medications  Medication Dose Route Frequency Provider Last Rate Last Admin  . 0.9 %  sodium chloride infusion   Intravenous Continuous 03/19/2019, MD 75 mL/hr at 03/16/19 2307 New Bag at 03/16/19 2307  . acetaminophen (TYLENOL) tablet 650 mg  650 mg Oral Q6H PRN 2308, MD       Or  . acetaminophen (TYLENOL) suppository 650 mg  650 mg Rectal Q6H PRN Charlsie Quest, MD      . ascorbic acid (VITAMIN C) tablet  1,000 mg  1,000 mg Oral Daily Zada Finders R, MD   1,000 mg at 03/17/19 0820  . donepezil (ARICEPT) tablet 23 mg  23 mg Oral QHS Lenore Cordia, MD   23 mg at 03/16/19 2221  . [START ON 03/18/2019] enoxaparin (LOVENOX) injection 55 mg  1 mg/kg Subcutaneous Q24H Adele Barthel D, MD      . magnesium sulfate IVPB 2 g 50 mL  2 g Intravenous Once Adele Barthel D, MD      . ondansetron [facility]) tablet 4 mg  4 mg Oral Q6H PRN Lenore Cordia, MD       Or   . ondansetron (ZOFRAN) injection 4 mg  4 mg Intravenous Q6H PRN Zada Finders R, MD      . potassium PHOSPHATE 20 mmol in dextrose 5 % 500 mL infusion  20 mmol Intravenous Once Adele Barthel D, MD      . sodium chloride flush (NS) 0.9 % injection 3 mL  3 mL Intravenous Q12H Zada Finders R, MD   3 mL at 03/16/19 2214  . zinc sulfate capsule 220 mg  220 mg Oral Daily Lenore Cordia, MD   220 mg at 03/17/19 0820     Discharge Medications: Please see discharge summary for a list of discharge medications.  Relevant Imaging Results:  Relevant Lab Results:   Additional Information SSN: [id number]  Pollie Friar, RN

## 2019-03-18 DIAGNOSIS — M6281 Muscle weakness (generalized): Secondary | ICD-10-CM | POA: Diagnosis not present

## 2019-03-18 DIAGNOSIS — E785 Hyperlipidemia, unspecified: Secondary | ICD-10-CM | POA: Diagnosis not present

## 2019-03-18 DIAGNOSIS — E039 Hypothyroidism, unspecified: Secondary | ICD-10-CM | POA: Diagnosis not present

## 2019-03-18 DIAGNOSIS — U071 COVID-19: Secondary | ICD-10-CM | POA: Diagnosis not present

## 2019-03-18 DIAGNOSIS — N179 Acute kidney failure, unspecified: Secondary | ICD-10-CM | POA: Diagnosis not present

## 2019-03-18 DIAGNOSIS — Z20828 Contact with and (suspected) exposure to other viral communicable diseases: Secondary | ICD-10-CM | POA: Diagnosis not present

## 2019-03-18 DIAGNOSIS — E46 Unspecified protein-calorie malnutrition: Secondary | ICD-10-CM | POA: Diagnosis not present

## 2019-03-18 DIAGNOSIS — D649 Anemia, unspecified: Secondary | ICD-10-CM | POA: Diagnosis not present

## 2019-03-18 DIAGNOSIS — Z23 Encounter for immunization: Secondary | ICD-10-CM | POA: Diagnosis not present

## 2019-03-18 DIAGNOSIS — M1990 Unspecified osteoarthritis, unspecified site: Secondary | ICD-10-CM | POA: Diagnosis not present

## 2019-03-18 DIAGNOSIS — A419 Sepsis, unspecified organism: Secondary | ICD-10-CM | POA: Diagnosis not present

## 2019-03-18 DIAGNOSIS — R2681 Unsteadiness on feet: Secondary | ICD-10-CM | POA: Diagnosis not present

## 2019-03-18 DIAGNOSIS — Z7401 Bed confinement status: Secondary | ICD-10-CM | POA: Diagnosis not present

## 2019-03-18 DIAGNOSIS — R279 Unspecified lack of coordination: Secondary | ICD-10-CM | POA: Diagnosis not present

## 2019-03-18 DIAGNOSIS — G309 Alzheimer's disease, unspecified: Secondary | ICD-10-CM | POA: Diagnosis not present

## 2019-03-18 DIAGNOSIS — I82409 Acute embolism and thrombosis of unspecified deep veins of unspecified lower extremity: Secondary | ICD-10-CM | POA: Diagnosis not present

## 2019-03-18 DIAGNOSIS — E860 Dehydration: Secondary | ICD-10-CM | POA: Diagnosis not present

## 2019-03-18 DIAGNOSIS — M2550 Pain in unspecified joint: Secondary | ICD-10-CM | POA: Diagnosis not present

## 2019-03-18 DIAGNOSIS — R2689 Other abnormalities of gait and mobility: Secondary | ICD-10-CM | POA: Diagnosis not present

## 2019-03-18 DIAGNOSIS — G9341 Metabolic encephalopathy: Secondary | ICD-10-CM | POA: Diagnosis not present

## 2019-03-18 DIAGNOSIS — E559 Vitamin D deficiency, unspecified: Secondary | ICD-10-CM | POA: Diagnosis not present

## 2019-03-18 DIAGNOSIS — N178 Other acute kidney failure: Secondary | ICD-10-CM | POA: Diagnosis not present

## 2019-03-18 DIAGNOSIS — Z79899 Other long term (current) drug therapy: Secondary | ICD-10-CM | POA: Diagnosis not present

## 2019-03-18 DIAGNOSIS — R531 Weakness: Secondary | ICD-10-CM | POA: Diagnosis not present

## 2019-03-18 DIAGNOSIS — I10 Essential (primary) hypertension: Secondary | ICD-10-CM | POA: Diagnosis not present

## 2019-03-18 DIAGNOSIS — Q159 Congenital malformation of eye, unspecified: Secondary | ICD-10-CM | POA: Diagnosis not present

## 2019-03-18 LAB — CBC WITH DIFFERENTIAL/PLATELET
Abs Immature Granulocytes: 0.01 10*3/uL (ref 0.00–0.07)
Basophils Absolute: 0 10*3/uL (ref 0.0–0.1)
Basophils Relative: 0 %
Eosinophils Absolute: 0.1 10*3/uL (ref 0.0–0.5)
Eosinophils Relative: 2 %
HCT: 38.6 % (ref 36.0–46.0)
Hemoglobin: 12.1 g/dL (ref 12.0–15.0)
Immature Granulocytes: 0 %
Lymphocytes Relative: 27 %
Lymphs Abs: 1.8 10*3/uL (ref 0.7–4.0)
MCH: 25.7 pg — ABNORMAL LOW (ref 26.0–34.0)
MCHC: 31.3 g/dL (ref 30.0–36.0)
MCV: 82 fL (ref 80.0–100.0)
Monocytes Absolute: 0.4 10*3/uL (ref 0.1–1.0)
Monocytes Relative: 6 %
Neutro Abs: 4.4 10*3/uL (ref 1.7–7.7)
Neutrophils Relative %: 65 %
Platelets: 117 10*3/uL — ABNORMAL LOW (ref 150–400)
RBC: 4.71 MIL/uL (ref 3.87–5.11)
RDW: 16.3 % — ABNORMAL HIGH (ref 11.5–15.5)
WBC: 6.8 10*3/uL (ref 4.0–10.5)
nRBC: 0 % (ref 0.0–0.2)

## 2019-03-18 LAB — COMPREHENSIVE METABOLIC PANEL
ALT: 86 U/L — ABNORMAL HIGH (ref 0–44)
AST: 36 U/L (ref 15–41)
Albumin: 2.8 g/dL — ABNORMAL LOW (ref 3.5–5.0)
Alkaline Phosphatase: 71 U/L (ref 38–126)
Anion gap: 10 (ref 5–15)
BUN: 9 mg/dL (ref 8–23)
CO2: 24 mmol/L (ref 22–32)
Calcium: 8.7 mg/dL — ABNORMAL LOW (ref 8.9–10.3)
Chloride: 106 mmol/L (ref 98–111)
Creatinine, Ser: 0.71 mg/dL (ref 0.44–1.00)
GFR calc Af Amer: >60 mL/min (ref >60)
GFR calc non Af Amer: >60 mL/min (ref >60)
Glucose, Bld: 100 mg/dL — ABNORMAL HIGH (ref 70–99)
Potassium: 3.7 mmol/L (ref 3.5–5.1)
Sodium: 140 mmol/L (ref 135–145)
Total Bilirubin: 1.6 mg/dL — ABNORMAL HIGH (ref 0.3–1.2)
Total Protein: 6.3 g/dL — ABNORMAL LOW (ref 6.5–8.1)

## 2019-03-18 LAB — FERRITIN: Ferritin: 286 ng/mL (ref 11–307)

## 2019-03-18 LAB — PHOSPHORUS: Phosphorus: 2.7 mg/dL (ref 2.5–4.6)

## 2019-03-18 LAB — MAGNESIUM: Magnesium: 1.6 mg/dL — ABNORMAL LOW (ref 1.7–2.4)

## 2019-03-18 LAB — D-DIMER, QUANTITATIVE: D-Dimer, Quant: 0.56 ug/mL-FEU — ABNORMAL HIGH (ref 0.00–0.50)

## 2019-03-18 LAB — C-REACTIVE PROTEIN: CRP: 8.4 mg/dL — ABNORMAL HIGH (ref <1.0)

## 2019-03-18 NOTE — Progress Notes (Signed)
03/18/2019 Rn made five attempt to call nurse at facility and unable to. A message was left on answering machine to call nurse at [facility]. Lovie Macadamia RN

## 2019-03-18 NOTE — Discharge Summary (Addendum)
Physician Discharge Summary  Courtney Day KZS:[id number] DOB: March 15, 1934 DOA: 03/15/2019  PCP: Tresa Garter, MD  Admit date: 03/15/2019 Discharge date: 03/18/2019  Admitted From: SNF Disposition:  SNF  Recommendations for Outpatient Follow-up:  1. Follow up with PCP in 1-2 weeks 2. Please obtain BMP/CBC in one week, patient on Dysphagia 3 diet, needs assistance with feeding.    Home Health:No Equipment/Devices: Noe Discharge Condition: Stable CODE STATUS: DNR Diet recommendation: Dysphagia 3 with thin liquids with assitance   Brief/Interim Summary: Per DDU:KGUR H Courtney Day a 85 y.o.femalewith medical history significant forAlzheimer's dementia, asthma, history of DVT on Xarelto who presents to the ED from her nursing facility for evaluation after she was found to be unresponsive with hypotension.Unable to obtain history from patient therefore entire history is supplemented by EDP, chart review, and husband by phone.   Husband states he has had limited interaction with his wife since she has been in the facility during the COVID-19 pandemic. He says the last time he saw her she was able to call out his name but was otherwise not conversant. Per ED documentation patient had an episode of unresponsiveness at her facility and was noted to be hypotensive. She reportedly been diagnosed with COVID-19 viral infection 2 weeks ago. Husband states that she did receive the first dose of the [place] vaccine on 03/01/2019 which was the same day she ended up testing positive. She has been in quarantine and seems to not have been having many upper respiratory symptoms or oxygen requirement.  In the ED she was found to have hypotension with BP as low as 64/74, stable O2 sats on RA, no fever, and was lethargic. CXR was negative for consolidation. CT head was negative for acute changes. She was started on broad spectrum antibiotics.   Discharge Diagnoses:  Principal Problem:   Severe  dehydration Active Problems:   Alzheimer's disease (HCC)   AKI (acute kidney injury) (HCC)   Hypotension   Hypernatremia   Acute metabolic encephalopathy  Hypernatremia/dehydration: Likely from progressive decreased oral intake possibly exacerbated in setting of COVID-19 viral infection. She was dehydrated on presentation with initial 3.6 L free water deficit. -She was treated with IV fluids and her sodium level normalized. She was encouraged to increase her oral intake once she returned to her facility.   Hypotension: Secondary to significant dehydration, now stabilized with initial IV fluid resuscitation. Will resume her home hydralazine.   Acute kidney injury: Prerenal secondary to dehydration. resolved with IV fluids for rehydration.   COVID-19 viral infection: Reported positive test as an outpatient approximately 2 weeks ago at her nursing facility. Husband states she did receive the first dose of Moderna vaccination on 03/01/2019 but did not know she had COVID at that time. Currently not having respiratory symptoms from the standpoint, chest x-ray is clear, and she is saturating well on room air. Continue vitamin C and zinc, supplemental oxygen as needed.   Sepsis with acute renal failureandhypotension, due to unspecified organism, unspecifiedacute renal failure. Ruled in. Thought to be secondary to COVID-19 infection.    History of DVT: Patient's husband states that she is now off Xarelto but unsure if this has been continued at her nursing facility.  -She was on Lovenox due to her initial decrease in mentation but will continue Xarelto upon discharge. Due to her COVID-19 recent infection she is at risk for recurrent VTE.   Alzheimer's dementiawith associated delirium/encephalopathy: Delirium superimposed on known Alzheimer's dementia in the setting of significant dehydration.  Her mentation improved and it is at baseline at the time of her discharge.     Hypophosphatemia.  Resolved with repletion with IV phosphorus.  DVT prophylaxis:Lovenox Code Status:DNR, confirmed with husband by phone on presentation.  Family Communication:Discussed plan with her husband on the phone in detail, all questions answered.  Disposition Plan:Discharge back to her facility today. CM consulted.   Consults called:None  Discharge Instructions  Discharge Instructions    Discharge instructions   Complete by: As directed    Dysphagia 3 diet with thin liquids with assistance   Increase activity slowly   Complete by: As directed      Allergies as of 03/18/2019      Reactions   Ace Inhibitors Cough   Amlodipine Other (See Comments)   dizziness   Amlodipine Besylate-valsartan Other (See Comments)   dizziness   Namenda [memantine Hcl] Other (See Comments)   Unknown reaction   Valsartan Other (See Comments)   dizziness      Medication List    TAKE these medications   acetaminophen 325 MG tablet Commonly known as: TYLENOL Take 2 tablets (650 mg total) by mouth every 6 (six) hours as needed for mild pain (or Fever >/= 101).   benzonatate 100 MG capsule Commonly known as: TESSALON Take 100 mg by mouth every 8 (eight) hours.   donepezil 23 MG Tabs tablet Commonly known as: ARICEPT Take 1 tablet (23 mg total) by mouth at bedtime.   hydrALAZINE 10 MG tablet Commonly known as: APRESOLINE Take 1 tablet (10 mg total) by mouth 2 (two) times a day. What changed: when to take this   Knee Compression Sleeve/S/M Misc 1 each by Does not apply route daily. Put on left leg daily and take off at night   rivaroxaban 20 MG Tabs tablet Commonly known as: XARELTO Take 1 tablet (20 mg total) by mouth daily with supper.   triamcinolone cream 0.5 % Commonly known as: KENALOG Apply 1 application topically 3 (three) times daily.   vitamin C 1000 MG tablet Take 1,000 mg by mouth daily.   Vitamin D (Ergocalciferol) 1.25 MG (50000 UNIT) Caps  capsule Commonly known as: DRISDOL Take 1 capsule (50,000 Units total) by mouth every 30 (thirty) days.   Vitamin D3 250 MCG (10000 UT) Tabs Take 1 tablet by mouth daily.   Zinc Gluconate 100 MG Tabs Take 1 tablet by mouth daily.       Allergies  Allergen Reactions  . Ace Inhibitors Cough  . Amlodipine Other (See Comments)    dizziness  . Amlodipine Besylate-Valsartan Other (See Comments)    dizziness  . Namenda [Memantine Hcl] Other (See Comments)    Unknown reaction  . Valsartan Other (See Comments)    dizziness    Consultations:  None   Procedures/Studies: CT Head Wo Contrast  Result Date: 03/15/2019 CLINICAL DATA:  85 year old female with altered mental status. EXAM: CT HEAD WITHOUT CONTRAST TECHNIQUE: Contiguous axial images were obtained from the base of the skull through the vertex without intravenous contrast. COMPARISON:  Head CT dated 07/02/2018. FINDINGS: Evaluation of this exam is limited due to motion artifact. Brain: There is moderate age-related atrophy and chronic microvascular ischemic changes. There is no acute intracranial hemorrhage. No mass effect or midline shift. No extra-axial fluid collection. Vascular: No hyperdense vessel or unexpected calcification. Skull: Normal. Negative for fracture or focal lesion. Sinuses/Orbits: No acute finding. Other: None IMPRESSION: 1. No acute intracranial hemorrhage. 2. Age-related atrophy and chronic microvascular ischemic changes. Electronically  Signed   By: Elgie CollardArash  Radparvar M.D.   On: 03/15/2019 23:33   DG CHEST PORT 1 VIEW  Result Date: 03/17/2019 CLINICAL DATA:  Dyspnea. EXAM: PORTABLE CHEST 1 VIEW COMPARISON:  None. FINDINGS: Heart is mildly enlarged. Atherosclerotic changes are noted at the aortic arch. Lungs are clear. There is no edema or effusion. No focal airspace disease is present. IMPRESSION: 1. Borderline cardiomegaly without failure. 2. Atherosclerosis. Electronically Signed   By: Marin Robertshristopher  Mattern M.D.    On: 03/17/2019 07:26   DG Chest Port 1 View  Result Date: 03/15/2019 CLINICAL DATA:  Altered mental status. EXAM: PORTABLE CHEST 1 VIEW COMPARISON:  06/26/2018 FINDINGS: Lungs are adequately inflated and otherwise clear. Postsurgical change over the right hilar region and right mid to lower lung. Cardiomediastinal silhouette and remainder of the exam is unchanged. IMPRESSION: No active disease. Electronically Signed   By: Elberta Fortisaniel  Boyle M.D.   On: 03/15/2019 15:39        Subjective: Patient reports feeling better. Sitting up in bed, eating lunch with assistance this afternoon.   Discharge Exam: Vitals:   03/18/19 0723 03/18/19 1155  BP: (!) 159/79 (!) 162/140  Pulse: (!) 56 82  Resp: 20 16  Temp: 97.9 F (36.6 C) 97.9 F (36.6 C)  SpO2: 97% (!) 87%   Vitals:   03/18/19 0428 03/18/19 0500 03/18/19 0723 03/18/19 1155  BP:   (!) 159/79 (!) 162/140  Pulse: 69  (!) 56 82  Resp: 19  20 16   Temp: 98.8 F (37.1 C)  97.9 F (36.6 C) 97.9 F (36.6 C)  TempSrc: Axillary  Axillary Axillary  SpO2: 93%  97% (!) 87%  Weight:  57.5 kg    Height:        General: Pt is alert, awake, not in acute distress Cardiovascular: RRR, S1/S2 present Respiratory: No respiratory distress, no wheezing Abdominal: Soft, NT, ND Extremities: no edema, no cyanosis    The results of significant diagnostics from this hospitalization (including imaging, microbiology, ancillary and laboratory) are listed below for reference.     Microbiology: Recent Results (from the past 240 hour(s))  Blood Culture (routine x 2)     Status: None (Preliminary result)   Collection Time: 03/15/19  3:30 PM   Specimen: BLOOD  Result Value Ref Range Status   Specimen Description BLOOD RIGHT FEMORAL ARTERY  Final   Special Requests   Final    BOTTLES DRAWN AEROBIC ONLY Blood Culture results may not be optimal due to an inadequate volume of blood received in culture bottles   Culture   Final    NO GROWTH 3  DAYS Performed at [facility], [address] [place], [place] [address]    Report Status PENDING  Incomplete  Urine culture     Status: None   Collection Time: 03/15/19  5:53 PM   Specimen: In/Out Cath Urine  Result Value Ref Range Status   Specimen Description IN/OUT CATH URINE  Final   Special Requests NONE  Final   Culture   Final    NO GROWTH Performed at [facility], [address] [place], [place] [address]    Report Status 03/17/2019 FINAL  Final  Blood Culture (routine x 2)     Status: None (Preliminary result)   Collection Time: 03/15/19  6:45 PM   Specimen: BLOOD LEFT HAND  Result Value Ref Range Status   Specimen Description BLOOD LEFT HAND  Final   Special Requests   Final  BOTTLES DRAWN AEROBIC AND ANAEROBIC Blood Culture results may not be optimal due to an inadequate volume of blood received in culture bottles   Culture   Final    NO GROWTH 3 DAYS Performed at [facility], [address] [place], [place] [address]    Report Status PENDING  Incomplete  Respiratory Panel by RT PCR (Flu A&B, Covid) - Nasopharyngeal Swab     Status: Abnormal   Collection Time: 03/15/19  7:16 PM   Specimen: Nasopharyngeal Swab  Result Value Ref Range Status   SARS Coronavirus 2 by RT PCR POSITIVE (A) NEGATIVE Final    Comment: RESULT CALLED TO, READ BACK BY AND VERIFIED WITH: B OLDBLAND RN 03/15/19 2054 JDW (NOTE) SARS-CoV-2 target nucleic acids are DETECTED. SARS-CoV-2 RNA is generally detectable in upper respiratory specimens  during the acute phase of infection. Positive results are indicative of the presence of the identified virus, but do not rule out bacterial infection or co-infection with other pathogens not detected by the test. Clinical correlation with patient history and other diagnostic information is necessary to determine patient infection status. The expected result is Negative. Fact Sheet for Patients:   https://www.moore.com/https://www.fda.gov/media/142436/download Fact Sheet for Healthcare Providers: https://www.young.biz/https://www.fda.gov/media/142435/download This test is not yet approved or cleared by the [place] FDA and  has been authorized for detection and/or diagnosis of SARS-CoV-2 by FDA under an Emergency Use Authorization (EUA).  This EUA will remain in effect (meaning this test can be used) for th e duration of  the COVID-19 declaration under Section 564(b)(1) of the Act, 21 U.S.C. section 360bbb-3(b)(1), unless the authorization is terminated or revoked sooner.    Influenza A by PCR NEGATIVE NEGATIVE Final   Influenza B by PCR NEGATIVE NEGATIVE Final    Comment: (NOTE) The Xpert Xpress SARS-CoV-2/FLU/RSV assay is intended as an aid in  the diagnosis of influenza from Nasopharyngeal swab specimens and  should not be used as a sole basis for treatment. Nasal washings and  aspirates are unacceptable for Xpert Xpress SARS-CoV-2/FLU/RSV  testing. Fact Sheet for Patients: https://www.moore.com/https://www.fda.gov/media/142436/download Fact Sheet for Healthcare Providers: https://www.young.biz/https://www.fda.gov/media/142435/download This test is not yet approved or cleared by the [place] FDA and  has been authorized for detection and/or diagnosis of SARS-CoV-2 by  FDA under an Emergency Use Authorization (EUA). This EUA will remain  in effect (meaning this test can be used) for the duration of the  Covid-19 declaration under Section 564(b)(1) of the Act, 21  U.S.C. section 360bbb-3(b)(1), unless the authorization is  terminated or revoked. Performed at [facility], [address] [place], [place] [address]   MRSA PCR Screening     Status: None   Collection Time: 03/16/19  4:27 AM   Specimen: Nasopharyngeal  Result Value Ref Range Status   MRSA by PCR NEGATIVE NEGATIVE Final    Comment:        The GeneXpert MRSA Assay (FDA approved for NASAL specimens only), is one component of a comprehensive MRSA colonization surveillance  program. It is not intended to diagnose MRSA infection nor to guide or monitor treatment for MRSA infections. Performed at [facility], [address] [place], [place] [address]      Labs: BNP (last 3 results) No results for input(s): BNP in the last 8760 hours. Basic Metabolic Panel: Recent Labs  Lab 03/15/19 1530 03/15/19 1545 03/16/19 1052 03/17/19 1053 03/18/19 0949  NA 157* 159* 147* 146* 140  K 3.4* 3.1* 3.2* 3.9 3.7  CL  117*  --  112* 111 106  CO2 27  --  27 26 24   GLUCOSE 169*  --  118* 98 100*  BUN 53*  --  35* 14 9  CREATININE 1.68*  --  1.03* 0.83 0.71  CALCIUM 9.5  --  8.9 8.7* 8.7*  MG  --   --  2.0 1.7 1.6*  PHOS  --   --  2.6 1.9* 2.7   Liver Function Tests: Recent Labs  Lab 03/15/19 1530 03/16/19 1052 03/17/19 1053 03/18/19 0949  AST 157* 77* 46* 36  ALT 242* 167* 114* 86*  ALKPHOS 79 71 62 71  BILITOT 1.5* 1.8* 1.4* 1.6*  PROT 6.4* 5.7* 5.9* 6.3*  ALBUMIN 3.1* 2.7* 2.7* 2.8*   No results for input(s): LIPASE, AMYLASE in the last 168 hours. Recent Labs  Lab 03/15/19 1530  AMMONIA 32   CBC: Recent Labs  Lab 03/15/19 1530 03/15/19 1545 03/16/19 1052 03/17/19 1053 03/18/19 0949  WBC 13.1*  --  13.8* 8.8 6.8  NEUTROABS 9.9*  --  10.7* 6.1 4.4  HGB 13.0 13.6 11.4* 11.6* 12.1  HCT 44.2 40.0 37.0 37.9 38.6  MCV 87.0  --  84.5 83.7 82.0  PLT 212  --  127* 116* 117*   Cardiac Enzymes: No results for input(s): CKTOTAL, CKMB, CKMBINDEX, TROPONINI in the last 168 hours. BNP: Invalid input(s): POCBNP CBG: Recent Labs  Lab 03/15/19 1454 03/15/19 2031 03/16/19 0326 03/16/19 0539  GLUCAP 124* 93 115* 107*   D-Dimer Recent Labs    03/17/19 1053 03/18/19 0949  DDIMER 0.52* 0.56*   Hgb A1c No results for input(s): HGBA1C in the last 72 hours. Lipid Profile No results for input(s): CHOL, HDL, LDLCALC, TRIG, CHOLHDL, LDLDIRECT in the last 72 hours. Thyroid function studies No results for input(s): TSH, T4TOTAL, T3FREE,  THYROIDAB in the last 72 hours.  Invalid input(s): FREET3 Anemia work up Recent Labs    03/17/19 1053 03/18/19 0949  FERRITIN 311* 286   Urinalysis    Component Value Date/Time   COLORURINE AMBER (A) 03/15/2019 1751   APPEARANCEUR CLOUDY (A) 03/15/2019 1751   LABSPEC 1.030 03/15/2019 1751   PHURINE 5.0 03/15/2019 1751   GLUCOSEU NEGATIVE 03/15/2019 1751   GLUCOSEU NEGATIVE 04/14/2016 0952   HGBUR NEGATIVE 03/15/2019 1751   BILIRUBINUR SMALL (A) 03/15/2019 1751   KETONESUR NEGATIVE 03/15/2019 1751   PROTEINUR 100 (A) 03/15/2019 1751   UROBILINOGEN 0.2 04/14/2016 0952   NITRITE NEGATIVE 03/15/2019 1751   LEUKOCYTESUR NEGATIVE 03/15/2019 1751   Sepsis Labs Invalid input(s): PROCALCITONIN,  WBC,  LACTICIDVEN Microbiology Recent Results (from the past 240 hour(s))  Blood Culture (routine x 2)     Status: None (Preliminary result)   Collection Time: 03/15/19  3:30 PM   Specimen: BLOOD  Result Value Ref Range Status   Specimen Description BLOOD RIGHT FEMORAL ARTERY  Final   Special Requests   Final    BOTTLES DRAWN AEROBIC ONLY Blood Culture results may not be optimal due to an inadequate volume of blood received in culture bottles   Culture   Final    NO GROWTH 3 DAYS Performed at [facility], [address], [place], [place]    Report Status PENDING  Incomplete  Urine culture     Status: None   Collection Time: 03/15/19  5:53 PM   Specimen: In/Out Cath Urine  Result Value Ref Range Status   Specimen Description IN/OUT CATH URINE  Final   Special Requests  NONE  Final   Culture   Final    NO GROWTH Performed at [facility], [address] [place], [place] [address]    Report Status 03/17/2019 FINAL  Final  Blood Culture (routine x 2)     Status: None (Preliminary result)   Collection Time: 03/15/19  6:45 PM   Specimen: BLOOD LEFT HAND  Result Value Ref Range Status   Specimen Description BLOOD LEFT HAND  Final   Special Requests   Final     BOTTLES DRAWN AEROBIC AND ANAEROBIC Blood Culture results may not be optimal due to an inadequate volume of blood received in culture bottles   Culture   Final    NO GROWTH 3 DAYS Performed at [facility], [address], [place], [place] [address]    Report Status PENDING  Incomplete  Respiratory Panel by RT PCR (Flu A&B, Covid) - Nasopharyngeal Swab     Status: Abnormal   Collection Time: 03/15/19  7:16 PM   Specimen: Nasopharyngeal Swab  Result Value Ref Range Status   SARS Coronavirus 2 by RT PCR POSITIVE (A) NEGATIVE Final    Comment: RESULT CALLED TO, READ BACK BY AND VERIFIED WITH: B OLDBLAND RN 03/15/19 2054 JDW (NOTE) SARS-CoV-2 target nucleic acids are DETECTED. SARS-CoV-2 RNA is generally detectable in upper respiratory specimens  during the acute phase of infection. Positive results are indicative of the presence of the identified virus, but do not rule out bacterial infection or co-infection with other pathogens not detected by the test. Clinical correlation with patient history and other diagnostic information is necessary to determine patient infection status. The expected result is Negative. Fact Sheet for Patients:  https://www.moore.com/ Fact Sheet for Healthcare Providers: https://www.young.biz/ This test is not yet approved or cleared by the [place] FDA and  has been authorized for detection and/or diagnosis of SARS-CoV-2 by FDA under an Emergency Use Authorization (EUA).  This EUA will remain in effect (meaning this test can be used) for th e duration of  the COVID-19 declaration under Section 564(b)(1) of the Act, 21 U.S.C. section 360bbb-3(b)(1), unless the authorization is terminated or revoked sooner.    Influenza A by PCR NEGATIVE NEGATIVE Final   Influenza B by PCR NEGATIVE NEGATIVE Final    Comment: (NOTE) The Xpert Xpress SARS-CoV-2/FLU/RSV assay is intended as an aid in  the diagnosis of influenza  from Nasopharyngeal swab specimens and  should not be used as a sole basis for treatment. Nasal washings and  aspirates are unacceptable for Xpert Xpress SARS-CoV-2/FLU/RSV  testing. Fact Sheet for Patients: https://www.moore.com/ Fact Sheet for Healthcare Providers: https://www.young.biz/ This test is not yet approved or cleared by the [place] FDA and  has been authorized for detection and/or diagnosis of SARS-CoV-2 by  FDA under an Emergency Use Authorization (EUA). This EUA will remain  in effect (meaning this test can be used) for the duration of the  Covid-19 declaration under Section 564(b)(1) of the Act, 21  U.S.C. section 360bbb-3(b)(1), unless the authorization is  terminated or revoked. Performed at [facility], [address] [place], [place] [address]   MRSA PCR Screening     Status: None   Collection Time: 03/16/19  4:27 AM   Specimen: Nasopharyngeal  Result Value Ref Range Status   MRSA by PCR NEGATIVE NEGATIVE Final    Comment:        The GeneXpert MRSA Assay (FDA approved for NASAL specimens only), is one component of  a comprehensive MRSA colonization surveillance program. It is not intended to diagnose MRSA infection nor to guide or monitor treatment for MRSA infections. Performed at [facility], [address] [place], [place] [address]      Time coordinating discharge: Over 33 minutes  SIGNED:   Ky Barban, MD  [facility] 03/18/2019, 1:35 PM    If 7PM-7AM, please contact night-coverage [email or web address] Password [email or web address]

## 2019-03-18 NOTE — Discharge Instructions (Signed)
Information on my medicine - XARELTO® (rivaroxaban) ° °WHY WAS XARELTO® PRESCRIBED FOR YOU? °Xarelto® was prescribed to treat blood clots that may have been found in the veins of your legs (deep vein thrombosis) or in your lungs (pulmonary embolism) and to reduce the risk of them occurring again. ° °What do you need to know about Xarelto®? °Take one 20 mg tablet taken ONCE A DAY with your evening meal. ° °DO NOT stop taking Xarelto® without talking to the health care provider who prescribed the medication.  Refill your prescription for 20 mg tablets before you run out. ° °After discharge, you should have regular check-up appointments with your healthcare provider that is prescribing your Xarelto®.  In the future your dose may need to be changed if your kidney function changes by a significant amount. ° °What do you do if you miss a dose? °If you are taking Xarelto® TWICE DAILY and you miss a dose, take it as soon as you remember. You may take two 15 mg tablets (total 30 mg) at the same time then resume your regularly scheduled 15 mg twice daily the next day. ° °If you are taking Xarelto® ONCE DAILY and you miss a dose, take it as soon as you remember on the same day then continue your regularly scheduled once daily regimen the next day. Do not take two doses of Xarelto® at the same time.  ° °Important Safety Information °Xarelto® is a blood thinner medicine that can cause bleeding. You should call your healthcare provider right away if you experience any of the following: °? Bleeding from an injury or your nose that does not stop. °? Unusual colored urine (red or dark brown) or unusual colored stools (red or black). °? Unusual bruising for unknown reasons. °? A serious fall or if you hit your head (even if there is no bleeding). ° °Some medicines may interact with Xarelto® and might increase your risk of bleeding while on Xarelto®. To help avoid this, consult your healthcare provider or pharmacist prior to using any  new prescription or non-prescription medications, including herbals, vitamins, non-steroidal anti-inflammatory drugs (NSAIDs) and supplements. ° °This website has more information on Xarelto®: www.xarelto.com. ° ° °

## 2019-03-18 NOTE — TOC Transition Note (Signed)
Transition of Care [facility]) - CM/SW Discharge Note   Patient Details  Name: SHALVA ROZYCKI MRN: [id number] Date of Birth: May 04, 1934  Transition of Care [facility]) CM/SW Contact:  Gwenlyn Fudge, LCSWA Phone Number: 03/18/2019, 2:48 PM   Clinical Narrative:     Patient will DC to: [facility] Anticipated DC date: 03/18/2019 Family notified: Yes Transport by: Sharin Mons   Per MD patient ready for DC to . RN, patient, patient's family, and facility notified of DC. Discharge Summary and FL2 sent to facility. RN to call report prior to discharge [phone]; Room 3205). DC packet on chart. Ambulance transport requested for patient.   CSW will sign off for now as social work intervention is no longer needed. Please consult [place] again if new needs arise.  Cassundra Mckeever, LCSW-A [place]/Clinical Social Work Department Cell: [phone]   Final next level of care: Skilled Nursing Facility Barriers to Discharge: No Barriers Identified   Patient Goals and CMS Choice Patient states their goals for this hospitalization and ongoing recovery are:: Pt will transition to SNF CMS Medicare.gov Compare Post Acute Care list provided to:: Patient Represenative (must comment) Choice offered to / list presented to : Spouse  Discharge Placement   Existing PASRR number confirmed : 03/17/19          Patient chooses bed at: [facility] Patient to be transferred to facility by: [facility] Name of family member notified: Ivar Drape Patient and family notified of of transfer: 03/18/19  Discharge Plan and Services In-house Referral: Clinical Social Work Discharge Planning Services: [employer] Consult Post Acute Care Choice: Skilled Nursing Facility          DME Arranged: N/A DME Agency: NA       HH Arranged: NA HH Agency: NA        Social Determinants of Health (SDOH) Interventions     Readmission Risk Interventions No flowsheet data found.

## 2019-03-20 ENCOUNTER — Other Ambulatory Visit: Payer: Self-pay | Admitting: *Deleted

## 2019-03-20 ENCOUNTER — Encounter: Payer: Self-pay | Admitting: *Deleted

## 2019-03-20 LAB — CULTURE, BLOOD (ROUTINE X 2)
Culture: NO GROWTH
Culture: NO GROWTH

## 2019-03-20 NOTE — Patient Outreach (Signed)
[facility] Customer service manager [facility]) Care Management [facility] CM Telephone [place], [place] Red-Alert notification/ General Discharge  03/20/2019  Courtney Day [birth year] [id number]  EMMI Red Alert- General discharge EMMI call date/ day #:  March 18, 2019; day # 1 Red-Alert Reason(s):   -- no discharge papers -- does not know who to call for changes in condition -- no scheduled follow up  Received EMMI Red- Alert referral/ General Discharge this morning from [facility] CMA as above; patient was recently hospitalized January 20-23, 2021 for AKI/ metabolic encephalopathy related to dehydration; patient was admitted to hospital from SNF where she is a permanent resident; Patient has history including, but not limited to, HTN and alzheimers dementia.  Brief review of EMR confirms that patient was discharged back to SNF at time of hospital discharge where she will remain a permanent resident; call deferred accordingly  Notified [facility] CMA to request that EMMI automated calls to patient be stopped  Caryl Pina, RN, BSN, [employer] Coordinator [facility] Care Management  [phone]

## 2019-03-22 ENCOUNTER — Other Ambulatory Visit: Payer: Self-pay | Admitting: *Deleted

## 2019-03-22 NOTE — Patient Outreach (Signed)
Member screened for potential [facility] Care Management needs as a benefit of [employer] Medicare.  Courtney Day is currently receiving skilled therapy at [facility] SNF. She has been a long term resident of [facility].   Will continue to follow for potential [facility] needs while member resides in skilled care at [facility].    Raiford Noble, MSN-Ed, RN,BSN [facility] Post Acute Care Coordinator [phone] [facility]) [phone]  (Toll free office)

## 2019-03-29 DIAGNOSIS — Z23 Encounter for immunization: Secondary | ICD-10-CM | POA: Diagnosis not present

## 2019-04-07 DIAGNOSIS — Q159 Congenital malformation of eye, unspecified: Secondary | ICD-10-CM | POA: Diagnosis not present

## 2019-04-07 DIAGNOSIS — I10 Essential (primary) hypertension: Secondary | ICD-10-CM | POA: Diagnosis not present

## 2019-04-07 DIAGNOSIS — G309 Alzheimer's disease, unspecified: Secondary | ICD-10-CM | POA: Diagnosis not present

## 2019-04-07 DIAGNOSIS — I82409 Acute embolism and thrombosis of unspecified deep veins of unspecified lower extremity: Secondary | ICD-10-CM | POA: Diagnosis not present

## 2019-04-12 ENCOUNTER — Other Ambulatory Visit: Payer: Self-pay | Admitting: *Deleted

## 2019-04-12 NOTE — Patient Outreach (Signed)
Member screened for potential [facility] Care Management needs as a benefit of [employer].  Verified in Patient Courtney Day that Courtney Day has transitioned to long term care at [facility] SNF.    Raiford Noble, MSN-Ed, RN,BSN [facility] Post Acute Care Coordinator [phone] [facility]) [phone]  (Toll free office)

## 2019-05-05 DIAGNOSIS — I10 Essential (primary) hypertension: Secondary | ICD-10-CM | POA: Diagnosis not present

## 2019-05-05 DIAGNOSIS — G309 Alzheimer's disease, unspecified: Secondary | ICD-10-CM | POA: Diagnosis not present

## 2019-05-05 DIAGNOSIS — I82409 Acute embolism and thrombosis of unspecified deep veins of unspecified lower extremity: Secondary | ICD-10-CM | POA: Diagnosis not present

## 2019-05-05 DIAGNOSIS — Q159 Congenital malformation of eye, unspecified: Secondary | ICD-10-CM | POA: Diagnosis not present

## 2019-06-09 DIAGNOSIS — I82409 Acute embolism and thrombosis of unspecified deep veins of unspecified lower extremity: Secondary | ICD-10-CM | POA: Diagnosis not present

## 2019-06-09 DIAGNOSIS — I10 Essential (primary) hypertension: Secondary | ICD-10-CM | POA: Diagnosis not present

## 2019-06-09 DIAGNOSIS — Q159 Congenital malformation of eye, unspecified: Secondary | ICD-10-CM | POA: Diagnosis not present

## 2019-06-09 DIAGNOSIS — G309 Alzheimer's disease, unspecified: Secondary | ICD-10-CM | POA: Diagnosis not present

## 2019-06-25 DIAGNOSIS — Z03818 Encounter for observation for suspected exposure to other biological agents ruled out: Secondary | ICD-10-CM | POA: Diagnosis not present

## 2019-06-26 DIAGNOSIS — N178 Other acute kidney failure: Secondary | ICD-10-CM | POA: Diagnosis not present

## 2019-06-26 DIAGNOSIS — G309 Alzheimer's disease, unspecified: Secondary | ICD-10-CM | POA: Diagnosis not present

## 2019-06-26 DIAGNOSIS — R2681 Unsteadiness on feet: Secondary | ICD-10-CM | POA: Diagnosis not present

## 2019-06-26 DIAGNOSIS — G9341 Metabolic encephalopathy: Secondary | ICD-10-CM | POA: Diagnosis not present

## 2019-06-26 DIAGNOSIS — E46 Unspecified protein-calorie malnutrition: Secondary | ICD-10-CM | POA: Diagnosis not present

## 2019-06-26 DIAGNOSIS — I82409 Acute embolism and thrombosis of unspecified deep veins of unspecified lower extremity: Secondary | ICD-10-CM | POA: Diagnosis not present

## 2019-06-26 DIAGNOSIS — R2689 Other abnormalities of gait and mobility: Secondary | ICD-10-CM | POA: Diagnosis not present

## 2019-06-26 DIAGNOSIS — M6281 Muscle weakness (generalized): Secondary | ICD-10-CM | POA: Diagnosis not present

## 2019-06-26 DIAGNOSIS — I10 Essential (primary) hypertension: Secondary | ICD-10-CM | POA: Diagnosis not present

## 2019-06-26 DIAGNOSIS — N179 Acute kidney failure, unspecified: Secondary | ICD-10-CM | POA: Diagnosis not present

## 2019-06-26 DIAGNOSIS — R279 Unspecified lack of coordination: Secondary | ICD-10-CM | POA: Diagnosis not present

## 2019-06-26 DIAGNOSIS — M1990 Unspecified osteoarthritis, unspecified site: Secondary | ICD-10-CM | POA: Diagnosis not present

## 2019-06-27 DIAGNOSIS — G9341 Metabolic encephalopathy: Secondary | ICD-10-CM | POA: Diagnosis not present

## 2019-06-27 DIAGNOSIS — R2681 Unsteadiness on feet: Secondary | ICD-10-CM | POA: Diagnosis not present

## 2019-06-27 DIAGNOSIS — R2689 Other abnormalities of gait and mobility: Secondary | ICD-10-CM | POA: Diagnosis not present

## 2019-06-27 DIAGNOSIS — N178 Other acute kidney failure: Secondary | ICD-10-CM | POA: Diagnosis not present

## 2019-06-27 DIAGNOSIS — M6281 Muscle weakness (generalized): Secondary | ICD-10-CM | POA: Diagnosis not present

## 2019-06-27 DIAGNOSIS — R279 Unspecified lack of coordination: Secondary | ICD-10-CM | POA: Diagnosis not present

## 2019-06-28 DIAGNOSIS — N178 Other acute kidney failure: Secondary | ICD-10-CM | POA: Diagnosis not present

## 2019-06-28 DIAGNOSIS — R2681 Unsteadiness on feet: Secondary | ICD-10-CM | POA: Diagnosis not present

## 2019-06-28 DIAGNOSIS — M6281 Muscle weakness (generalized): Secondary | ICD-10-CM | POA: Diagnosis not present

## 2019-06-28 DIAGNOSIS — R279 Unspecified lack of coordination: Secondary | ICD-10-CM | POA: Diagnosis not present

## 2019-06-28 DIAGNOSIS — R2689 Other abnormalities of gait and mobility: Secondary | ICD-10-CM | POA: Diagnosis not present

## 2019-06-28 DIAGNOSIS — G9341 Metabolic encephalopathy: Secondary | ICD-10-CM | POA: Diagnosis not present

## 2019-06-29 DIAGNOSIS — R2681 Unsteadiness on feet: Secondary | ICD-10-CM | POA: Diagnosis not present

## 2019-06-29 DIAGNOSIS — R279 Unspecified lack of coordination: Secondary | ICD-10-CM | POA: Diagnosis not present

## 2019-06-29 DIAGNOSIS — G9341 Metabolic encephalopathy: Secondary | ICD-10-CM | POA: Diagnosis not present

## 2019-06-29 DIAGNOSIS — M6281 Muscle weakness (generalized): Secondary | ICD-10-CM | POA: Diagnosis not present

## 2019-06-29 DIAGNOSIS — N178 Other acute kidney failure: Secondary | ICD-10-CM | POA: Diagnosis not present

## 2019-06-29 DIAGNOSIS — R2689 Other abnormalities of gait and mobility: Secondary | ICD-10-CM | POA: Diagnosis not present

## 2019-06-30 DIAGNOSIS — N178 Other acute kidney failure: Secondary | ICD-10-CM | POA: Diagnosis not present

## 2019-06-30 DIAGNOSIS — G9341 Metabolic encephalopathy: Secondary | ICD-10-CM | POA: Diagnosis not present

## 2019-06-30 DIAGNOSIS — R2681 Unsteadiness on feet: Secondary | ICD-10-CM | POA: Diagnosis not present

## 2019-06-30 DIAGNOSIS — M6281 Muscle weakness (generalized): Secondary | ICD-10-CM | POA: Diagnosis not present

## 2019-06-30 DIAGNOSIS — R279 Unspecified lack of coordination: Secondary | ICD-10-CM | POA: Diagnosis not present

## 2019-06-30 DIAGNOSIS — R2689 Other abnormalities of gait and mobility: Secondary | ICD-10-CM | POA: Diagnosis not present

## 2019-07-04 DIAGNOSIS — Z20828 Contact with and (suspected) exposure to other viral communicable diseases: Secondary | ICD-10-CM | POA: Diagnosis not present

## 2019-07-07 DIAGNOSIS — G309 Alzheimer's disease, unspecified: Secondary | ICD-10-CM | POA: Diagnosis not present

## 2019-07-07 DIAGNOSIS — I82409 Acute embolism and thrombosis of unspecified deep veins of unspecified lower extremity: Secondary | ICD-10-CM | POA: Diagnosis not present

## 2019-07-07 DIAGNOSIS — I10 Essential (primary) hypertension: Secondary | ICD-10-CM | POA: Diagnosis not present

## 2019-07-07 DIAGNOSIS — Q159 Congenital malformation of eye, unspecified: Secondary | ICD-10-CM | POA: Diagnosis not present

## 2019-09-13 DIAGNOSIS — R2681 Unsteadiness on feet: Secondary | ICD-10-CM | POA: Diagnosis not present

## 2019-09-13 DIAGNOSIS — R2689 Other abnormalities of gait and mobility: Secondary | ICD-10-CM | POA: Diagnosis not present

## 2019-09-13 DIAGNOSIS — I10 Essential (primary) hypertension: Secondary | ICD-10-CM | POA: Diagnosis not present

## 2019-09-13 DIAGNOSIS — M1990 Unspecified osteoarthritis, unspecified site: Secondary | ICD-10-CM | POA: Diagnosis not present

## 2019-09-13 DIAGNOSIS — M6281 Muscle weakness (generalized): Secondary | ICD-10-CM | POA: Diagnosis not present

## 2019-09-13 DIAGNOSIS — R279 Unspecified lack of coordination: Secondary | ICD-10-CM | POA: Diagnosis not present

## 2019-09-13 DIAGNOSIS — G9341 Metabolic encephalopathy: Secondary | ICD-10-CM | POA: Diagnosis not present

## 2019-09-13 DIAGNOSIS — E46 Unspecified protein-calorie malnutrition: Secondary | ICD-10-CM | POA: Diagnosis not present

## 2019-09-13 DIAGNOSIS — I82409 Acute embolism and thrombosis of unspecified deep veins of unspecified lower extremity: Secondary | ICD-10-CM | POA: Diagnosis not present

## 2019-09-13 DIAGNOSIS — N179 Acute kidney failure, unspecified: Secondary | ICD-10-CM | POA: Diagnosis not present

## 2019-09-13 DIAGNOSIS — N178 Other acute kidney failure: Secondary | ICD-10-CM | POA: Diagnosis not present

## 2019-09-13 DIAGNOSIS — G309 Alzheimer's disease, unspecified: Secondary | ICD-10-CM | POA: Diagnosis not present

## 2019-09-14 DIAGNOSIS — M6281 Muscle weakness (generalized): Secondary | ICD-10-CM | POA: Diagnosis not present

## 2019-09-14 DIAGNOSIS — R2689 Other abnormalities of gait and mobility: Secondary | ICD-10-CM | POA: Diagnosis not present

## 2019-09-14 DIAGNOSIS — R279 Unspecified lack of coordination: Secondary | ICD-10-CM | POA: Diagnosis not present

## 2019-09-14 DIAGNOSIS — G9341 Metabolic encephalopathy: Secondary | ICD-10-CM | POA: Diagnosis not present

## 2019-09-14 DIAGNOSIS — R2681 Unsteadiness on feet: Secondary | ICD-10-CM | POA: Diagnosis not present

## 2019-09-14 DIAGNOSIS — N178 Other acute kidney failure: Secondary | ICD-10-CM | POA: Diagnosis not present

## 2019-09-17 DIAGNOSIS — R2681 Unsteadiness on feet: Secondary | ICD-10-CM | POA: Diagnosis not present

## 2019-09-17 DIAGNOSIS — M6281 Muscle weakness (generalized): Secondary | ICD-10-CM | POA: Diagnosis not present

## 2019-09-17 DIAGNOSIS — R2689 Other abnormalities of gait and mobility: Secondary | ICD-10-CM | POA: Diagnosis not present

## 2019-09-17 DIAGNOSIS — N178 Other acute kidney failure: Secondary | ICD-10-CM | POA: Diagnosis not present

## 2019-09-17 DIAGNOSIS — R279 Unspecified lack of coordination: Secondary | ICD-10-CM | POA: Diagnosis not present

## 2019-09-17 DIAGNOSIS — G9341 Metabolic encephalopathy: Secondary | ICD-10-CM | POA: Diagnosis not present

## 2019-09-18 DIAGNOSIS — G9341 Metabolic encephalopathy: Secondary | ICD-10-CM | POA: Diagnosis not present

## 2019-09-18 DIAGNOSIS — M6281 Muscle weakness (generalized): Secondary | ICD-10-CM | POA: Diagnosis not present

## 2019-09-18 DIAGNOSIS — R2689 Other abnormalities of gait and mobility: Secondary | ICD-10-CM | POA: Diagnosis not present

## 2019-09-18 DIAGNOSIS — R279 Unspecified lack of coordination: Secondary | ICD-10-CM | POA: Diagnosis not present

## 2019-09-18 DIAGNOSIS — R2681 Unsteadiness on feet: Secondary | ICD-10-CM | POA: Diagnosis not present

## 2019-09-18 DIAGNOSIS — N178 Other acute kidney failure: Secondary | ICD-10-CM | POA: Diagnosis not present

## 2019-09-19 DIAGNOSIS — M6281 Muscle weakness (generalized): Secondary | ICD-10-CM | POA: Diagnosis not present

## 2019-09-19 DIAGNOSIS — R279 Unspecified lack of coordination: Secondary | ICD-10-CM | POA: Diagnosis not present

## 2019-09-19 DIAGNOSIS — N178 Other acute kidney failure: Secondary | ICD-10-CM | POA: Diagnosis not present

## 2019-09-19 DIAGNOSIS — G9341 Metabolic encephalopathy: Secondary | ICD-10-CM | POA: Diagnosis not present

## 2019-09-19 DIAGNOSIS — R2681 Unsteadiness on feet: Secondary | ICD-10-CM | POA: Diagnosis not present

## 2019-09-19 DIAGNOSIS — R2689 Other abnormalities of gait and mobility: Secondary | ICD-10-CM | POA: Diagnosis not present

## 2019-09-20 DIAGNOSIS — R2681 Unsteadiness on feet: Secondary | ICD-10-CM | POA: Diagnosis not present

## 2019-09-20 DIAGNOSIS — G9341 Metabolic encephalopathy: Secondary | ICD-10-CM | POA: Diagnosis not present

## 2019-09-20 DIAGNOSIS — N178 Other acute kidney failure: Secondary | ICD-10-CM | POA: Diagnosis not present

## 2019-09-20 DIAGNOSIS — Z03818 Encounter for observation for suspected exposure to other biological agents ruled out: Secondary | ICD-10-CM | POA: Diagnosis not present

## 2019-09-20 DIAGNOSIS — R279 Unspecified lack of coordination: Secondary | ICD-10-CM | POA: Diagnosis not present

## 2019-09-20 DIAGNOSIS — R2689 Other abnormalities of gait and mobility: Secondary | ICD-10-CM | POA: Diagnosis not present

## 2019-09-20 DIAGNOSIS — M6281 Muscle weakness (generalized): Secondary | ICD-10-CM | POA: Diagnosis not present

## 2019-09-21 DIAGNOSIS — R2689 Other abnormalities of gait and mobility: Secondary | ICD-10-CM | POA: Diagnosis not present

## 2019-09-21 DIAGNOSIS — M6281 Muscle weakness (generalized): Secondary | ICD-10-CM | POA: Diagnosis not present

## 2019-09-21 DIAGNOSIS — R2681 Unsteadiness on feet: Secondary | ICD-10-CM | POA: Diagnosis not present

## 2019-09-21 DIAGNOSIS — N178 Other acute kidney failure: Secondary | ICD-10-CM | POA: Diagnosis not present

## 2019-09-21 DIAGNOSIS — G9341 Metabolic encephalopathy: Secondary | ICD-10-CM | POA: Diagnosis not present

## 2019-09-21 DIAGNOSIS — R279 Unspecified lack of coordination: Secondary | ICD-10-CM | POA: Diagnosis not present

## 2019-09-22 DIAGNOSIS — M6281 Muscle weakness (generalized): Secondary | ICD-10-CM | POA: Diagnosis not present

## 2019-09-22 DIAGNOSIS — R2689 Other abnormalities of gait and mobility: Secondary | ICD-10-CM | POA: Diagnosis not present

## 2019-09-22 DIAGNOSIS — N178 Other acute kidney failure: Secondary | ICD-10-CM | POA: Diagnosis not present

## 2019-09-22 DIAGNOSIS — R279 Unspecified lack of coordination: Secondary | ICD-10-CM | POA: Diagnosis not present

## 2019-09-22 DIAGNOSIS — R2681 Unsteadiness on feet: Secondary | ICD-10-CM | POA: Diagnosis not present

## 2019-09-22 DIAGNOSIS — G9341 Metabolic encephalopathy: Secondary | ICD-10-CM | POA: Diagnosis not present

## 2019-09-23 DIAGNOSIS — R2689 Other abnormalities of gait and mobility: Secondary | ICD-10-CM | POA: Diagnosis not present

## 2019-09-23 DIAGNOSIS — M6281 Muscle weakness (generalized): Secondary | ICD-10-CM | POA: Diagnosis not present

## 2019-09-23 DIAGNOSIS — N178 Other acute kidney failure: Secondary | ICD-10-CM | POA: Diagnosis not present

## 2019-09-23 DIAGNOSIS — R2681 Unsteadiness on feet: Secondary | ICD-10-CM | POA: Diagnosis not present

## 2019-09-23 DIAGNOSIS — G9341 Metabolic encephalopathy: Secondary | ICD-10-CM | POA: Diagnosis not present

## 2019-09-23 DIAGNOSIS — R279 Unspecified lack of coordination: Secondary | ICD-10-CM | POA: Diagnosis not present

## 2019-09-25 DIAGNOSIS — E46 Unspecified protein-calorie malnutrition: Secondary | ICD-10-CM | POA: Diagnosis not present

## 2019-09-25 DIAGNOSIS — R2681 Unsteadiness on feet: Secondary | ICD-10-CM | POA: Diagnosis not present

## 2019-09-25 DIAGNOSIS — I10 Essential (primary) hypertension: Secondary | ICD-10-CM | POA: Diagnosis not present

## 2019-09-25 DIAGNOSIS — M1990 Unspecified osteoarthritis, unspecified site: Secondary | ICD-10-CM | POA: Diagnosis not present

## 2019-09-25 DIAGNOSIS — G9341 Metabolic encephalopathy: Secondary | ICD-10-CM | POA: Diagnosis not present

## 2019-09-25 DIAGNOSIS — Z03818 Encounter for observation for suspected exposure to other biological agents ruled out: Secondary | ICD-10-CM | POA: Diagnosis not present

## 2019-09-25 DIAGNOSIS — M6281 Muscle weakness (generalized): Secondary | ICD-10-CM | POA: Diagnosis not present

## 2019-09-25 DIAGNOSIS — I82409 Acute embolism and thrombosis of unspecified deep veins of unspecified lower extremity: Secondary | ICD-10-CM | POA: Diagnosis not present

## 2019-09-25 DIAGNOSIS — G309 Alzheimer's disease, unspecified: Secondary | ICD-10-CM | POA: Diagnosis not present

## 2019-09-25 DIAGNOSIS — R279 Unspecified lack of coordination: Secondary | ICD-10-CM | POA: Diagnosis not present

## 2019-09-25 DIAGNOSIS — R2689 Other abnormalities of gait and mobility: Secondary | ICD-10-CM | POA: Diagnosis not present

## 2019-09-28 DIAGNOSIS — Z20822 Contact with and (suspected) exposure to covid-19: Secondary | ICD-10-CM | POA: Diagnosis not present

## 2019-09-29 DIAGNOSIS — I82409 Acute embolism and thrombosis of unspecified deep veins of unspecified lower extremity: Secondary | ICD-10-CM | POA: Diagnosis not present

## 2019-09-29 DIAGNOSIS — G309 Alzheimer's disease, unspecified: Secondary | ICD-10-CM | POA: Diagnosis not present

## 2019-09-29 DIAGNOSIS — Q159 Congenital malformation of eye, unspecified: Secondary | ICD-10-CM | POA: Diagnosis not present

## 2019-09-29 DIAGNOSIS — I10 Essential (primary) hypertension: Secondary | ICD-10-CM | POA: Diagnosis not present

## 2019-10-02 DIAGNOSIS — Z20822 Contact with and (suspected) exposure to covid-19: Secondary | ICD-10-CM | POA: Diagnosis not present

## 2019-10-05 DIAGNOSIS — Z20822 Contact with and (suspected) exposure to covid-19: Secondary | ICD-10-CM | POA: Diagnosis not present

## 2019-10-09 DIAGNOSIS — Z20822 Contact with and (suspected) exposure to covid-19: Secondary | ICD-10-CM | POA: Diagnosis not present

## 2019-10-16 DIAGNOSIS — Z20822 Contact with and (suspected) exposure to covid-19: Secondary | ICD-10-CM | POA: Diagnosis not present

## 2019-10-23 DIAGNOSIS — Z20828 Contact with and (suspected) exposure to other viral communicable diseases: Secondary | ICD-10-CM | POA: Diagnosis not present

## 2019-10-31 DIAGNOSIS — Z20828 Contact with and (suspected) exposure to other viral communicable diseases: Secondary | ICD-10-CM | POA: Diagnosis not present

## 2019-11-03 DIAGNOSIS — I82409 Acute embolism and thrombosis of unspecified deep veins of unspecified lower extremity: Secondary | ICD-10-CM | POA: Diagnosis not present

## 2019-11-03 DIAGNOSIS — Q159 Congenital malformation of eye, unspecified: Secondary | ICD-10-CM | POA: Diagnosis not present

## 2019-11-03 DIAGNOSIS — G309 Alzheimer's disease, unspecified: Secondary | ICD-10-CM | POA: Diagnosis not present

## 2019-11-03 DIAGNOSIS — I10 Essential (primary) hypertension: Secondary | ICD-10-CM | POA: Diagnosis not present

## 2019-11-06 DIAGNOSIS — Z20828 Contact with and (suspected) exposure to other viral communicable diseases: Secondary | ICD-10-CM | POA: Diagnosis not present

## 2019-11-09 DIAGNOSIS — Z20828 Contact with and (suspected) exposure to other viral communicable diseases: Secondary | ICD-10-CM | POA: Diagnosis not present

## 2019-11-13 DIAGNOSIS — Z20828 Contact with and (suspected) exposure to other viral communicable diseases: Secondary | ICD-10-CM | POA: Diagnosis not present

## 2019-11-18 DIAGNOSIS — E785 Hyperlipidemia, unspecified: Secondary | ICD-10-CM | POA: Diagnosis not present

## 2019-11-18 DIAGNOSIS — D649 Anemia, unspecified: Secondary | ICD-10-CM | POA: Diagnosis not present

## 2019-11-18 DIAGNOSIS — I10 Essential (primary) hypertension: Secondary | ICD-10-CM | POA: Diagnosis not present

## 2019-11-22 DIAGNOSIS — Z20828 Contact with and (suspected) exposure to other viral communicable diseases: Secondary | ICD-10-CM | POA: Diagnosis not present

## 2019-11-27 DIAGNOSIS — Z20828 Contact with and (suspected) exposure to other viral communicable diseases: Secondary | ICD-10-CM | POA: Diagnosis not present

## 2019-12-01 DIAGNOSIS — I10 Essential (primary) hypertension: Secondary | ICD-10-CM | POA: Diagnosis not present

## 2019-12-01 DIAGNOSIS — I82409 Acute embolism and thrombosis of unspecified deep veins of unspecified lower extremity: Secondary | ICD-10-CM | POA: Diagnosis not present

## 2019-12-01 DIAGNOSIS — U071 COVID-19: Secondary | ICD-10-CM | POA: Diagnosis not present

## 2019-12-01 DIAGNOSIS — Q159 Congenital malformation of eye, unspecified: Secondary | ICD-10-CM | POA: Diagnosis not present

## 2019-12-01 DIAGNOSIS — G309 Alzheimer's disease, unspecified: Secondary | ICD-10-CM | POA: Diagnosis not present

## 2019-12-04 DIAGNOSIS — Z20828 Contact with and (suspected) exposure to other viral communicable diseases: Secondary | ICD-10-CM | POA: Diagnosis not present

## 2019-12-07 DIAGNOSIS — Z20828 Contact with and (suspected) exposure to other viral communicable diseases: Secondary | ICD-10-CM | POA: Diagnosis not present

## 2019-12-11 DIAGNOSIS — R279 Unspecified lack of coordination: Secondary | ICD-10-CM | POA: Diagnosis not present

## 2019-12-11 DIAGNOSIS — N178 Other acute kidney failure: Secondary | ICD-10-CM | POA: Diagnosis not present

## 2019-12-11 DIAGNOSIS — M6281 Muscle weakness (generalized): Secondary | ICD-10-CM | POA: Diagnosis not present

## 2019-12-11 DIAGNOSIS — R2689 Other abnormalities of gait and mobility: Secondary | ICD-10-CM | POA: Diagnosis not present

## 2019-12-11 DIAGNOSIS — R2681 Unsteadiness on feet: Secondary | ICD-10-CM | POA: Diagnosis not present

## 2019-12-11 DIAGNOSIS — G9341 Metabolic encephalopathy: Secondary | ICD-10-CM | POA: Diagnosis not present

## 2019-12-11 DIAGNOSIS — I10 Essential (primary) hypertension: Secondary | ICD-10-CM | POA: Diagnosis not present

## 2019-12-11 DIAGNOSIS — E46 Unspecified protein-calorie malnutrition: Secondary | ICD-10-CM | POA: Diagnosis not present

## 2019-12-11 DIAGNOSIS — I82409 Acute embolism and thrombosis of unspecified deep veins of unspecified lower extremity: Secondary | ICD-10-CM | POA: Diagnosis not present

## 2019-12-11 DIAGNOSIS — G309 Alzheimer's disease, unspecified: Secondary | ICD-10-CM | POA: Diagnosis not present

## 2019-12-11 DIAGNOSIS — N179 Acute kidney failure, unspecified: Secondary | ICD-10-CM | POA: Diagnosis not present

## 2019-12-11 DIAGNOSIS — M1990 Unspecified osteoarthritis, unspecified site: Secondary | ICD-10-CM | POA: Diagnosis not present

## 2019-12-11 DIAGNOSIS — Z20828 Contact with and (suspected) exposure to other viral communicable diseases: Secondary | ICD-10-CM | POA: Diagnosis not present

## 2019-12-14 DIAGNOSIS — R2681 Unsteadiness on feet: Secondary | ICD-10-CM | POA: Diagnosis not present

## 2019-12-14 DIAGNOSIS — R2689 Other abnormalities of gait and mobility: Secondary | ICD-10-CM | POA: Diagnosis not present

## 2019-12-14 DIAGNOSIS — N178 Other acute kidney failure: Secondary | ICD-10-CM | POA: Diagnosis not present

## 2019-12-14 DIAGNOSIS — Z20828 Contact with and (suspected) exposure to other viral communicable diseases: Secondary | ICD-10-CM | POA: Diagnosis not present

## 2019-12-14 DIAGNOSIS — R279 Unspecified lack of coordination: Secondary | ICD-10-CM | POA: Diagnosis not present

## 2019-12-14 DIAGNOSIS — M6281 Muscle weakness (generalized): Secondary | ICD-10-CM | POA: Diagnosis not present

## 2019-12-14 DIAGNOSIS — G9341 Metabolic encephalopathy: Secondary | ICD-10-CM | POA: Diagnosis not present

## 2019-12-15 DIAGNOSIS — N178 Other acute kidney failure: Secondary | ICD-10-CM | POA: Diagnosis not present

## 2019-12-15 DIAGNOSIS — R2689 Other abnormalities of gait and mobility: Secondary | ICD-10-CM | POA: Diagnosis not present

## 2019-12-15 DIAGNOSIS — R2681 Unsteadiness on feet: Secondary | ICD-10-CM | POA: Diagnosis not present

## 2019-12-15 DIAGNOSIS — R279 Unspecified lack of coordination: Secondary | ICD-10-CM | POA: Diagnosis not present

## 2019-12-15 DIAGNOSIS — M6281 Muscle weakness (generalized): Secondary | ICD-10-CM | POA: Diagnosis not present

## 2019-12-15 DIAGNOSIS — G9341 Metabolic encephalopathy: Secondary | ICD-10-CM | POA: Diagnosis not present

## 2019-12-16 DIAGNOSIS — R279 Unspecified lack of coordination: Secondary | ICD-10-CM | POA: Diagnosis not present

## 2019-12-16 DIAGNOSIS — G9341 Metabolic encephalopathy: Secondary | ICD-10-CM | POA: Diagnosis not present

## 2019-12-16 DIAGNOSIS — R2689 Other abnormalities of gait and mobility: Secondary | ICD-10-CM | POA: Diagnosis not present

## 2019-12-16 DIAGNOSIS — N178 Other acute kidney failure: Secondary | ICD-10-CM | POA: Diagnosis not present

## 2019-12-16 DIAGNOSIS — R2681 Unsteadiness on feet: Secondary | ICD-10-CM | POA: Diagnosis not present

## 2019-12-16 DIAGNOSIS — M6281 Muscle weakness (generalized): Secondary | ICD-10-CM | POA: Diagnosis not present

## 2019-12-17 DIAGNOSIS — M6281 Muscle weakness (generalized): Secondary | ICD-10-CM | POA: Diagnosis not present

## 2019-12-17 DIAGNOSIS — R2689 Other abnormalities of gait and mobility: Secondary | ICD-10-CM | POA: Diagnosis not present

## 2019-12-17 DIAGNOSIS — N178 Other acute kidney failure: Secondary | ICD-10-CM | POA: Diagnosis not present

## 2019-12-17 DIAGNOSIS — R279 Unspecified lack of coordination: Secondary | ICD-10-CM | POA: Diagnosis not present

## 2019-12-17 DIAGNOSIS — G9341 Metabolic encephalopathy: Secondary | ICD-10-CM | POA: Diagnosis not present

## 2019-12-17 DIAGNOSIS — R2681 Unsteadiness on feet: Secondary | ICD-10-CM | POA: Diagnosis not present

## 2019-12-18 DIAGNOSIS — R2681 Unsteadiness on feet: Secondary | ICD-10-CM | POA: Diagnosis not present

## 2019-12-18 DIAGNOSIS — Z20828 Contact with and (suspected) exposure to other viral communicable diseases: Secondary | ICD-10-CM | POA: Diagnosis not present

## 2019-12-18 DIAGNOSIS — G9341 Metabolic encephalopathy: Secondary | ICD-10-CM | POA: Diagnosis not present

## 2019-12-18 DIAGNOSIS — R2689 Other abnormalities of gait and mobility: Secondary | ICD-10-CM | POA: Diagnosis not present

## 2019-12-18 DIAGNOSIS — M6281 Muscle weakness (generalized): Secondary | ICD-10-CM | POA: Diagnosis not present

## 2019-12-18 DIAGNOSIS — N178 Other acute kidney failure: Secondary | ICD-10-CM | POA: Diagnosis not present

## 2019-12-18 DIAGNOSIS — R279 Unspecified lack of coordination: Secondary | ICD-10-CM | POA: Diagnosis not present

## 2019-12-19 DIAGNOSIS — N178 Other acute kidney failure: Secondary | ICD-10-CM | POA: Diagnosis not present

## 2019-12-19 DIAGNOSIS — R2681 Unsteadiness on feet: Secondary | ICD-10-CM | POA: Diagnosis not present

## 2019-12-19 DIAGNOSIS — M6281 Muscle weakness (generalized): Secondary | ICD-10-CM | POA: Diagnosis not present

## 2019-12-19 DIAGNOSIS — R2689 Other abnormalities of gait and mobility: Secondary | ICD-10-CM | POA: Diagnosis not present

## 2019-12-19 DIAGNOSIS — R279 Unspecified lack of coordination: Secondary | ICD-10-CM | POA: Diagnosis not present

## 2019-12-19 DIAGNOSIS — G9341 Metabolic encephalopathy: Secondary | ICD-10-CM | POA: Diagnosis not present

## 2019-12-20 DIAGNOSIS — R2681 Unsteadiness on feet: Secondary | ICD-10-CM | POA: Diagnosis not present

## 2019-12-20 DIAGNOSIS — G9341 Metabolic encephalopathy: Secondary | ICD-10-CM | POA: Diagnosis not present

## 2019-12-20 DIAGNOSIS — R2689 Other abnormalities of gait and mobility: Secondary | ICD-10-CM | POA: Diagnosis not present

## 2019-12-20 DIAGNOSIS — N178 Other acute kidney failure: Secondary | ICD-10-CM | POA: Diagnosis not present

## 2019-12-20 DIAGNOSIS — R279 Unspecified lack of coordination: Secondary | ICD-10-CM | POA: Diagnosis not present

## 2019-12-20 DIAGNOSIS — M6281 Muscle weakness (generalized): Secondary | ICD-10-CM | POA: Diagnosis not present

## 2019-12-21 DIAGNOSIS — R279 Unspecified lack of coordination: Secondary | ICD-10-CM | POA: Diagnosis not present

## 2019-12-21 DIAGNOSIS — M6281 Muscle weakness (generalized): Secondary | ICD-10-CM | POA: Diagnosis not present

## 2019-12-21 DIAGNOSIS — Z20828 Contact with and (suspected) exposure to other viral communicable diseases: Secondary | ICD-10-CM | POA: Diagnosis not present

## 2019-12-21 DIAGNOSIS — G9341 Metabolic encephalopathy: Secondary | ICD-10-CM | POA: Diagnosis not present

## 2019-12-21 DIAGNOSIS — R2681 Unsteadiness on feet: Secondary | ICD-10-CM | POA: Diagnosis not present

## 2019-12-21 DIAGNOSIS — N178 Other acute kidney failure: Secondary | ICD-10-CM | POA: Diagnosis not present

## 2019-12-21 DIAGNOSIS — R2689 Other abnormalities of gait and mobility: Secondary | ICD-10-CM | POA: Diagnosis not present

## 2019-12-22 DIAGNOSIS — R2681 Unsteadiness on feet: Secondary | ICD-10-CM | POA: Diagnosis not present

## 2019-12-22 DIAGNOSIS — N178 Other acute kidney failure: Secondary | ICD-10-CM | POA: Diagnosis not present

## 2019-12-22 DIAGNOSIS — M6281 Muscle weakness (generalized): Secondary | ICD-10-CM | POA: Diagnosis not present

## 2019-12-22 DIAGNOSIS — R279 Unspecified lack of coordination: Secondary | ICD-10-CM | POA: Diagnosis not present

## 2019-12-22 DIAGNOSIS — R2689 Other abnormalities of gait and mobility: Secondary | ICD-10-CM | POA: Diagnosis not present

## 2019-12-22 DIAGNOSIS — G9341 Metabolic encephalopathy: Secondary | ICD-10-CM | POA: Diagnosis not present

## 2019-12-24 DIAGNOSIS — Z20828 Contact with and (suspected) exposure to other viral communicable diseases: Secondary | ICD-10-CM | POA: Diagnosis not present

## 2019-12-25 DIAGNOSIS — I82409 Acute embolism and thrombosis of unspecified deep veins of unspecified lower extremity: Secondary | ICD-10-CM | POA: Diagnosis not present

## 2019-12-25 DIAGNOSIS — E46 Unspecified protein-calorie malnutrition: Secondary | ICD-10-CM | POA: Diagnosis not present

## 2019-12-25 DIAGNOSIS — N179 Acute kidney failure, unspecified: Secondary | ICD-10-CM | POA: Diagnosis not present

## 2019-12-25 DIAGNOSIS — N178 Other acute kidney failure: Secondary | ICD-10-CM | POA: Diagnosis not present

## 2019-12-25 DIAGNOSIS — G309 Alzheimer's disease, unspecified: Secondary | ICD-10-CM | POA: Diagnosis not present

## 2019-12-25 DIAGNOSIS — R2689 Other abnormalities of gait and mobility: Secondary | ICD-10-CM | POA: Diagnosis not present

## 2019-12-25 DIAGNOSIS — I10 Essential (primary) hypertension: Secondary | ICD-10-CM | POA: Diagnosis not present

## 2019-12-25 DIAGNOSIS — M1990 Unspecified osteoarthritis, unspecified site: Secondary | ICD-10-CM | POA: Diagnosis not present

## 2019-12-25 DIAGNOSIS — G9341 Metabolic encephalopathy: Secondary | ICD-10-CM | POA: Diagnosis not present

## 2019-12-25 DIAGNOSIS — R279 Unspecified lack of coordination: Secondary | ICD-10-CM | POA: Diagnosis not present

## 2019-12-25 DIAGNOSIS — R2681 Unsteadiness on feet: Secondary | ICD-10-CM | POA: Diagnosis not present

## 2019-12-25 DIAGNOSIS — M6281 Muscle weakness (generalized): Secondary | ICD-10-CM | POA: Diagnosis not present

## 2019-12-26 DIAGNOSIS — R2681 Unsteadiness on feet: Secondary | ICD-10-CM | POA: Diagnosis not present

## 2019-12-26 DIAGNOSIS — R279 Unspecified lack of coordination: Secondary | ICD-10-CM | POA: Diagnosis not present

## 2019-12-26 DIAGNOSIS — N178 Other acute kidney failure: Secondary | ICD-10-CM | POA: Diagnosis not present

## 2019-12-26 DIAGNOSIS — G9341 Metabolic encephalopathy: Secondary | ICD-10-CM | POA: Diagnosis not present

## 2019-12-26 DIAGNOSIS — M6281 Muscle weakness (generalized): Secondary | ICD-10-CM | POA: Diagnosis not present

## 2019-12-26 DIAGNOSIS — R2689 Other abnormalities of gait and mobility: Secondary | ICD-10-CM | POA: Diagnosis not present

## 2019-12-27 DIAGNOSIS — G9341 Metabolic encephalopathy: Secondary | ICD-10-CM | POA: Diagnosis not present

## 2019-12-27 DIAGNOSIS — M6281 Muscle weakness (generalized): Secondary | ICD-10-CM | POA: Diagnosis not present

## 2019-12-27 DIAGNOSIS — R2681 Unsteadiness on feet: Secondary | ICD-10-CM | POA: Diagnosis not present

## 2019-12-27 DIAGNOSIS — R279 Unspecified lack of coordination: Secondary | ICD-10-CM | POA: Diagnosis not present

## 2019-12-27 DIAGNOSIS — R2689 Other abnormalities of gait and mobility: Secondary | ICD-10-CM | POA: Diagnosis not present

## 2019-12-27 DIAGNOSIS — N178 Other acute kidney failure: Secondary | ICD-10-CM | POA: Diagnosis not present

## 2019-12-28 DIAGNOSIS — R2681 Unsteadiness on feet: Secondary | ICD-10-CM | POA: Diagnosis not present

## 2019-12-28 DIAGNOSIS — R279 Unspecified lack of coordination: Secondary | ICD-10-CM | POA: Diagnosis not present

## 2019-12-28 DIAGNOSIS — G9341 Metabolic encephalopathy: Secondary | ICD-10-CM | POA: Diagnosis not present

## 2019-12-28 DIAGNOSIS — M6281 Muscle weakness (generalized): Secondary | ICD-10-CM | POA: Diagnosis not present

## 2019-12-28 DIAGNOSIS — N178 Other acute kidney failure: Secondary | ICD-10-CM | POA: Diagnosis not present

## 2019-12-28 DIAGNOSIS — R2689 Other abnormalities of gait and mobility: Secondary | ICD-10-CM | POA: Diagnosis not present

## 2019-12-29 DIAGNOSIS — Q159 Congenital malformation of eye, unspecified: Secondary | ICD-10-CM | POA: Diagnosis not present

## 2019-12-29 DIAGNOSIS — G9341 Metabolic encephalopathy: Secondary | ICD-10-CM | POA: Diagnosis not present

## 2019-12-29 DIAGNOSIS — R2689 Other abnormalities of gait and mobility: Secondary | ICD-10-CM | POA: Diagnosis not present

## 2019-12-29 DIAGNOSIS — R279 Unspecified lack of coordination: Secondary | ICD-10-CM | POA: Diagnosis not present

## 2019-12-29 DIAGNOSIS — G309 Alzheimer's disease, unspecified: Secondary | ICD-10-CM | POA: Diagnosis not present

## 2019-12-29 DIAGNOSIS — U071 COVID-19: Secondary | ICD-10-CM | POA: Diagnosis not present

## 2019-12-29 DIAGNOSIS — I10 Essential (primary) hypertension: Secondary | ICD-10-CM | POA: Diagnosis not present

## 2019-12-29 DIAGNOSIS — M6281 Muscle weakness (generalized): Secondary | ICD-10-CM | POA: Diagnosis not present

## 2019-12-29 DIAGNOSIS — I82409 Acute embolism and thrombosis of unspecified deep veins of unspecified lower extremity: Secondary | ICD-10-CM | POA: Diagnosis not present

## 2019-12-29 DIAGNOSIS — R2681 Unsteadiness on feet: Secondary | ICD-10-CM | POA: Diagnosis not present

## 2019-12-29 DIAGNOSIS — N178 Other acute kidney failure: Secondary | ICD-10-CM | POA: Diagnosis not present

## 2020-01-01 DIAGNOSIS — R279 Unspecified lack of coordination: Secondary | ICD-10-CM | POA: Diagnosis not present

## 2020-01-01 DIAGNOSIS — R2689 Other abnormalities of gait and mobility: Secondary | ICD-10-CM | POA: Diagnosis not present

## 2020-01-01 DIAGNOSIS — M6281 Muscle weakness (generalized): Secondary | ICD-10-CM | POA: Diagnosis not present

## 2020-01-01 DIAGNOSIS — R2681 Unsteadiness on feet: Secondary | ICD-10-CM | POA: Diagnosis not present

## 2020-01-01 DIAGNOSIS — N178 Other acute kidney failure: Secondary | ICD-10-CM | POA: Diagnosis not present

## 2020-01-01 DIAGNOSIS — G9341 Metabolic encephalopathy: Secondary | ICD-10-CM | POA: Diagnosis not present

## 2020-01-02 DIAGNOSIS — G9341 Metabolic encephalopathy: Secondary | ICD-10-CM | POA: Diagnosis not present

## 2020-01-02 DIAGNOSIS — R279 Unspecified lack of coordination: Secondary | ICD-10-CM | POA: Diagnosis not present

## 2020-01-02 DIAGNOSIS — N178 Other acute kidney failure: Secondary | ICD-10-CM | POA: Diagnosis not present

## 2020-01-02 DIAGNOSIS — R2681 Unsteadiness on feet: Secondary | ICD-10-CM | POA: Diagnosis not present

## 2020-01-02 DIAGNOSIS — M6281 Muscle weakness (generalized): Secondary | ICD-10-CM | POA: Diagnosis not present

## 2020-01-02 DIAGNOSIS — R2689 Other abnormalities of gait and mobility: Secondary | ICD-10-CM | POA: Diagnosis not present

## 2020-01-03 DIAGNOSIS — G9341 Metabolic encephalopathy: Secondary | ICD-10-CM | POA: Diagnosis not present

## 2020-01-03 DIAGNOSIS — M6281 Muscle weakness (generalized): Secondary | ICD-10-CM | POA: Diagnosis not present

## 2020-01-03 DIAGNOSIS — N178 Other acute kidney failure: Secondary | ICD-10-CM | POA: Diagnosis not present

## 2020-01-03 DIAGNOSIS — R279 Unspecified lack of coordination: Secondary | ICD-10-CM | POA: Diagnosis not present

## 2020-01-03 DIAGNOSIS — R2689 Other abnormalities of gait and mobility: Secondary | ICD-10-CM | POA: Diagnosis not present

## 2020-01-03 DIAGNOSIS — R2681 Unsteadiness on feet: Secondary | ICD-10-CM | POA: Diagnosis not present

## 2020-01-04 DIAGNOSIS — R279 Unspecified lack of coordination: Secondary | ICD-10-CM | POA: Diagnosis not present

## 2020-01-04 DIAGNOSIS — G9341 Metabolic encephalopathy: Secondary | ICD-10-CM | POA: Diagnosis not present

## 2020-01-04 DIAGNOSIS — R2689 Other abnormalities of gait and mobility: Secondary | ICD-10-CM | POA: Diagnosis not present

## 2020-01-04 DIAGNOSIS — N178 Other acute kidney failure: Secondary | ICD-10-CM | POA: Diagnosis not present

## 2020-01-04 DIAGNOSIS — M6281 Muscle weakness (generalized): Secondary | ICD-10-CM | POA: Diagnosis not present

## 2020-01-04 DIAGNOSIS — R2681 Unsteadiness on feet: Secondary | ICD-10-CM | POA: Diagnosis not present

## 2020-01-05 DIAGNOSIS — R279 Unspecified lack of coordination: Secondary | ICD-10-CM | POA: Diagnosis not present

## 2020-01-05 DIAGNOSIS — R2681 Unsteadiness on feet: Secondary | ICD-10-CM | POA: Diagnosis not present

## 2020-01-05 DIAGNOSIS — N178 Other acute kidney failure: Secondary | ICD-10-CM | POA: Diagnosis not present

## 2020-01-05 DIAGNOSIS — G9341 Metabolic encephalopathy: Secondary | ICD-10-CM | POA: Diagnosis not present

## 2020-01-05 DIAGNOSIS — R2689 Other abnormalities of gait and mobility: Secondary | ICD-10-CM | POA: Diagnosis not present

## 2020-01-05 DIAGNOSIS — M6281 Muscle weakness (generalized): Secondary | ICD-10-CM | POA: Diagnosis not present

## 2020-01-08 DIAGNOSIS — R2681 Unsteadiness on feet: Secondary | ICD-10-CM | POA: Diagnosis not present

## 2020-01-08 DIAGNOSIS — R2689 Other abnormalities of gait and mobility: Secondary | ICD-10-CM | POA: Diagnosis not present

## 2020-01-08 DIAGNOSIS — R279 Unspecified lack of coordination: Secondary | ICD-10-CM | POA: Diagnosis not present

## 2020-01-08 DIAGNOSIS — M6281 Muscle weakness (generalized): Secondary | ICD-10-CM | POA: Diagnosis not present

## 2020-01-08 DIAGNOSIS — N178 Other acute kidney failure: Secondary | ICD-10-CM | POA: Diagnosis not present

## 2020-01-08 DIAGNOSIS — G9341 Metabolic encephalopathy: Secondary | ICD-10-CM | POA: Diagnosis not present

## 2020-01-09 DIAGNOSIS — R2681 Unsteadiness on feet: Secondary | ICD-10-CM | POA: Diagnosis not present

## 2020-01-09 DIAGNOSIS — M6281 Muscle weakness (generalized): Secondary | ICD-10-CM | POA: Diagnosis not present

## 2020-01-09 DIAGNOSIS — N178 Other acute kidney failure: Secondary | ICD-10-CM | POA: Diagnosis not present

## 2020-01-09 DIAGNOSIS — G9341 Metabolic encephalopathy: Secondary | ICD-10-CM | POA: Diagnosis not present

## 2020-01-09 DIAGNOSIS — R279 Unspecified lack of coordination: Secondary | ICD-10-CM | POA: Diagnosis not present

## 2020-01-09 DIAGNOSIS — R2689 Other abnormalities of gait and mobility: Secondary | ICD-10-CM | POA: Diagnosis not present

## 2020-01-10 DIAGNOSIS — G9341 Metabolic encephalopathy: Secondary | ICD-10-CM | POA: Diagnosis not present

## 2020-01-10 DIAGNOSIS — R2681 Unsteadiness on feet: Secondary | ICD-10-CM | POA: Diagnosis not present

## 2020-01-10 DIAGNOSIS — N178 Other acute kidney failure: Secondary | ICD-10-CM | POA: Diagnosis not present

## 2020-01-10 DIAGNOSIS — M6281 Muscle weakness (generalized): Secondary | ICD-10-CM | POA: Diagnosis not present

## 2020-01-10 DIAGNOSIS — R2689 Other abnormalities of gait and mobility: Secondary | ICD-10-CM | POA: Diagnosis not present

## 2020-01-10 DIAGNOSIS — R279 Unspecified lack of coordination: Secondary | ICD-10-CM | POA: Diagnosis not present

## 2020-01-11 DIAGNOSIS — G9341 Metabolic encephalopathy: Secondary | ICD-10-CM | POA: Diagnosis not present

## 2020-01-11 DIAGNOSIS — R2681 Unsteadiness on feet: Secondary | ICD-10-CM | POA: Diagnosis not present

## 2020-01-11 DIAGNOSIS — M6281 Muscle weakness (generalized): Secondary | ICD-10-CM | POA: Diagnosis not present

## 2020-01-11 DIAGNOSIS — R2689 Other abnormalities of gait and mobility: Secondary | ICD-10-CM | POA: Diagnosis not present

## 2020-01-11 DIAGNOSIS — N178 Other acute kidney failure: Secondary | ICD-10-CM | POA: Diagnosis not present

## 2020-01-11 DIAGNOSIS — R279 Unspecified lack of coordination: Secondary | ICD-10-CM | POA: Diagnosis not present

## 2020-01-26 DIAGNOSIS — U071 COVID-19: Secondary | ICD-10-CM | POA: Diagnosis not present

## 2020-01-26 DIAGNOSIS — I82409 Acute embolism and thrombosis of unspecified deep veins of unspecified lower extremity: Secondary | ICD-10-CM | POA: Diagnosis not present

## 2020-01-26 DIAGNOSIS — G309 Alzheimer's disease, unspecified: Secondary | ICD-10-CM | POA: Diagnosis not present

## 2020-01-26 DIAGNOSIS — Q159 Congenital malformation of eye, unspecified: Secondary | ICD-10-CM | POA: Diagnosis not present

## 2020-01-26 DIAGNOSIS — I10 Essential (primary) hypertension: Secondary | ICD-10-CM | POA: Diagnosis not present

## 2020-01-30 DIAGNOSIS — Z20822 Contact with and (suspected) exposure to covid-19: Secondary | ICD-10-CM | POA: Diagnosis not present

## 2020-02-01 DIAGNOSIS — Z20822 Contact with and (suspected) exposure to covid-19: Secondary | ICD-10-CM | POA: Diagnosis not present

## 2020-03-08 DIAGNOSIS — G309 Alzheimer's disease, unspecified: Secondary | ICD-10-CM | POA: Diagnosis not present

## 2020-03-08 DIAGNOSIS — I10 Essential (primary) hypertension: Secondary | ICD-10-CM | POA: Diagnosis not present

## 2020-03-08 DIAGNOSIS — I82409 Acute embolism and thrombosis of unspecified deep veins of unspecified lower extremity: Secondary | ICD-10-CM | POA: Diagnosis not present

## 2020-03-08 DIAGNOSIS — U071 COVID-19: Secondary | ICD-10-CM | POA: Diagnosis not present

## 2020-03-08 DIAGNOSIS — Q159 Congenital malformation of eye, unspecified: Secondary | ICD-10-CM | POA: Diagnosis not present

## 2020-03-09 DIAGNOSIS — M1990 Unspecified osteoarthritis, unspecified site: Secondary | ICD-10-CM | POA: Diagnosis not present

## 2020-03-09 DIAGNOSIS — G309 Alzheimer's disease, unspecified: Secondary | ICD-10-CM | POA: Diagnosis not present

## 2020-03-09 DIAGNOSIS — E46 Unspecified protein-calorie malnutrition: Secondary | ICD-10-CM | POA: Diagnosis not present

## 2020-03-09 DIAGNOSIS — I82409 Acute embolism and thrombosis of unspecified deep veins of unspecified lower extremity: Secondary | ICD-10-CM | POA: Diagnosis not present

## 2020-03-09 DIAGNOSIS — I10 Essential (primary) hypertension: Secondary | ICD-10-CM | POA: Diagnosis not present

## 2020-03-09 DIAGNOSIS — M6281 Muscle weakness (generalized): Secondary | ICD-10-CM | POA: Diagnosis not present

## 2020-03-12 DIAGNOSIS — I82409 Acute embolism and thrombosis of unspecified deep veins of unspecified lower extremity: Secondary | ICD-10-CM | POA: Diagnosis not present

## 2020-03-12 DIAGNOSIS — I10 Essential (primary) hypertension: Secondary | ICD-10-CM | POA: Diagnosis not present

## 2020-03-12 DIAGNOSIS — G309 Alzheimer's disease, unspecified: Secondary | ICD-10-CM | POA: Diagnosis not present

## 2020-03-12 DIAGNOSIS — M1990 Unspecified osteoarthritis, unspecified site: Secondary | ICD-10-CM | POA: Diagnosis not present

## 2020-03-12 DIAGNOSIS — E46 Unspecified protein-calorie malnutrition: Secondary | ICD-10-CM | POA: Diagnosis not present

## 2020-03-12 DIAGNOSIS — M6281 Muscle weakness (generalized): Secondary | ICD-10-CM | POA: Diagnosis not present

## 2020-03-13 DIAGNOSIS — I82409 Acute embolism and thrombosis of unspecified deep veins of unspecified lower extremity: Secondary | ICD-10-CM | POA: Diagnosis not present

## 2020-03-13 DIAGNOSIS — G309 Alzheimer's disease, unspecified: Secondary | ICD-10-CM | POA: Diagnosis not present

## 2020-03-13 DIAGNOSIS — M1990 Unspecified osteoarthritis, unspecified site: Secondary | ICD-10-CM | POA: Diagnosis not present

## 2020-03-13 DIAGNOSIS — M6281 Muscle weakness (generalized): Secondary | ICD-10-CM | POA: Diagnosis not present

## 2020-03-13 DIAGNOSIS — I10 Essential (primary) hypertension: Secondary | ICD-10-CM | POA: Diagnosis not present

## 2020-03-13 DIAGNOSIS — E46 Unspecified protein-calorie malnutrition: Secondary | ICD-10-CM | POA: Diagnosis not present

## 2020-03-14 DIAGNOSIS — M6281 Muscle weakness (generalized): Secondary | ICD-10-CM | POA: Diagnosis not present

## 2020-03-14 DIAGNOSIS — M1990 Unspecified osteoarthritis, unspecified site: Secondary | ICD-10-CM | POA: Diagnosis not present

## 2020-03-14 DIAGNOSIS — G309 Alzheimer's disease, unspecified: Secondary | ICD-10-CM | POA: Diagnosis not present

## 2020-03-14 DIAGNOSIS — I82409 Acute embolism and thrombosis of unspecified deep veins of unspecified lower extremity: Secondary | ICD-10-CM | POA: Diagnosis not present

## 2020-03-14 DIAGNOSIS — I10 Essential (primary) hypertension: Secondary | ICD-10-CM | POA: Diagnosis not present

## 2020-03-14 DIAGNOSIS — E46 Unspecified protein-calorie malnutrition: Secondary | ICD-10-CM | POA: Diagnosis not present

## 2020-03-15 DIAGNOSIS — M1990 Unspecified osteoarthritis, unspecified site: Secondary | ICD-10-CM | POA: Diagnosis not present

## 2020-03-15 DIAGNOSIS — E46 Unspecified protein-calorie malnutrition: Secondary | ICD-10-CM | POA: Diagnosis not present

## 2020-03-15 DIAGNOSIS — I10 Essential (primary) hypertension: Secondary | ICD-10-CM | POA: Diagnosis not present

## 2020-03-15 DIAGNOSIS — G309 Alzheimer's disease, unspecified: Secondary | ICD-10-CM | POA: Diagnosis not present

## 2020-03-15 DIAGNOSIS — I82409 Acute embolism and thrombosis of unspecified deep veins of unspecified lower extremity: Secondary | ICD-10-CM | POA: Diagnosis not present

## 2020-03-15 DIAGNOSIS — M6281 Muscle weakness (generalized): Secondary | ICD-10-CM | POA: Diagnosis not present

## 2020-03-16 DIAGNOSIS — I10 Essential (primary) hypertension: Secondary | ICD-10-CM | POA: Diagnosis not present

## 2020-03-16 DIAGNOSIS — M1990 Unspecified osteoarthritis, unspecified site: Secondary | ICD-10-CM | POA: Diagnosis not present

## 2020-03-16 DIAGNOSIS — E46 Unspecified protein-calorie malnutrition: Secondary | ICD-10-CM | POA: Diagnosis not present

## 2020-03-16 DIAGNOSIS — G309 Alzheimer's disease, unspecified: Secondary | ICD-10-CM | POA: Diagnosis not present

## 2020-03-16 DIAGNOSIS — I82409 Acute embolism and thrombosis of unspecified deep veins of unspecified lower extremity: Secondary | ICD-10-CM | POA: Diagnosis not present

## 2020-03-16 DIAGNOSIS — M6281 Muscle weakness (generalized): Secondary | ICD-10-CM | POA: Diagnosis not present

## 2020-03-19 DIAGNOSIS — I82409 Acute embolism and thrombosis of unspecified deep veins of unspecified lower extremity: Secondary | ICD-10-CM | POA: Diagnosis not present

## 2020-03-19 DIAGNOSIS — E46 Unspecified protein-calorie malnutrition: Secondary | ICD-10-CM | POA: Diagnosis not present

## 2020-03-19 DIAGNOSIS — M6281 Muscle weakness (generalized): Secondary | ICD-10-CM | POA: Diagnosis not present

## 2020-03-19 DIAGNOSIS — I10 Essential (primary) hypertension: Secondary | ICD-10-CM | POA: Diagnosis not present

## 2020-03-19 DIAGNOSIS — M1990 Unspecified osteoarthritis, unspecified site: Secondary | ICD-10-CM | POA: Diagnosis not present

## 2020-03-19 DIAGNOSIS — G309 Alzheimer's disease, unspecified: Secondary | ICD-10-CM | POA: Diagnosis not present

## 2020-03-20 DIAGNOSIS — E46 Unspecified protein-calorie malnutrition: Secondary | ICD-10-CM | POA: Diagnosis not present

## 2020-03-20 DIAGNOSIS — G309 Alzheimer's disease, unspecified: Secondary | ICD-10-CM | POA: Diagnosis not present

## 2020-03-20 DIAGNOSIS — M1990 Unspecified osteoarthritis, unspecified site: Secondary | ICD-10-CM | POA: Diagnosis not present

## 2020-03-20 DIAGNOSIS — M6281 Muscle weakness (generalized): Secondary | ICD-10-CM | POA: Diagnosis not present

## 2020-03-20 DIAGNOSIS — I10 Essential (primary) hypertension: Secondary | ICD-10-CM | POA: Diagnosis not present

## 2020-03-20 DIAGNOSIS — I82409 Acute embolism and thrombosis of unspecified deep veins of unspecified lower extremity: Secondary | ICD-10-CM | POA: Diagnosis not present

## 2020-03-21 DIAGNOSIS — G309 Alzheimer's disease, unspecified: Secondary | ICD-10-CM | POA: Diagnosis not present

## 2020-03-21 DIAGNOSIS — I82409 Acute embolism and thrombosis of unspecified deep veins of unspecified lower extremity: Secondary | ICD-10-CM | POA: Diagnosis not present

## 2020-03-21 DIAGNOSIS — M1990 Unspecified osteoarthritis, unspecified site: Secondary | ICD-10-CM | POA: Diagnosis not present

## 2020-03-21 DIAGNOSIS — E46 Unspecified protein-calorie malnutrition: Secondary | ICD-10-CM | POA: Diagnosis not present

## 2020-03-21 DIAGNOSIS — M6281 Muscle weakness (generalized): Secondary | ICD-10-CM | POA: Diagnosis not present

## 2020-03-21 DIAGNOSIS — I10 Essential (primary) hypertension: Secondary | ICD-10-CM | POA: Diagnosis not present

## 2020-03-22 DIAGNOSIS — M6281 Muscle weakness (generalized): Secondary | ICD-10-CM | POA: Diagnosis not present

## 2020-03-22 DIAGNOSIS — E46 Unspecified protein-calorie malnutrition: Secondary | ICD-10-CM | POA: Diagnosis not present

## 2020-03-22 DIAGNOSIS — G309 Alzheimer's disease, unspecified: Secondary | ICD-10-CM | POA: Diagnosis not present

## 2020-03-22 DIAGNOSIS — M1990 Unspecified osteoarthritis, unspecified site: Secondary | ICD-10-CM | POA: Diagnosis not present

## 2020-03-22 DIAGNOSIS — I10 Essential (primary) hypertension: Secondary | ICD-10-CM | POA: Diagnosis not present

## 2020-03-22 DIAGNOSIS — I82409 Acute embolism and thrombosis of unspecified deep veins of unspecified lower extremity: Secondary | ICD-10-CM | POA: Diagnosis not present

## 2020-03-26 DIAGNOSIS — M6281 Muscle weakness (generalized): Secondary | ICD-10-CM | POA: Diagnosis not present

## 2020-03-26 DIAGNOSIS — G309 Alzheimer's disease, unspecified: Secondary | ICD-10-CM | POA: Diagnosis not present

## 2020-03-27 DIAGNOSIS — M6281 Muscle weakness (generalized): Secondary | ICD-10-CM | POA: Diagnosis not present

## 2020-03-27 DIAGNOSIS — G309 Alzheimer's disease, unspecified: Secondary | ICD-10-CM | POA: Diagnosis not present

## 2020-03-28 DIAGNOSIS — G309 Alzheimer's disease, unspecified: Secondary | ICD-10-CM | POA: Diagnosis not present

## 2020-03-28 DIAGNOSIS — M6281 Muscle weakness (generalized): Secondary | ICD-10-CM | POA: Diagnosis not present

## 2020-03-29 DIAGNOSIS — M6281 Muscle weakness (generalized): Secondary | ICD-10-CM | POA: Diagnosis not present

## 2020-03-29 DIAGNOSIS — G309 Alzheimer's disease, unspecified: Secondary | ICD-10-CM | POA: Diagnosis not present

## 2020-03-30 DIAGNOSIS — M6281 Muscle weakness (generalized): Secondary | ICD-10-CM | POA: Diagnosis not present

## 2020-03-30 DIAGNOSIS — G309 Alzheimer's disease, unspecified: Secondary | ICD-10-CM | POA: Diagnosis not present

## 2020-04-02 DIAGNOSIS — M6281 Muscle weakness (generalized): Secondary | ICD-10-CM | POA: Diagnosis not present

## 2020-04-02 DIAGNOSIS — G309 Alzheimer's disease, unspecified: Secondary | ICD-10-CM | POA: Diagnosis not present

## 2020-04-03 DIAGNOSIS — M6281 Muscle weakness (generalized): Secondary | ICD-10-CM | POA: Diagnosis not present

## 2020-04-03 DIAGNOSIS — G309 Alzheimer's disease, unspecified: Secondary | ICD-10-CM | POA: Diagnosis not present

## 2020-04-04 DIAGNOSIS — G309 Alzheimer's disease, unspecified: Secondary | ICD-10-CM | POA: Diagnosis not present

## 2020-04-04 DIAGNOSIS — M6281 Muscle weakness (generalized): Secondary | ICD-10-CM | POA: Diagnosis not present

## 2020-04-05 DIAGNOSIS — I82409 Acute embolism and thrombosis of unspecified deep veins of unspecified lower extremity: Secondary | ICD-10-CM | POA: Diagnosis not present

## 2020-04-05 DIAGNOSIS — U071 COVID-19: Secondary | ICD-10-CM | POA: Diagnosis not present

## 2020-04-05 DIAGNOSIS — Q159 Congenital malformation of eye, unspecified: Secondary | ICD-10-CM | POA: Diagnosis not present

## 2020-04-05 DIAGNOSIS — G309 Alzheimer's disease, unspecified: Secondary | ICD-10-CM | POA: Diagnosis not present

## 2020-04-05 DIAGNOSIS — I10 Essential (primary) hypertension: Secondary | ICD-10-CM | POA: Diagnosis not present

## 2020-04-05 DIAGNOSIS — M6281 Muscle weakness (generalized): Secondary | ICD-10-CM | POA: Diagnosis not present

## 2020-04-09 DIAGNOSIS — M6281 Muscle weakness (generalized): Secondary | ICD-10-CM | POA: Diagnosis not present

## 2020-04-09 DIAGNOSIS — G309 Alzheimer's disease, unspecified: Secondary | ICD-10-CM | POA: Diagnosis not present

## 2020-04-10 DIAGNOSIS — M6281 Muscle weakness (generalized): Secondary | ICD-10-CM | POA: Diagnosis not present

## 2020-04-10 DIAGNOSIS — G309 Alzheimer's disease, unspecified: Secondary | ICD-10-CM | POA: Diagnosis not present

## 2020-04-12 DIAGNOSIS — G309 Alzheimer's disease, unspecified: Secondary | ICD-10-CM | POA: Diagnosis not present

## 2020-04-12 DIAGNOSIS — M6281 Muscle weakness (generalized): Secondary | ICD-10-CM | POA: Diagnosis not present

## 2020-04-13 DIAGNOSIS — M6281 Muscle weakness (generalized): Secondary | ICD-10-CM | POA: Diagnosis not present

## 2020-04-13 DIAGNOSIS — G309 Alzheimer's disease, unspecified: Secondary | ICD-10-CM | POA: Diagnosis not present

## 2020-04-16 DIAGNOSIS — M6281 Muscle weakness (generalized): Secondary | ICD-10-CM | POA: Diagnosis not present

## 2020-04-16 DIAGNOSIS — G309 Alzheimer's disease, unspecified: Secondary | ICD-10-CM | POA: Diagnosis not present

## 2020-04-17 DIAGNOSIS — G309 Alzheimer's disease, unspecified: Secondary | ICD-10-CM | POA: Diagnosis not present

## 2020-04-17 DIAGNOSIS — M6281 Muscle weakness (generalized): Secondary | ICD-10-CM | POA: Diagnosis not present

## 2020-04-19 DIAGNOSIS — G309 Alzheimer's disease, unspecified: Secondary | ICD-10-CM | POA: Diagnosis not present

## 2020-04-19 DIAGNOSIS — M6281 Muscle weakness (generalized): Secondary | ICD-10-CM | POA: Diagnosis not present

## 2020-04-20 DIAGNOSIS — G309 Alzheimer's disease, unspecified: Secondary | ICD-10-CM | POA: Diagnosis not present

## 2020-04-20 DIAGNOSIS — M6281 Muscle weakness (generalized): Secondary | ICD-10-CM | POA: Diagnosis not present

## 2020-04-23 DIAGNOSIS — M1990 Unspecified osteoarthritis, unspecified site: Secondary | ICD-10-CM | POA: Diagnosis not present

## 2020-04-23 DIAGNOSIS — I10 Essential (primary) hypertension: Secondary | ICD-10-CM | POA: Diagnosis not present

## 2020-04-23 DIAGNOSIS — E46 Unspecified protein-calorie malnutrition: Secondary | ICD-10-CM | POA: Diagnosis not present

## 2020-04-23 DIAGNOSIS — M6281 Muscle weakness (generalized): Secondary | ICD-10-CM | POA: Diagnosis not present

## 2020-04-23 DIAGNOSIS — G309 Alzheimer's disease, unspecified: Secondary | ICD-10-CM | POA: Diagnosis not present

## 2020-04-24 DIAGNOSIS — E46 Unspecified protein-calorie malnutrition: Secondary | ICD-10-CM | POA: Diagnosis not present

## 2020-04-24 DIAGNOSIS — M1990 Unspecified osteoarthritis, unspecified site: Secondary | ICD-10-CM | POA: Diagnosis not present

## 2020-04-24 DIAGNOSIS — G309 Alzheimer's disease, unspecified: Secondary | ICD-10-CM | POA: Diagnosis not present

## 2020-04-24 DIAGNOSIS — M6281 Muscle weakness (generalized): Secondary | ICD-10-CM | POA: Diagnosis not present

## 2020-04-24 DIAGNOSIS — I10 Essential (primary) hypertension: Secondary | ICD-10-CM | POA: Diagnosis not present

## 2020-04-25 DIAGNOSIS — M1990 Unspecified osteoarthritis, unspecified site: Secondary | ICD-10-CM | POA: Diagnosis not present

## 2020-04-25 DIAGNOSIS — G309 Alzheimer's disease, unspecified: Secondary | ICD-10-CM | POA: Diagnosis not present

## 2020-04-25 DIAGNOSIS — M6281 Muscle weakness (generalized): Secondary | ICD-10-CM | POA: Diagnosis not present

## 2020-04-25 DIAGNOSIS — I10 Essential (primary) hypertension: Secondary | ICD-10-CM | POA: Diagnosis not present

## 2020-04-25 DIAGNOSIS — E46 Unspecified protein-calorie malnutrition: Secondary | ICD-10-CM | POA: Diagnosis not present

## 2020-04-26 DIAGNOSIS — I10 Essential (primary) hypertension: Secondary | ICD-10-CM | POA: Diagnosis not present

## 2020-04-26 DIAGNOSIS — M6281 Muscle weakness (generalized): Secondary | ICD-10-CM | POA: Diagnosis not present

## 2020-04-26 DIAGNOSIS — E46 Unspecified protein-calorie malnutrition: Secondary | ICD-10-CM | POA: Diagnosis not present

## 2020-04-26 DIAGNOSIS — G309 Alzheimer's disease, unspecified: Secondary | ICD-10-CM | POA: Diagnosis not present

## 2020-04-26 DIAGNOSIS — M1990 Unspecified osteoarthritis, unspecified site: Secondary | ICD-10-CM | POA: Diagnosis not present

## 2020-04-27 DIAGNOSIS — E46 Unspecified protein-calorie malnutrition: Secondary | ICD-10-CM | POA: Diagnosis not present

## 2020-04-27 DIAGNOSIS — M1990 Unspecified osteoarthritis, unspecified site: Secondary | ICD-10-CM | POA: Diagnosis not present

## 2020-04-27 DIAGNOSIS — I10 Essential (primary) hypertension: Secondary | ICD-10-CM | POA: Diagnosis not present

## 2020-04-27 DIAGNOSIS — G309 Alzheimer's disease, unspecified: Secondary | ICD-10-CM | POA: Diagnosis not present

## 2020-04-27 DIAGNOSIS — M6281 Muscle weakness (generalized): Secondary | ICD-10-CM | POA: Diagnosis not present

## 2020-04-30 DIAGNOSIS — I10 Essential (primary) hypertension: Secondary | ICD-10-CM | POA: Diagnosis not present

## 2020-04-30 DIAGNOSIS — E46 Unspecified protein-calorie malnutrition: Secondary | ICD-10-CM | POA: Diagnosis not present

## 2020-04-30 DIAGNOSIS — G309 Alzheimer's disease, unspecified: Secondary | ICD-10-CM | POA: Diagnosis not present

## 2020-04-30 DIAGNOSIS — M6281 Muscle weakness (generalized): Secondary | ICD-10-CM | POA: Diagnosis not present

## 2020-04-30 DIAGNOSIS — M1990 Unspecified osteoarthritis, unspecified site: Secondary | ICD-10-CM | POA: Diagnosis not present

## 2020-05-01 DIAGNOSIS — G309 Alzheimer's disease, unspecified: Secondary | ICD-10-CM | POA: Diagnosis not present

## 2020-05-01 DIAGNOSIS — E46 Unspecified protein-calorie malnutrition: Secondary | ICD-10-CM | POA: Diagnosis not present

## 2020-05-01 DIAGNOSIS — M6281 Muscle weakness (generalized): Secondary | ICD-10-CM | POA: Diagnosis not present

## 2020-05-01 DIAGNOSIS — M1990 Unspecified osteoarthritis, unspecified site: Secondary | ICD-10-CM | POA: Diagnosis not present

## 2020-05-01 DIAGNOSIS — I10 Essential (primary) hypertension: Secondary | ICD-10-CM | POA: Diagnosis not present

## 2020-05-02 DIAGNOSIS — M6281 Muscle weakness (generalized): Secondary | ICD-10-CM | POA: Diagnosis not present

## 2020-05-02 DIAGNOSIS — G309 Alzheimer's disease, unspecified: Secondary | ICD-10-CM | POA: Diagnosis not present

## 2020-05-02 DIAGNOSIS — E46 Unspecified protein-calorie malnutrition: Secondary | ICD-10-CM | POA: Diagnosis not present

## 2020-05-02 DIAGNOSIS — I10 Essential (primary) hypertension: Secondary | ICD-10-CM | POA: Diagnosis not present

## 2020-05-02 DIAGNOSIS — M1990 Unspecified osteoarthritis, unspecified site: Secondary | ICD-10-CM | POA: Diagnosis not present

## 2020-05-03 DIAGNOSIS — I10 Essential (primary) hypertension: Secondary | ICD-10-CM | POA: Diagnosis not present

## 2020-05-03 DIAGNOSIS — M1990 Unspecified osteoarthritis, unspecified site: Secondary | ICD-10-CM | POA: Diagnosis not present

## 2020-05-03 DIAGNOSIS — Q159 Congenital malformation of eye, unspecified: Secondary | ICD-10-CM | POA: Diagnosis not present

## 2020-05-03 DIAGNOSIS — E46 Unspecified protein-calorie malnutrition: Secondary | ICD-10-CM | POA: Diagnosis not present

## 2020-05-03 DIAGNOSIS — U071 COVID-19: Secondary | ICD-10-CM | POA: Diagnosis not present

## 2020-05-03 DIAGNOSIS — I82409 Acute embolism and thrombosis of unspecified deep veins of unspecified lower extremity: Secondary | ICD-10-CM | POA: Diagnosis not present

## 2020-05-03 DIAGNOSIS — M6281 Muscle weakness (generalized): Secondary | ICD-10-CM | POA: Diagnosis not present

## 2020-05-03 DIAGNOSIS — G309 Alzheimer's disease, unspecified: Secondary | ICD-10-CM | POA: Diagnosis not present

## 2020-05-04 DIAGNOSIS — M6281 Muscle weakness (generalized): Secondary | ICD-10-CM | POA: Diagnosis not present

## 2020-05-04 DIAGNOSIS — M1990 Unspecified osteoarthritis, unspecified site: Secondary | ICD-10-CM | POA: Diagnosis not present

## 2020-05-04 DIAGNOSIS — I10 Essential (primary) hypertension: Secondary | ICD-10-CM | POA: Diagnosis not present

## 2020-05-04 DIAGNOSIS — G309 Alzheimer's disease, unspecified: Secondary | ICD-10-CM | POA: Diagnosis not present

## 2020-05-04 DIAGNOSIS — E46 Unspecified protein-calorie malnutrition: Secondary | ICD-10-CM | POA: Diagnosis not present

## 2020-05-07 DIAGNOSIS — G309 Alzheimer's disease, unspecified: Secondary | ICD-10-CM | POA: Diagnosis not present

## 2020-05-07 DIAGNOSIS — E46 Unspecified protein-calorie malnutrition: Secondary | ICD-10-CM | POA: Diagnosis not present

## 2020-05-07 DIAGNOSIS — I10 Essential (primary) hypertension: Secondary | ICD-10-CM | POA: Diagnosis not present

## 2020-05-07 DIAGNOSIS — M1990 Unspecified osteoarthritis, unspecified site: Secondary | ICD-10-CM | POA: Diagnosis not present

## 2020-05-07 DIAGNOSIS — M6281 Muscle weakness (generalized): Secondary | ICD-10-CM | POA: Diagnosis not present

## 2020-05-08 DIAGNOSIS — E46 Unspecified protein-calorie malnutrition: Secondary | ICD-10-CM | POA: Diagnosis not present

## 2020-05-08 DIAGNOSIS — M1990 Unspecified osteoarthritis, unspecified site: Secondary | ICD-10-CM | POA: Diagnosis not present

## 2020-05-08 DIAGNOSIS — G309 Alzheimer's disease, unspecified: Secondary | ICD-10-CM | POA: Diagnosis not present

## 2020-05-08 DIAGNOSIS — I10 Essential (primary) hypertension: Secondary | ICD-10-CM | POA: Diagnosis not present

## 2020-05-08 DIAGNOSIS — M6281 Muscle weakness (generalized): Secondary | ICD-10-CM | POA: Diagnosis not present

## 2020-05-09 DIAGNOSIS — M1990 Unspecified osteoarthritis, unspecified site: Secondary | ICD-10-CM | POA: Diagnosis not present

## 2020-05-09 DIAGNOSIS — E46 Unspecified protein-calorie malnutrition: Secondary | ICD-10-CM | POA: Diagnosis not present

## 2020-05-09 DIAGNOSIS — I10 Essential (primary) hypertension: Secondary | ICD-10-CM | POA: Diagnosis not present

## 2020-05-09 DIAGNOSIS — M6281 Muscle weakness (generalized): Secondary | ICD-10-CM | POA: Diagnosis not present

## 2020-05-09 DIAGNOSIS — G309 Alzheimer's disease, unspecified: Secondary | ICD-10-CM | POA: Diagnosis not present

## 2020-05-10 DIAGNOSIS — I10 Essential (primary) hypertension: Secondary | ICD-10-CM | POA: Diagnosis not present

## 2020-05-10 DIAGNOSIS — G309 Alzheimer's disease, unspecified: Secondary | ICD-10-CM | POA: Diagnosis not present

## 2020-05-10 DIAGNOSIS — M6281 Muscle weakness (generalized): Secondary | ICD-10-CM | POA: Diagnosis not present

## 2020-05-10 DIAGNOSIS — M1990 Unspecified osteoarthritis, unspecified site: Secondary | ICD-10-CM | POA: Diagnosis not present

## 2020-05-10 DIAGNOSIS — E46 Unspecified protein-calorie malnutrition: Secondary | ICD-10-CM | POA: Diagnosis not present

## 2020-05-15 DIAGNOSIS — M1990 Unspecified osteoarthritis, unspecified site: Secondary | ICD-10-CM | POA: Diagnosis not present

## 2020-05-15 DIAGNOSIS — I10 Essential (primary) hypertension: Secondary | ICD-10-CM | POA: Diagnosis not present

## 2020-05-15 DIAGNOSIS — G309 Alzheimer's disease, unspecified: Secondary | ICD-10-CM | POA: Diagnosis not present

## 2020-05-15 DIAGNOSIS — M6281 Muscle weakness (generalized): Secondary | ICD-10-CM | POA: Diagnosis not present

## 2020-05-15 DIAGNOSIS — E46 Unspecified protein-calorie malnutrition: Secondary | ICD-10-CM | POA: Diagnosis not present

## 2020-05-16 DIAGNOSIS — M1990 Unspecified osteoarthritis, unspecified site: Secondary | ICD-10-CM | POA: Diagnosis not present

## 2020-05-16 DIAGNOSIS — M6281 Muscle weakness (generalized): Secondary | ICD-10-CM | POA: Diagnosis not present

## 2020-05-16 DIAGNOSIS — E46 Unspecified protein-calorie malnutrition: Secondary | ICD-10-CM | POA: Diagnosis not present

## 2020-05-16 DIAGNOSIS — G309 Alzheimer's disease, unspecified: Secondary | ICD-10-CM | POA: Diagnosis not present

## 2020-05-16 DIAGNOSIS — I10 Essential (primary) hypertension: Secondary | ICD-10-CM | POA: Diagnosis not present

## 2020-05-18 DIAGNOSIS — I10 Essential (primary) hypertension: Secondary | ICD-10-CM | POA: Diagnosis not present

## 2020-05-18 DIAGNOSIS — M1990 Unspecified osteoarthritis, unspecified site: Secondary | ICD-10-CM | POA: Diagnosis not present

## 2020-05-18 DIAGNOSIS — E46 Unspecified protein-calorie malnutrition: Secondary | ICD-10-CM | POA: Diagnosis not present

## 2020-05-18 DIAGNOSIS — G309 Alzheimer's disease, unspecified: Secondary | ICD-10-CM | POA: Diagnosis not present

## 2020-05-18 DIAGNOSIS — M6281 Muscle weakness (generalized): Secondary | ICD-10-CM | POA: Diagnosis not present

## 2020-05-20 DIAGNOSIS — G309 Alzheimer's disease, unspecified: Secondary | ICD-10-CM | POA: Diagnosis not present

## 2020-05-20 DIAGNOSIS — M6281 Muscle weakness (generalized): Secondary | ICD-10-CM | POA: Diagnosis not present

## 2020-05-20 DIAGNOSIS — M1990 Unspecified osteoarthritis, unspecified site: Secondary | ICD-10-CM | POA: Diagnosis not present

## 2020-05-20 DIAGNOSIS — E46 Unspecified protein-calorie malnutrition: Secondary | ICD-10-CM | POA: Diagnosis not present

## 2020-05-20 DIAGNOSIS — I10 Essential (primary) hypertension: Secondary | ICD-10-CM | POA: Diagnosis not present

## 2020-05-31 DIAGNOSIS — I82409 Acute embolism and thrombosis of unspecified deep veins of unspecified lower extremity: Secondary | ICD-10-CM | POA: Diagnosis not present

## 2020-05-31 DIAGNOSIS — G309 Alzheimer's disease, unspecified: Secondary | ICD-10-CM | POA: Diagnosis not present

## 2020-05-31 DIAGNOSIS — E46 Unspecified protein-calorie malnutrition: Secondary | ICD-10-CM | POA: Diagnosis not present

## 2020-05-31 DIAGNOSIS — R1310 Dysphagia, unspecified: Secondary | ICD-10-CM | POA: Diagnosis not present

## 2020-05-31 DIAGNOSIS — M1990 Unspecified osteoarthritis, unspecified site: Secondary | ICD-10-CM | POA: Diagnosis not present

## 2020-05-31 DIAGNOSIS — R278 Other lack of coordination: Secondary | ICD-10-CM | POA: Diagnosis not present

## 2020-05-31 DIAGNOSIS — R41841 Cognitive communication deficit: Secondary | ICD-10-CM | POA: Diagnosis not present

## 2020-06-03 DIAGNOSIS — E46 Unspecified protein-calorie malnutrition: Secondary | ICD-10-CM | POA: Diagnosis not present

## 2020-06-03 DIAGNOSIS — R41841 Cognitive communication deficit: Secondary | ICD-10-CM | POA: Diagnosis not present

## 2020-06-03 DIAGNOSIS — R278 Other lack of coordination: Secondary | ICD-10-CM | POA: Diagnosis not present

## 2020-06-03 DIAGNOSIS — R1310 Dysphagia, unspecified: Secondary | ICD-10-CM | POA: Diagnosis not present

## 2020-06-03 DIAGNOSIS — M1990 Unspecified osteoarthritis, unspecified site: Secondary | ICD-10-CM | POA: Diagnosis not present

## 2020-06-03 DIAGNOSIS — G309 Alzheimer's disease, unspecified: Secondary | ICD-10-CM | POA: Diagnosis not present

## 2020-06-04 DIAGNOSIS — R278 Other lack of coordination: Secondary | ICD-10-CM | POA: Diagnosis not present

## 2020-06-04 DIAGNOSIS — E46 Unspecified protein-calorie malnutrition: Secondary | ICD-10-CM | POA: Diagnosis not present

## 2020-06-04 DIAGNOSIS — R41841 Cognitive communication deficit: Secondary | ICD-10-CM | POA: Diagnosis not present

## 2020-06-04 DIAGNOSIS — M1990 Unspecified osteoarthritis, unspecified site: Secondary | ICD-10-CM | POA: Diagnosis not present

## 2020-06-04 DIAGNOSIS — G309 Alzheimer's disease, unspecified: Secondary | ICD-10-CM | POA: Diagnosis not present

## 2020-06-04 DIAGNOSIS — R1310 Dysphagia, unspecified: Secondary | ICD-10-CM | POA: Diagnosis not present

## 2020-06-05 DIAGNOSIS — M1990 Unspecified osteoarthritis, unspecified site: Secondary | ICD-10-CM | POA: Diagnosis not present

## 2020-06-05 DIAGNOSIS — G309 Alzheimer's disease, unspecified: Secondary | ICD-10-CM | POA: Diagnosis not present

## 2020-06-05 DIAGNOSIS — R1310 Dysphagia, unspecified: Secondary | ICD-10-CM | POA: Diagnosis not present

## 2020-06-05 DIAGNOSIS — E46 Unspecified protein-calorie malnutrition: Secondary | ICD-10-CM | POA: Diagnosis not present

## 2020-06-05 DIAGNOSIS — R278 Other lack of coordination: Secondary | ICD-10-CM | POA: Diagnosis not present

## 2020-06-05 DIAGNOSIS — R41841 Cognitive communication deficit: Secondary | ICD-10-CM | POA: Diagnosis not present

## 2020-06-06 DIAGNOSIS — E46 Unspecified protein-calorie malnutrition: Secondary | ICD-10-CM | POA: Diagnosis not present

## 2020-06-06 DIAGNOSIS — R1310 Dysphagia, unspecified: Secondary | ICD-10-CM | POA: Diagnosis not present

## 2020-06-06 DIAGNOSIS — R41841 Cognitive communication deficit: Secondary | ICD-10-CM | POA: Diagnosis not present

## 2020-06-06 DIAGNOSIS — G309 Alzheimer's disease, unspecified: Secondary | ICD-10-CM | POA: Diagnosis not present

## 2020-06-06 DIAGNOSIS — R278 Other lack of coordination: Secondary | ICD-10-CM | POA: Diagnosis not present

## 2020-06-06 DIAGNOSIS — M1990 Unspecified osteoarthritis, unspecified site: Secondary | ICD-10-CM | POA: Diagnosis not present

## 2020-06-08 DIAGNOSIS — G309 Alzheimer's disease, unspecified: Secondary | ICD-10-CM | POA: Diagnosis not present

## 2020-06-08 DIAGNOSIS — R278 Other lack of coordination: Secondary | ICD-10-CM | POA: Diagnosis not present

## 2020-06-08 DIAGNOSIS — M1990 Unspecified osteoarthritis, unspecified site: Secondary | ICD-10-CM | POA: Diagnosis not present

## 2020-06-08 DIAGNOSIS — E46 Unspecified protein-calorie malnutrition: Secondary | ICD-10-CM | POA: Diagnosis not present

## 2020-06-08 DIAGNOSIS — R1310 Dysphagia, unspecified: Secondary | ICD-10-CM | POA: Diagnosis not present

## 2020-06-08 DIAGNOSIS — R41841 Cognitive communication deficit: Secondary | ICD-10-CM | POA: Diagnosis not present

## 2020-06-10 DIAGNOSIS — G309 Alzheimer's disease, unspecified: Secondary | ICD-10-CM | POA: Diagnosis not present

## 2020-06-10 DIAGNOSIS — M1990 Unspecified osteoarthritis, unspecified site: Secondary | ICD-10-CM | POA: Diagnosis not present

## 2020-06-10 DIAGNOSIS — R278 Other lack of coordination: Secondary | ICD-10-CM | POA: Diagnosis not present

## 2020-06-10 DIAGNOSIS — R1310 Dysphagia, unspecified: Secondary | ICD-10-CM | POA: Diagnosis not present

## 2020-06-10 DIAGNOSIS — E46 Unspecified protein-calorie malnutrition: Secondary | ICD-10-CM | POA: Diagnosis not present

## 2020-06-10 DIAGNOSIS — R41841 Cognitive communication deficit: Secondary | ICD-10-CM | POA: Diagnosis not present

## 2020-06-11 DIAGNOSIS — M1990 Unspecified osteoarthritis, unspecified site: Secondary | ICD-10-CM | POA: Diagnosis not present

## 2020-06-11 DIAGNOSIS — G309 Alzheimer's disease, unspecified: Secondary | ICD-10-CM | POA: Diagnosis not present

## 2020-06-11 DIAGNOSIS — R1310 Dysphagia, unspecified: Secondary | ICD-10-CM | POA: Diagnosis not present

## 2020-06-11 DIAGNOSIS — R278 Other lack of coordination: Secondary | ICD-10-CM | POA: Diagnosis not present

## 2020-06-11 DIAGNOSIS — E46 Unspecified protein-calorie malnutrition: Secondary | ICD-10-CM | POA: Diagnosis not present

## 2020-06-11 DIAGNOSIS — R41841 Cognitive communication deficit: Secondary | ICD-10-CM | POA: Diagnosis not present

## 2020-06-12 DIAGNOSIS — R41841 Cognitive communication deficit: Secondary | ICD-10-CM | POA: Diagnosis not present

## 2020-06-12 DIAGNOSIS — R278 Other lack of coordination: Secondary | ICD-10-CM | POA: Diagnosis not present

## 2020-06-12 DIAGNOSIS — R1310 Dysphagia, unspecified: Secondary | ICD-10-CM | POA: Diagnosis not present

## 2020-06-12 DIAGNOSIS — G309 Alzheimer's disease, unspecified: Secondary | ICD-10-CM | POA: Diagnosis not present

## 2020-06-12 DIAGNOSIS — M1990 Unspecified osteoarthritis, unspecified site: Secondary | ICD-10-CM | POA: Diagnosis not present

## 2020-06-12 DIAGNOSIS — E46 Unspecified protein-calorie malnutrition: Secondary | ICD-10-CM | POA: Diagnosis not present

## 2020-06-13 DIAGNOSIS — E46 Unspecified protein-calorie malnutrition: Secondary | ICD-10-CM | POA: Diagnosis not present

## 2020-06-13 DIAGNOSIS — G309 Alzheimer's disease, unspecified: Secondary | ICD-10-CM | POA: Diagnosis not present

## 2020-06-13 DIAGNOSIS — R278 Other lack of coordination: Secondary | ICD-10-CM | POA: Diagnosis not present

## 2020-06-13 DIAGNOSIS — M1990 Unspecified osteoarthritis, unspecified site: Secondary | ICD-10-CM | POA: Diagnosis not present

## 2020-06-13 DIAGNOSIS — R41841 Cognitive communication deficit: Secondary | ICD-10-CM | POA: Diagnosis not present

## 2020-06-13 DIAGNOSIS — R1310 Dysphagia, unspecified: Secondary | ICD-10-CM | POA: Diagnosis not present

## 2020-06-14 DIAGNOSIS — R41841 Cognitive communication deficit: Secondary | ICD-10-CM | POA: Diagnosis not present

## 2020-06-14 DIAGNOSIS — I82409 Acute embolism and thrombosis of unspecified deep veins of unspecified lower extremity: Secondary | ICD-10-CM | POA: Diagnosis not present

## 2020-06-14 DIAGNOSIS — M1990 Unspecified osteoarthritis, unspecified site: Secondary | ICD-10-CM | POA: Diagnosis not present

## 2020-06-14 DIAGNOSIS — E46 Unspecified protein-calorie malnutrition: Secondary | ICD-10-CM | POA: Diagnosis not present

## 2020-06-14 DIAGNOSIS — R278 Other lack of coordination: Secondary | ICD-10-CM | POA: Diagnosis not present

## 2020-06-14 DIAGNOSIS — U071 COVID-19: Secondary | ICD-10-CM | POA: Diagnosis not present

## 2020-06-14 DIAGNOSIS — D649 Anemia, unspecified: Secondary | ICD-10-CM | POA: Diagnosis not present

## 2020-06-14 DIAGNOSIS — I10 Essential (primary) hypertension: Secondary | ICD-10-CM | POA: Diagnosis not present

## 2020-06-14 DIAGNOSIS — Z79899 Other long term (current) drug therapy: Secondary | ICD-10-CM | POA: Diagnosis not present

## 2020-06-14 DIAGNOSIS — R1310 Dysphagia, unspecified: Secondary | ICD-10-CM | POA: Diagnosis not present

## 2020-06-14 DIAGNOSIS — E785 Hyperlipidemia, unspecified: Secondary | ICD-10-CM | POA: Diagnosis not present

## 2020-06-14 DIAGNOSIS — G309 Alzheimer's disease, unspecified: Secondary | ICD-10-CM | POA: Diagnosis not present

## 2020-06-14 DIAGNOSIS — Q159 Congenital malformation of eye, unspecified: Secondary | ICD-10-CM | POA: Diagnosis not present

## 2020-06-17 DIAGNOSIS — R1310 Dysphagia, unspecified: Secondary | ICD-10-CM | POA: Diagnosis not present

## 2020-06-17 DIAGNOSIS — G309 Alzheimer's disease, unspecified: Secondary | ICD-10-CM | POA: Diagnosis not present

## 2020-06-17 DIAGNOSIS — R41841 Cognitive communication deficit: Secondary | ICD-10-CM | POA: Diagnosis not present

## 2020-06-17 DIAGNOSIS — R278 Other lack of coordination: Secondary | ICD-10-CM | POA: Diagnosis not present

## 2020-06-17 DIAGNOSIS — M1990 Unspecified osteoarthritis, unspecified site: Secondary | ICD-10-CM | POA: Diagnosis not present

## 2020-06-17 DIAGNOSIS — E46 Unspecified protein-calorie malnutrition: Secondary | ICD-10-CM | POA: Diagnosis not present

## 2020-06-18 DIAGNOSIS — R41841 Cognitive communication deficit: Secondary | ICD-10-CM | POA: Diagnosis not present

## 2020-06-18 DIAGNOSIS — E46 Unspecified protein-calorie malnutrition: Secondary | ICD-10-CM | POA: Diagnosis not present

## 2020-06-18 DIAGNOSIS — M1990 Unspecified osteoarthritis, unspecified site: Secondary | ICD-10-CM | POA: Diagnosis not present

## 2020-06-18 DIAGNOSIS — G309 Alzheimer's disease, unspecified: Secondary | ICD-10-CM | POA: Diagnosis not present

## 2020-06-18 DIAGNOSIS — R278 Other lack of coordination: Secondary | ICD-10-CM | POA: Diagnosis not present

## 2020-06-18 DIAGNOSIS — R1310 Dysphagia, unspecified: Secondary | ICD-10-CM | POA: Diagnosis not present

## 2020-06-19 DIAGNOSIS — E46 Unspecified protein-calorie malnutrition: Secondary | ICD-10-CM | POA: Diagnosis not present

## 2020-06-19 DIAGNOSIS — R1310 Dysphagia, unspecified: Secondary | ICD-10-CM | POA: Diagnosis not present

## 2020-06-19 DIAGNOSIS — R278 Other lack of coordination: Secondary | ICD-10-CM | POA: Diagnosis not present

## 2020-06-19 DIAGNOSIS — R41841 Cognitive communication deficit: Secondary | ICD-10-CM | POA: Diagnosis not present

## 2020-06-19 DIAGNOSIS — G309 Alzheimer's disease, unspecified: Secondary | ICD-10-CM | POA: Diagnosis not present

## 2020-06-19 DIAGNOSIS — M1990 Unspecified osteoarthritis, unspecified site: Secondary | ICD-10-CM | POA: Diagnosis not present

## 2020-06-20 DIAGNOSIS — E46 Unspecified protein-calorie malnutrition: Secondary | ICD-10-CM | POA: Diagnosis not present

## 2020-06-20 DIAGNOSIS — M1990 Unspecified osteoarthritis, unspecified site: Secondary | ICD-10-CM | POA: Diagnosis not present

## 2020-06-20 DIAGNOSIS — R278 Other lack of coordination: Secondary | ICD-10-CM | POA: Diagnosis not present

## 2020-06-20 DIAGNOSIS — G309 Alzheimer's disease, unspecified: Secondary | ICD-10-CM | POA: Diagnosis not present

## 2020-06-20 DIAGNOSIS — R1310 Dysphagia, unspecified: Secondary | ICD-10-CM | POA: Diagnosis not present

## 2020-06-20 DIAGNOSIS — R41841 Cognitive communication deficit: Secondary | ICD-10-CM | POA: Diagnosis not present

## 2020-06-21 DIAGNOSIS — R41841 Cognitive communication deficit: Secondary | ICD-10-CM | POA: Diagnosis not present

## 2020-06-21 DIAGNOSIS — R1310 Dysphagia, unspecified: Secondary | ICD-10-CM | POA: Diagnosis not present

## 2020-06-21 DIAGNOSIS — G309 Alzheimer's disease, unspecified: Secondary | ICD-10-CM | POA: Diagnosis not present

## 2020-06-21 DIAGNOSIS — E46 Unspecified protein-calorie malnutrition: Secondary | ICD-10-CM | POA: Diagnosis not present

## 2020-06-21 DIAGNOSIS — M1990 Unspecified osteoarthritis, unspecified site: Secondary | ICD-10-CM | POA: Diagnosis not present

## 2020-06-21 DIAGNOSIS — R278 Other lack of coordination: Secondary | ICD-10-CM | POA: Diagnosis not present

## 2020-06-24 DIAGNOSIS — G309 Alzheimer's disease, unspecified: Secondary | ICD-10-CM | POA: Diagnosis not present

## 2020-06-24 DIAGNOSIS — R41841 Cognitive communication deficit: Secondary | ICD-10-CM | POA: Diagnosis not present

## 2020-06-24 DIAGNOSIS — I10 Essential (primary) hypertension: Secondary | ICD-10-CM | POA: Diagnosis not present

## 2020-06-24 DIAGNOSIS — E46 Unspecified protein-calorie malnutrition: Secondary | ICD-10-CM | POA: Diagnosis not present

## 2020-06-24 DIAGNOSIS — R278 Other lack of coordination: Secondary | ICD-10-CM | POA: Diagnosis not present

## 2020-06-24 DIAGNOSIS — Z741 Need for assistance with personal care: Secondary | ICD-10-CM | POA: Diagnosis not present

## 2020-06-24 DIAGNOSIS — M1990 Unspecified osteoarthritis, unspecified site: Secondary | ICD-10-CM | POA: Diagnosis not present

## 2020-06-24 DIAGNOSIS — R1310 Dysphagia, unspecified: Secondary | ICD-10-CM | POA: Diagnosis not present

## 2020-06-24 DIAGNOSIS — I82409 Acute embolism and thrombosis of unspecified deep veins of unspecified lower extremity: Secondary | ICD-10-CM | POA: Diagnosis not present

## 2020-06-25 DIAGNOSIS — Z741 Need for assistance with personal care: Secondary | ICD-10-CM | POA: Diagnosis not present

## 2020-06-25 DIAGNOSIS — R1310 Dysphagia, unspecified: Secondary | ICD-10-CM | POA: Diagnosis not present

## 2020-06-25 DIAGNOSIS — R41841 Cognitive communication deficit: Secondary | ICD-10-CM | POA: Diagnosis not present

## 2020-06-25 DIAGNOSIS — M1990 Unspecified osteoarthritis, unspecified site: Secondary | ICD-10-CM | POA: Diagnosis not present

## 2020-06-25 DIAGNOSIS — E46 Unspecified protein-calorie malnutrition: Secondary | ICD-10-CM | POA: Diagnosis not present

## 2020-06-25 DIAGNOSIS — R278 Other lack of coordination: Secondary | ICD-10-CM | POA: Diagnosis not present

## 2020-06-26 DIAGNOSIS — M1990 Unspecified osteoarthritis, unspecified site: Secondary | ICD-10-CM | POA: Diagnosis not present

## 2020-06-26 DIAGNOSIS — R1310 Dysphagia, unspecified: Secondary | ICD-10-CM | POA: Diagnosis not present

## 2020-06-26 DIAGNOSIS — Z741 Need for assistance with personal care: Secondary | ICD-10-CM | POA: Diagnosis not present

## 2020-06-26 DIAGNOSIS — R278 Other lack of coordination: Secondary | ICD-10-CM | POA: Diagnosis not present

## 2020-06-26 DIAGNOSIS — R41841 Cognitive communication deficit: Secondary | ICD-10-CM | POA: Diagnosis not present

## 2020-06-26 DIAGNOSIS — E46 Unspecified protein-calorie malnutrition: Secondary | ICD-10-CM | POA: Diagnosis not present

## 2020-06-27 DIAGNOSIS — R41841 Cognitive communication deficit: Secondary | ICD-10-CM | POA: Diagnosis not present

## 2020-06-27 DIAGNOSIS — R1310 Dysphagia, unspecified: Secondary | ICD-10-CM | POA: Diagnosis not present

## 2020-06-27 DIAGNOSIS — M1990 Unspecified osteoarthritis, unspecified site: Secondary | ICD-10-CM | POA: Diagnosis not present

## 2020-06-27 DIAGNOSIS — Z741 Need for assistance with personal care: Secondary | ICD-10-CM | POA: Diagnosis not present

## 2020-06-27 DIAGNOSIS — E46 Unspecified protein-calorie malnutrition: Secondary | ICD-10-CM | POA: Diagnosis not present

## 2020-06-27 DIAGNOSIS — R278 Other lack of coordination: Secondary | ICD-10-CM | POA: Diagnosis not present

## 2020-06-28 DIAGNOSIS — E46 Unspecified protein-calorie malnutrition: Secondary | ICD-10-CM | POA: Diagnosis not present

## 2020-06-28 DIAGNOSIS — M1990 Unspecified osteoarthritis, unspecified site: Secondary | ICD-10-CM | POA: Diagnosis not present

## 2020-06-28 DIAGNOSIS — R41841 Cognitive communication deficit: Secondary | ICD-10-CM | POA: Diagnosis not present

## 2020-06-28 DIAGNOSIS — R1310 Dysphagia, unspecified: Secondary | ICD-10-CM | POA: Diagnosis not present

## 2020-06-28 DIAGNOSIS — R278 Other lack of coordination: Secondary | ICD-10-CM | POA: Diagnosis not present

## 2020-06-28 DIAGNOSIS — Z741 Need for assistance with personal care: Secondary | ICD-10-CM | POA: Diagnosis not present

## 2020-07-01 DIAGNOSIS — Z741 Need for assistance with personal care: Secondary | ICD-10-CM | POA: Diagnosis not present

## 2020-07-01 DIAGNOSIS — E46 Unspecified protein-calorie malnutrition: Secondary | ICD-10-CM | POA: Diagnosis not present

## 2020-07-01 DIAGNOSIS — R278 Other lack of coordination: Secondary | ICD-10-CM | POA: Diagnosis not present

## 2020-07-01 DIAGNOSIS — R1310 Dysphagia, unspecified: Secondary | ICD-10-CM | POA: Diagnosis not present

## 2020-07-01 DIAGNOSIS — R41841 Cognitive communication deficit: Secondary | ICD-10-CM | POA: Diagnosis not present

## 2020-07-01 DIAGNOSIS — M1990 Unspecified osteoarthritis, unspecified site: Secondary | ICD-10-CM | POA: Diagnosis not present

## 2020-07-02 DIAGNOSIS — E46 Unspecified protein-calorie malnutrition: Secondary | ICD-10-CM | POA: Diagnosis not present

## 2020-07-02 DIAGNOSIS — R41841 Cognitive communication deficit: Secondary | ICD-10-CM | POA: Diagnosis not present

## 2020-07-02 DIAGNOSIS — R278 Other lack of coordination: Secondary | ICD-10-CM | POA: Diagnosis not present

## 2020-07-02 DIAGNOSIS — Z741 Need for assistance with personal care: Secondary | ICD-10-CM | POA: Diagnosis not present

## 2020-07-02 DIAGNOSIS — M1990 Unspecified osteoarthritis, unspecified site: Secondary | ICD-10-CM | POA: Diagnosis not present

## 2020-07-02 DIAGNOSIS — R1310 Dysphagia, unspecified: Secondary | ICD-10-CM | POA: Diagnosis not present

## 2020-07-03 DIAGNOSIS — R41841 Cognitive communication deficit: Secondary | ICD-10-CM | POA: Diagnosis not present

## 2020-07-03 DIAGNOSIS — R278 Other lack of coordination: Secondary | ICD-10-CM | POA: Diagnosis not present

## 2020-07-03 DIAGNOSIS — E46 Unspecified protein-calorie malnutrition: Secondary | ICD-10-CM | POA: Diagnosis not present

## 2020-07-03 DIAGNOSIS — M1990 Unspecified osteoarthritis, unspecified site: Secondary | ICD-10-CM | POA: Diagnosis not present

## 2020-07-03 DIAGNOSIS — R1310 Dysphagia, unspecified: Secondary | ICD-10-CM | POA: Diagnosis not present

## 2020-07-03 DIAGNOSIS — Z741 Need for assistance with personal care: Secondary | ICD-10-CM | POA: Diagnosis not present

## 2020-07-04 DIAGNOSIS — Z741 Need for assistance with personal care: Secondary | ICD-10-CM | POA: Diagnosis not present

## 2020-07-04 DIAGNOSIS — R41841 Cognitive communication deficit: Secondary | ICD-10-CM | POA: Diagnosis not present

## 2020-07-04 DIAGNOSIS — E46 Unspecified protein-calorie malnutrition: Secondary | ICD-10-CM | POA: Diagnosis not present

## 2020-07-04 DIAGNOSIS — R1310 Dysphagia, unspecified: Secondary | ICD-10-CM | POA: Diagnosis not present

## 2020-07-04 DIAGNOSIS — M1990 Unspecified osteoarthritis, unspecified site: Secondary | ICD-10-CM | POA: Diagnosis not present

## 2020-07-04 DIAGNOSIS — R278 Other lack of coordination: Secondary | ICD-10-CM | POA: Diagnosis not present

## 2020-07-05 DIAGNOSIS — I82409 Acute embolism and thrombosis of unspecified deep veins of unspecified lower extremity: Secondary | ICD-10-CM | POA: Diagnosis not present

## 2020-07-05 DIAGNOSIS — Q159 Congenital malformation of eye, unspecified: Secondary | ICD-10-CM | POA: Diagnosis not present

## 2020-07-05 DIAGNOSIS — U071 COVID-19: Secondary | ICD-10-CM | POA: Diagnosis not present

## 2020-07-05 DIAGNOSIS — I10 Essential (primary) hypertension: Secondary | ICD-10-CM | POA: Diagnosis not present

## 2020-07-05 DIAGNOSIS — G309 Alzheimer's disease, unspecified: Secondary | ICD-10-CM | POA: Diagnosis not present

## 2020-07-08 DIAGNOSIS — Z741 Need for assistance with personal care: Secondary | ICD-10-CM | POA: Diagnosis not present

## 2020-07-08 DIAGNOSIS — E46 Unspecified protein-calorie malnutrition: Secondary | ICD-10-CM | POA: Diagnosis not present

## 2020-07-08 DIAGNOSIS — R1310 Dysphagia, unspecified: Secondary | ICD-10-CM | POA: Diagnosis not present

## 2020-07-08 DIAGNOSIS — M1990 Unspecified osteoarthritis, unspecified site: Secondary | ICD-10-CM | POA: Diagnosis not present

## 2020-07-08 DIAGNOSIS — R41841 Cognitive communication deficit: Secondary | ICD-10-CM | POA: Diagnosis not present

## 2020-07-08 DIAGNOSIS — R278 Other lack of coordination: Secondary | ICD-10-CM | POA: Diagnosis not present

## 2020-07-09 DIAGNOSIS — Z741 Need for assistance with personal care: Secondary | ICD-10-CM | POA: Diagnosis not present

## 2020-07-09 DIAGNOSIS — M1990 Unspecified osteoarthritis, unspecified site: Secondary | ICD-10-CM | POA: Diagnosis not present

## 2020-07-09 DIAGNOSIS — R1310 Dysphagia, unspecified: Secondary | ICD-10-CM | POA: Diagnosis not present

## 2020-07-09 DIAGNOSIS — R278 Other lack of coordination: Secondary | ICD-10-CM | POA: Diagnosis not present

## 2020-07-09 DIAGNOSIS — R41841 Cognitive communication deficit: Secondary | ICD-10-CM | POA: Diagnosis not present

## 2020-07-09 DIAGNOSIS — E46 Unspecified protein-calorie malnutrition: Secondary | ICD-10-CM | POA: Diagnosis not present

## 2020-07-10 DIAGNOSIS — Z741 Need for assistance with personal care: Secondary | ICD-10-CM | POA: Diagnosis not present

## 2020-07-10 DIAGNOSIS — R41841 Cognitive communication deficit: Secondary | ICD-10-CM | POA: Diagnosis not present

## 2020-07-10 DIAGNOSIS — R278 Other lack of coordination: Secondary | ICD-10-CM | POA: Diagnosis not present

## 2020-07-10 DIAGNOSIS — E46 Unspecified protein-calorie malnutrition: Secondary | ICD-10-CM | POA: Diagnosis not present

## 2020-07-10 DIAGNOSIS — R1310 Dysphagia, unspecified: Secondary | ICD-10-CM | POA: Diagnosis not present

## 2020-07-10 DIAGNOSIS — M1990 Unspecified osteoarthritis, unspecified site: Secondary | ICD-10-CM | POA: Diagnosis not present

## 2020-07-12 DIAGNOSIS — R1310 Dysphagia, unspecified: Secondary | ICD-10-CM | POA: Diagnosis not present

## 2020-07-12 DIAGNOSIS — E46 Unspecified protein-calorie malnutrition: Secondary | ICD-10-CM | POA: Diagnosis not present

## 2020-07-12 DIAGNOSIS — R278 Other lack of coordination: Secondary | ICD-10-CM | POA: Diagnosis not present

## 2020-07-12 DIAGNOSIS — R41841 Cognitive communication deficit: Secondary | ICD-10-CM | POA: Diagnosis not present

## 2020-07-12 DIAGNOSIS — M1990 Unspecified osteoarthritis, unspecified site: Secondary | ICD-10-CM | POA: Diagnosis not present

## 2020-07-12 DIAGNOSIS — Z741 Need for assistance with personal care: Secondary | ICD-10-CM | POA: Diagnosis not present

## 2020-07-15 DIAGNOSIS — R41841 Cognitive communication deficit: Secondary | ICD-10-CM | POA: Diagnosis not present

## 2020-07-15 DIAGNOSIS — R1310 Dysphagia, unspecified: Secondary | ICD-10-CM | POA: Diagnosis not present

## 2020-07-15 DIAGNOSIS — M1990 Unspecified osteoarthritis, unspecified site: Secondary | ICD-10-CM | POA: Diagnosis not present

## 2020-07-15 DIAGNOSIS — E46 Unspecified protein-calorie malnutrition: Secondary | ICD-10-CM | POA: Diagnosis not present

## 2020-07-15 DIAGNOSIS — Z741 Need for assistance with personal care: Secondary | ICD-10-CM | POA: Diagnosis not present

## 2020-07-15 DIAGNOSIS — R278 Other lack of coordination: Secondary | ICD-10-CM | POA: Diagnosis not present

## 2020-07-16 DIAGNOSIS — R41841 Cognitive communication deficit: Secondary | ICD-10-CM | POA: Diagnosis not present

## 2020-07-16 DIAGNOSIS — M1990 Unspecified osteoarthritis, unspecified site: Secondary | ICD-10-CM | POA: Diagnosis not present

## 2020-07-16 DIAGNOSIS — E46 Unspecified protein-calorie malnutrition: Secondary | ICD-10-CM | POA: Diagnosis not present

## 2020-07-16 DIAGNOSIS — R1310 Dysphagia, unspecified: Secondary | ICD-10-CM | POA: Diagnosis not present

## 2020-07-16 DIAGNOSIS — Z741 Need for assistance with personal care: Secondary | ICD-10-CM | POA: Diagnosis not present

## 2020-07-16 DIAGNOSIS — R278 Other lack of coordination: Secondary | ICD-10-CM | POA: Diagnosis not present

## 2020-07-17 DIAGNOSIS — Z741 Need for assistance with personal care: Secondary | ICD-10-CM | POA: Diagnosis not present

## 2020-07-17 DIAGNOSIS — M1990 Unspecified osteoarthritis, unspecified site: Secondary | ICD-10-CM | POA: Diagnosis not present

## 2020-07-17 DIAGNOSIS — E46 Unspecified protein-calorie malnutrition: Secondary | ICD-10-CM | POA: Diagnosis not present

## 2020-07-17 DIAGNOSIS — R278 Other lack of coordination: Secondary | ICD-10-CM | POA: Diagnosis not present

## 2020-07-17 DIAGNOSIS — R1310 Dysphagia, unspecified: Secondary | ICD-10-CM | POA: Diagnosis not present

## 2020-07-17 DIAGNOSIS — R41841 Cognitive communication deficit: Secondary | ICD-10-CM | POA: Diagnosis not present

## 2020-07-18 DIAGNOSIS — R278 Other lack of coordination: Secondary | ICD-10-CM | POA: Diagnosis not present

## 2020-07-18 DIAGNOSIS — E46 Unspecified protein-calorie malnutrition: Secondary | ICD-10-CM | POA: Diagnosis not present

## 2020-07-18 DIAGNOSIS — R41841 Cognitive communication deficit: Secondary | ICD-10-CM | POA: Diagnosis not present

## 2020-07-18 DIAGNOSIS — R1310 Dysphagia, unspecified: Secondary | ICD-10-CM | POA: Diagnosis not present

## 2020-07-18 DIAGNOSIS — M1990 Unspecified osteoarthritis, unspecified site: Secondary | ICD-10-CM | POA: Diagnosis not present

## 2020-07-18 DIAGNOSIS — Z741 Need for assistance with personal care: Secondary | ICD-10-CM | POA: Diagnosis not present

## 2020-07-19 DIAGNOSIS — R1310 Dysphagia, unspecified: Secondary | ICD-10-CM | POA: Diagnosis not present

## 2020-07-19 DIAGNOSIS — M1990 Unspecified osteoarthritis, unspecified site: Secondary | ICD-10-CM | POA: Diagnosis not present

## 2020-07-19 DIAGNOSIS — Z741 Need for assistance with personal care: Secondary | ICD-10-CM | POA: Diagnosis not present

## 2020-07-19 DIAGNOSIS — R41841 Cognitive communication deficit: Secondary | ICD-10-CM | POA: Diagnosis not present

## 2020-07-19 DIAGNOSIS — E46 Unspecified protein-calorie malnutrition: Secondary | ICD-10-CM | POA: Diagnosis not present

## 2020-07-19 DIAGNOSIS — R278 Other lack of coordination: Secondary | ICD-10-CM | POA: Diagnosis not present

## 2020-07-22 DIAGNOSIS — Z741 Need for assistance with personal care: Secondary | ICD-10-CM | POA: Diagnosis not present

## 2020-07-22 DIAGNOSIS — R278 Other lack of coordination: Secondary | ICD-10-CM | POA: Diagnosis not present

## 2020-07-22 DIAGNOSIS — E46 Unspecified protein-calorie malnutrition: Secondary | ICD-10-CM | POA: Diagnosis not present

## 2020-07-22 DIAGNOSIS — M1990 Unspecified osteoarthritis, unspecified site: Secondary | ICD-10-CM | POA: Diagnosis not present

## 2020-07-22 DIAGNOSIS — R41841 Cognitive communication deficit: Secondary | ICD-10-CM | POA: Diagnosis not present

## 2020-07-22 DIAGNOSIS — R1310 Dysphagia, unspecified: Secondary | ICD-10-CM | POA: Diagnosis not present

## 2020-07-23 DIAGNOSIS — Z741 Need for assistance with personal care: Secondary | ICD-10-CM | POA: Diagnosis not present

## 2020-07-23 DIAGNOSIS — R41841 Cognitive communication deficit: Secondary | ICD-10-CM | POA: Diagnosis not present

## 2020-07-23 DIAGNOSIS — M1990 Unspecified osteoarthritis, unspecified site: Secondary | ICD-10-CM | POA: Diagnosis not present

## 2020-07-23 DIAGNOSIS — R1310 Dysphagia, unspecified: Secondary | ICD-10-CM | POA: Diagnosis not present

## 2020-07-23 DIAGNOSIS — R278 Other lack of coordination: Secondary | ICD-10-CM | POA: Diagnosis not present

## 2020-07-23 DIAGNOSIS — E46 Unspecified protein-calorie malnutrition: Secondary | ICD-10-CM | POA: Diagnosis not present

## 2020-07-26 DIAGNOSIS — U071 COVID-19: Secondary | ICD-10-CM | POA: Diagnosis not present

## 2020-07-26 DIAGNOSIS — I10 Essential (primary) hypertension: Secondary | ICD-10-CM | POA: Diagnosis not present

## 2020-07-26 DIAGNOSIS — G309 Alzheimer's disease, unspecified: Secondary | ICD-10-CM | POA: Diagnosis not present

## 2020-07-26 DIAGNOSIS — Q159 Congenital malformation of eye, unspecified: Secondary | ICD-10-CM | POA: Diagnosis not present

## 2020-07-26 DIAGNOSIS — I82409 Acute embolism and thrombosis of unspecified deep veins of unspecified lower extremity: Secondary | ICD-10-CM | POA: Diagnosis not present

## 2020-07-27 IMAGING — DX PORTABLE CHEST - 1 VIEW
1 series · 1 of 1 positions shown · non-contrast
Comparison: 07/02/2018

CLINICAL DATA: Fever

EXAM:
PORTABLE CHEST 1 VIEW

[chest ap]
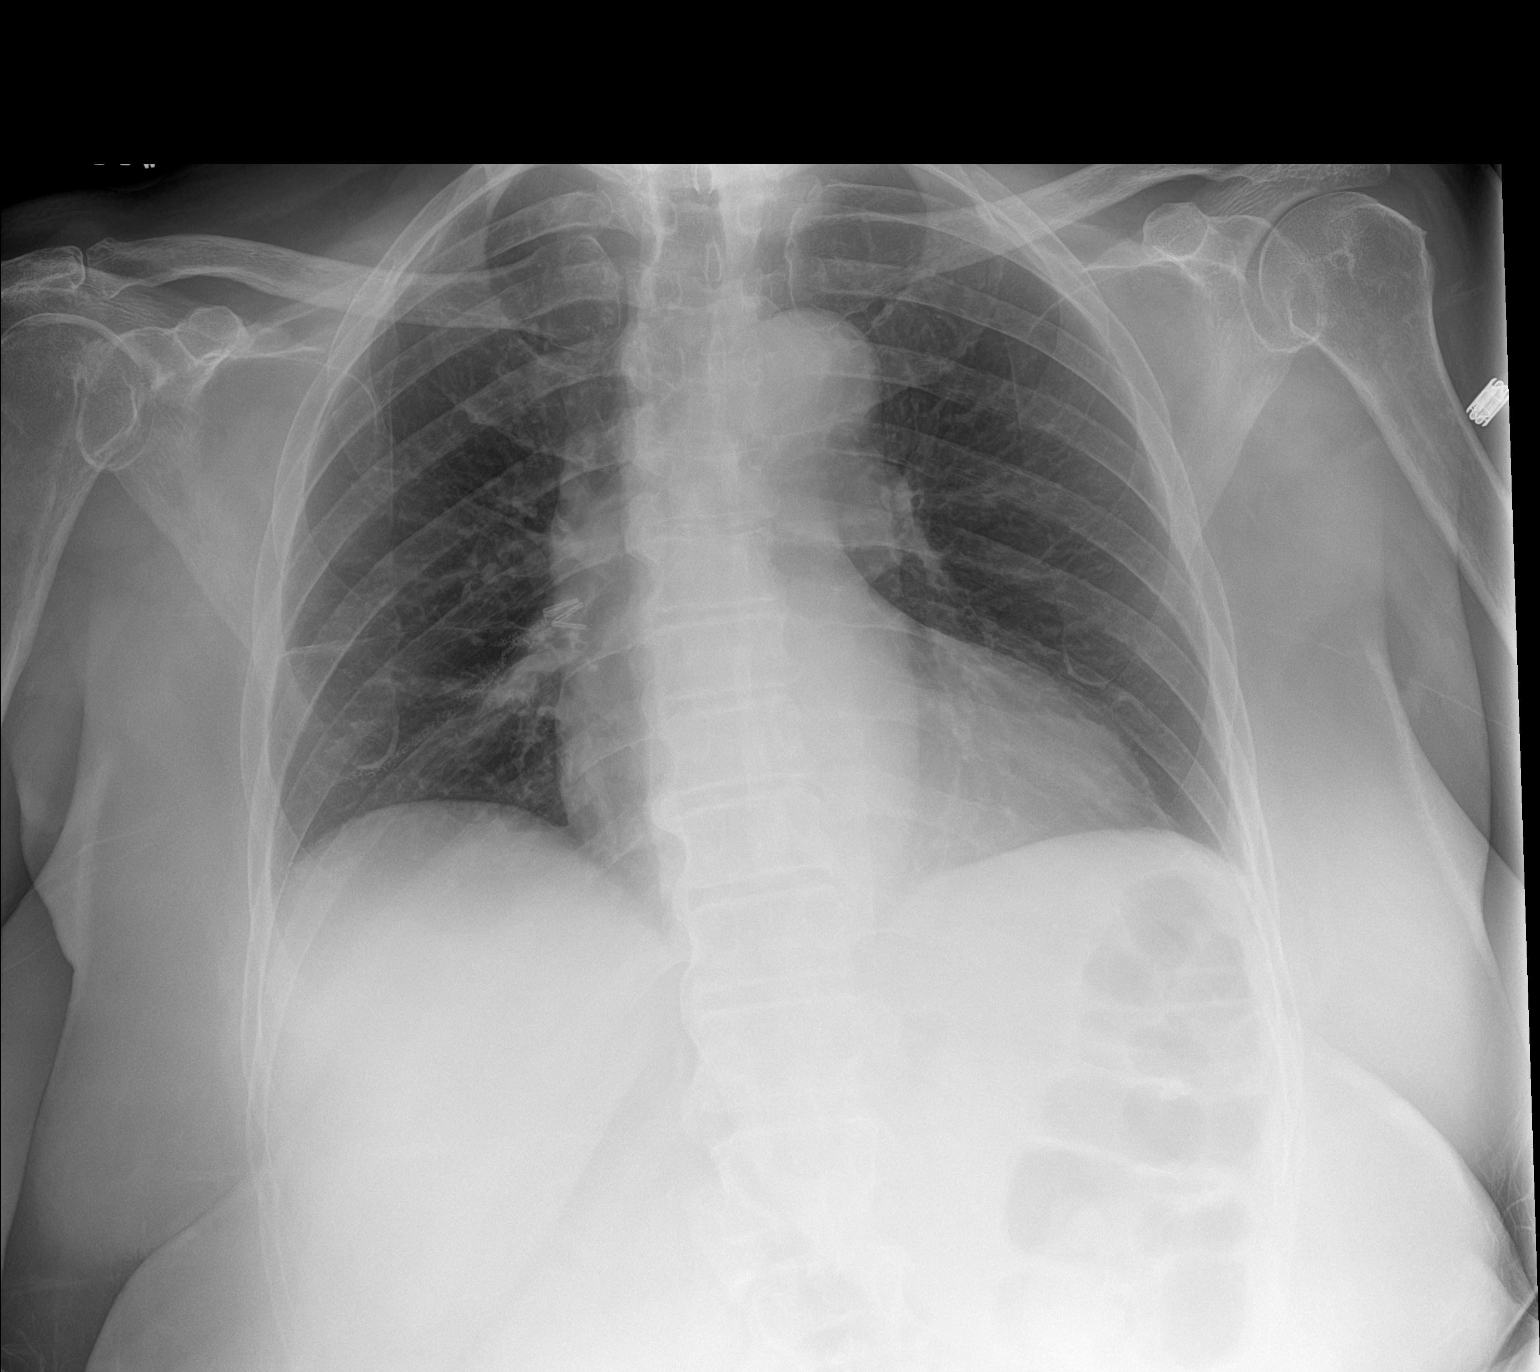

[1 of 1 positions shown; findings below may reference images not displayed]

FINDINGS: Heart is borderline in size. Lungs clear. No effusions or acute bony
abnormality.
IMPRESSION: No active disease.

## 2021-01-19 ENCOUNTER — Emergency Department (HOSPITAL_COMMUNITY): Payer: Medicare Other

## 2021-01-19 ENCOUNTER — Emergency Department (HOSPITAL_COMMUNITY)
Admission: EM | Admit: 2021-01-19 | Discharge: 2021-01-19 | Disposition: A | Discharge status: Home / Self Care | Payer: Medicare Other | Attending: Emergency Medicine | Admitting: Emergency Medicine

## 2021-01-19 ENCOUNTER — Encounter (HOSPITAL_COMMUNITY): Payer: Self-pay | Admitting: Emergency Medicine

## 2021-01-19 ENCOUNTER — Other Ambulatory Visit: Payer: Self-pay

## 2021-01-19 DIAGNOSIS — Z79899 Other long term (current) drug therapy: Secondary | ICD-10-CM | POA: Diagnosis not present

## 2021-01-19 DIAGNOSIS — Z7901 Long term (current) use of anticoagulants: Secondary | ICD-10-CM | POA: Insufficient documentation

## 2021-01-19 DIAGNOSIS — R4182 Altered mental status, unspecified: Secondary | ICD-10-CM | POA: Diagnosis present

## 2021-01-19 DIAGNOSIS — Z20822 Contact with and (suspected) exposure to covid-19: Secondary | ICD-10-CM | POA: Insufficient documentation

## 2021-01-19 DIAGNOSIS — Z8616 Personal history of COVID-19: Secondary | ICD-10-CM | POA: Diagnosis not present

## 2021-01-19 DIAGNOSIS — F02818 Dementia in other diseases classified elsewhere, unspecified severity, with other behavioral disturbance: Secondary | ICD-10-CM | POA: Insufficient documentation

## 2021-01-19 DIAGNOSIS — G309 Alzheimer's disease, unspecified: Secondary | ICD-10-CM | POA: Insufficient documentation

## 2021-01-19 DIAGNOSIS — J45909 Unspecified asthma, uncomplicated: Secondary | ICD-10-CM | POA: Insufficient documentation

## 2021-01-19 DIAGNOSIS — I10 Essential (primary) hypertension: Secondary | ICD-10-CM | POA: Insufficient documentation

## 2021-01-19 LAB — COMPREHENSIVE METABOLIC PANEL
ALT: 12 U/L (ref 0–44)
AST: 50 U/L — ABNORMAL HIGH (ref 15–41)
Albumin: 3.4 g/dL — ABNORMAL LOW (ref 3.5–5.0)
Alkaline Phosphatase: 90 U/L (ref 38–126)
Anion gap: 6 (ref 5–15)
BUN: 35 mg/dL — ABNORMAL HIGH (ref 8–23)
CO2: 27 mmol/L (ref 22–32)
Calcium: 9 mg/dL (ref 8.9–10.3)
Chloride: 109 mmol/L (ref 98–111)
Creatinine, Ser: 0.86 mg/dL (ref 0.44–1.00)
GFR, Estimated: >60 mL/min (ref >60)
Glucose, Bld: 99 mg/dL (ref 70–99)
Potassium: 5.8 mmol/L — ABNORMAL HIGH (ref 3.5–5.1)
Sodium: 142 mmol/L (ref 135–145)
Total Bilirubin: 1.4 mg/dL — ABNORMAL HIGH (ref 0.3–1.2)
Total Protein: 6.9 g/dL (ref 6.5–8.1)

## 2021-01-19 LAB — URINALYSIS, ROUTINE W REFLEX MICROSCOPIC
Bilirubin Urine: NEGATIVE
Glucose, UA: NEGATIVE mg/dL
Ketones, ur: NEGATIVE mg/dL
Leukocytes,Ua: NEGATIVE
Nitrite: NEGATIVE
Protein, ur: NEGATIVE mg/dL
Specific Gravity, Urine: 1.023 (ref 1.005–1.030)
pH: 5 (ref 5.0–8.0)

## 2021-01-19 LAB — CBC WITH DIFFERENTIAL/PLATELET
Abs Immature Granulocytes: 0.01 10*3/uL (ref 0.00–0.07)
Basophils Absolute: 0 10*3/uL (ref 0.0–0.1)
Basophils Relative: 1 %
Eosinophils Absolute: 0.2 10*3/uL (ref 0.0–0.5)
Eosinophils Relative: 3 %
HCT: 40.1 % (ref 36.0–46.0)
Hemoglobin: 12.5 g/dL (ref 12.0–15.0)
Immature Granulocytes: 0 %
Lymphocytes Relative: 25 %
Lymphs Abs: 1.7 10*3/uL (ref 0.7–4.0)
MCH: 26.3 pg (ref 26.0–34.0)
MCHC: 31.2 g/dL (ref 30.0–36.0)
MCV: 84.4 fL (ref 80.0–100.0)
Monocytes Absolute: 0.5 10*3/uL (ref 0.1–1.0)
Monocytes Relative: 7 %
Neutro Abs: 4.5 10*3/uL (ref 1.7–7.7)
Neutrophils Relative %: 64 %
Platelets: 204 10*3/uL (ref 150–400)
RBC: 4.75 MIL/uL (ref 3.87–5.11)
RDW: 16.6 % — ABNORMAL HIGH (ref 11.5–15.5)
WBC: 7 10*3/uL (ref 4.0–10.5)
nRBC: 0 % (ref 0.0–0.2)

## 2021-01-19 LAB — RESP PANEL BY RT-PCR (FLU A&B, COVID) ARPGX2
Influenza A by PCR: NEGATIVE
Influenza B by PCR: NEGATIVE
SARS Coronavirus 2 by RT PCR: NEGATIVE

## 2021-01-19 LAB — TROPONIN I (HIGH SENSITIVITY)
Troponin I (High Sensitivity): 8 ng/L (ref <18)
Troponin I (High Sensitivity): 8 ng/L (ref <18)

## 2021-01-19 LAB — POTASSIUM: Potassium: 3.9 mmol/L (ref 3.5–5.1)

## 2021-01-19 MED ORDER — SODIUM CHLORIDE 0.9 % IV BOLUS
500.0000 mL | Freq: Once | INTRAVENOUS | Status: AC
  Administered 2021-01-19: 21:00:00 500 mL via INTRAVENOUS

## 2021-01-19 NOTE — ED Triage Notes (Signed)
Patient presents to the ED by EMS with c/o AMS noticed by staff. Pt ate breakfast this morning per usual. Lunch time came staff noticed pt eyes were closed. Staff used sternal rub several times until pt opened eyes responding to pain. Now back at baseline, she has alzheimer's does not ambulate and only uses yes or no when speaking.  Staff also noted that her pulse rate is normally WDL but today is 40's.

## 2021-01-19 NOTE — ED Notes (Signed)
Spoke with facility for report and also for diet. Pt eats pureed foods and drinks thin liquids when tolerable.

## 2021-01-19 NOTE — ED Notes (Signed)
[facility] CALLED  °

## 2021-01-19 NOTE — ED Provider Notes (Signed)
[facility] EMERGENCY DEPARTMENT Provider Note   CSN: [id number] Arrival date & time: 01/19/21  1357     History Chief Complaint  Patient presents with   Altered Mental Status    Courtney Day is a 86 y.o. female with a history of advanced dementia presenting from [facility] nursing home (dementia ward) with concern for possible altered mental status.  Per EMS report from the staff, the patient had breakfast as per normal today.  Around lunchtime they found the patient lying at rest with her eyes closed, would not wake up as per normal.  She only open her eyes to sternal rub.  Now she is back to her baseline state, which is a nonverbal state, bedbound, but does open her eyes and look about.  There is some concern that the patient was more bradycardic than normal.  The staff reported her heart rate typically in the 70s or 80s on weekly check, but today were in the high 40s.  There is no reported fevers, hypoxia, or visible seizure  The patient arrives with a DNR form at bedside  HPI     Past Medical History:  Diagnosis Date   Allergy    Asthma    > probably all pseudoasthma, -D/C ACE March 30,2010 > pseudowheeze resolved,cough resolved SPN RLL   Dementia (HCC) 2010   Dr. Terrace Arabia   Depression    Hypertension    Knee pain    Bilateral   Low back pain    Osteoarthritis    Vitiligo 2010    Patient Active Problem List   Diagnosis Date Noted   Acute metabolic encephalopathy 03/16/2019   Severe dehydration 03/15/2019   Hypotension 03/15/2019   Hypernatremia 03/15/2019   AKI (acute kidney injury) (HCC)    Sepsis secondary to UTI (HCC) 07/23/2018   Rash and nonspecific skin eruption 07/06/2018   DVT (deep venous thrombosis) (HCC) 07/05/2018   Well adult exam 05/18/2013   Right-sided chest wall pain 09/01/2010   BREAST PAIN 03/18/2010   Alzheimer's disease (HCC) 11/26/2009   Vitiligo 11/26/2009   PULMONARY NODULE 05/22/2008   HYPOKALEMIA 05/14/2008    VERTIGO 05/14/2008   ALLERGIC RHINITIS 04/27/2008   Asthma 04/27/2008   UPPER RESPIRATORY INFECTION (URI) 03/26/2008   Nonspecific (abnormal) findings on radiological and other examination of body structure 03/26/2008   CHEST XRAY, ABNORMAL 03/26/2008   SINUSITIS, ACUTE 02/28/2008   Osteoarthritis 07/06/2007   SHOULDER PAIN 07/06/2007   LEG PAIN 05/03/2007   Situational depression 12/24/2006   Essential hypertension 12/24/2006   LOW BACK PAIN 12/24/2006    Past Surgical History:  Procedure Laterality Date   ABDOMINAL HYSTERECTOMY     hystoplasmosis lung biopsy- 2010 Dr. Wenda Overland       OB History   No obstetric history on file.     Family History  Problem Relation Age of Onset   Cancer Mother    Cancer Sister        smoker   Multiple sclerosis Son    Hypertension Other     Social History   Tobacco Use   Smoking status: Never   Smokeless tobacco: Never  Substance Use Topics   Alcohol use: No   Drug use: No    Home Medications Prior to Admission medications   Medication Sig Start Date End Date Taking? Authorizing Provider  acetaminophen (TYLENOL) 325 MG tablet Take 2 tablets (650 mg total) by mouth every 6 (six) hours as needed for mild pain (or Fever >/= 101).  07/26/18  Yes Vassie Loll, MD  Cholecalciferol (VITAMIN D3) 250 MCG (10000 UT) TABS Take 1 tablet by mouth daily.   Yes [provider]  donepezil (ARICEPT) 23 MG TABS tablet Take 1 tablet (23 mg total) by mouth at bedtime. 06/14/17  Yes Micki Riley, MD  hydrALAZINE (APRESOLINE) 10 MG tablet Take 1 tablet (10 mg total) by mouth 2 (two) times a day. Patient taking differently: Take 10 mg by mouth 3 (three) times daily. 07/26/18 01/19/21 Yes Vassie Loll, MD  Infant Care Products [facility]) OINT Apply 1 application topically 3 (three) times daily. Apply to sacrum every shift   Yes [provider]  rivaroxaban (XARELTO) 20 MG TABS tablet Take 1 tablet (20 mg total) by mouth daily with  supper. 07/26/18  Yes Vassie Loll, MD  Elastic Bandages & Supports (KNEE COMPRESSION SLEEVE/S/M) MISC 1 each by Does not apply route daily. Put on left leg daily and take off at night Patient not taking: Reported on 03/15/2019 07/06/18   Pincus Sanes, MD  triamcinolone cream (KENALOG) 0.5 % Apply 1 application topically 3 (three) times daily. Patient not taking: Reported on 03/15/2019 03/07/18   Plotnikov, Georgina Quint, MD  Vitamin D, Ergocalciferol, (DRISDOL) 1.25 MG (50000 UT) CAPS capsule Take 1 capsule (50,000 Units total) by mouth every 30 (thirty) days. Patient not taking: Reported on 03/15/2019 08/23/18   Vassie Loll, MD    Allergies    Ace inhibitors, Amlodipine, Amlodipine besylate-valsartan, Namenda [memantine hcl], and Valsartan  Review of Systems   Review of Systems  Unable to perform ROS: Dementia (level 5 caveat)   Physical Exam Updated Vital Signs BP (!) 111/93   Pulse 83   Temp 98.1 F (36.7 C) (Axillary)   Resp 15   Ht 5' (1.524 m)   Wt 52.6 kg   SpO2 100%   BMI 22.65 kg/m   Physical Exam Constitutional:      General: She is not in acute distress. HENT:     Head: Normocephalic and atraumatic.  Eyes:     Conjunctiva/sclera: Conjunctivae normal.     Pupils: Pupils are equal, round, and reactive to light.  Cardiovascular:     Rate and Rhythm: Normal rate and regular rhythm.     Pulses: Normal pulses.  Pulmonary:     Effort: Pulmonary effort is normal.  Abdominal:     General: There is no distension.     Tenderness: There is no abdominal tenderness.  Skin:    General: Skin is warm and dry.  Neurological:     General: No focal deficit present.     Mental Status: She is alert.     Comments: Eyes open, patient nonverbal, not following commands, appears to be baseline level    ED Results / Procedures / Treatments   Labs (all labs ordered are listed, but only abnormal results are displayed) Labs Reviewed  COMPREHENSIVE METABOLIC PANEL - Abnormal; Notable  for the following components:      Result Value   Potassium 5.8 (*)    BUN 35 (*)    Albumin 3.4 (*)    AST 50 (*)    Total Bilirubin 1.4 (*)    All other components within normal limits  CBC WITH DIFFERENTIAL/PLATELET - Abnormal; Notable for the following components:   RDW 16.6 (*)    All other components within normal limits  URINALYSIS, ROUTINE W REFLEX MICROSCOPIC - Abnormal; Notable for the following components:   APPearance HAZY (*)    Hgb  urine dipstick SMALL (*)    Bacteria, UA RARE (*)    All other components within normal limits  RESP PANEL BY RT-PCR (FLU A&B, COVID) ARPGX2  POTASSIUM  TROPONIN I (HIGH SENSITIVITY)  TROPONIN I (HIGH SENSITIVITY)    EKG EKG Interpretation  Date/Time:  Sunday January 19 2021 14:06:31 EST Ventricular Rate:  45 PR Interval:  210 QRS Duration: 144 QT Interval:  539 QTC Calculation: 467 R Axis:   34 Text Interpretation: Sinus bradycardia Right bundle branch block Confirmed by Alvester Chou [phone]) on 01/19/2021 8:04:40 PM  Radiology CT HEAD WO CONTRAST ( )  Result Date: 01/19/2021 CLINICAL DATA:  Altered mental status EXAM: CT HEAD WITHOUT CONTRAST TECHNIQUE: Contiguous axial images were obtained from the base of the skull through the vertex without intravenous contrast. COMPARISON:  CT head 03/15/2019 FINDINGS: Study is somewhat limited due to motion. Brain: No acute intracranial hemorrhage, mass effect, or herniation. No extra-axial fluid collections. No evidence of acute territorial infarct. No hydrocephalus. Cortical volume loss. Patchy hypodensities in the periventricular and subcortical white matter, likely secondary to chronic microvascular ischemic changes. Vascular: No hyperdense vessel or unexpected calcification. Skull: No skull fracture.  Hyperostosis frontalis interna. Sinuses/Orbits: No acute finding. Other: None. IMPRESSION: Chronic changes with no acute intracranial process identified. Electronically Signed   By: Jannifer Hick M.D.   On: 01/19/2021 15:10    Procedures Procedures   Medications Ordered in ED Medications  sodium chloride 0.9 % bolus 500 mL (0 mLs Intravenous Stopped 01/19/21 2135)    ED Course  I have reviewed the triage vital signs and the nursing notes.  Pertinent labs & imaging results that were available during my care of the patient were reviewed by me and considered in my medical decision making (see chart for details).  Patient is here with episode of transient awareness for possible altered mental status, now resolved.  She appears to be back to her baseline mental status per her report.  She does not appear to be in any distress.  Her vitals are within normal limits, no fever or SIRS criteria upon arrival to suggest sepsis.  We will check her basic electrolyte levels, sodium level, potassium level, check for AKI, check for anemia, check for possible UTI.  CT head also ordered.  COVID and flu also ordered.  Otherwise she is stable  I personally reviewed and interpreted her EKG, imaging, and blood tests noted below.  Clinical Course as of 01/19/21 2148  Wynelle Link Jan 19, 2021  2054 No evidence of infection, anemia, significant dehydration in the patient's blood work.  Her vital signs have been stable throughout her stay.  Her heart rate has been in a sinus rhythm in the high 40s, very unlikely to be causing issues with confusion or weakness.  Her EKG is not consistent with high-grade heart block.  I doubt this is symptomatic bradycardia, but would advise that she follow-up with a cardiologist for this issue.  We are awaiting a repeat potassium level, anticipate discharge back to her facility following this. [MT]    Clinical Course User Index [MT] Terald Sleeper, MD   Repeat K normal - suspect initial was hemolyzed. HR now back to 80's and normal  Final Clinical Impression(s) / ED Diagnoses Final diagnoses:  Altered mental status, unspecified altered mental status type    Rx /  DC Orders ED Discharge Orders     None        Terald Sleeper, MD 01/19/21 2148

## 2021-01-19 NOTE — ED Notes (Signed)
ED Provider at bedside. 

## 2021-01-19 NOTE — Discharge Instructions (Addendum)
Discharge Summary  For facility nursing staff or doctor:  Courtney Day's medical workup in the ER today included blood test, EKG, and and radiology imaging.  The CT scan of her brain did not show any sign of a stroke.  Her x-ray did not show pneumonia in the lungs.  Her urine sample did not show sign of infection.  We do not see evidence of anemia or dehydration on her blood work.  Her COVID and flu test were negative.  Her troponin tests were normal.  Her EKG and heart rhythms did show that her heart rate was slightly slower than normal, with a heart rate around 50 bpm.  This should not be causing lethargy or weakness, but if this is new, she should follow up with a cardiologist for this.  I did not see signs of heart block on her ECG.  If she does not already have a cardiologist, you can call to make an appointment at the [facility] clinic at the number above if this bradycardia persists.  Based on this workup, she was felt to be reasonably safe for discharge.

## 2021-03-19 IMAGING — CT CT HEAD W/O CM
3 series · 16 of 47 positions shown, 19 images · non-contrast
Comparison: Head CT dated 07/02/2018.

CLINICAL DATA: 84-year-old female with altered mental status.

EXAM:
CT HEAD WITHOUT CONTRAST
TECHNIQUE: Contiguous axial images were obtained from the base of the skull
through the vertex without intravenous contrast.

[Series 3: head 5.0 h30s · axial · 0.45mm/px · z∈[-116,+34]mm · 10 of 36 slices shown, 13 images]
[im 3/36  brain]
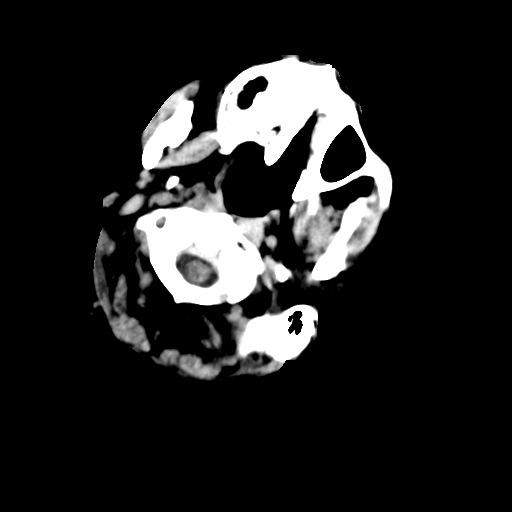
[im 3/36  bone]
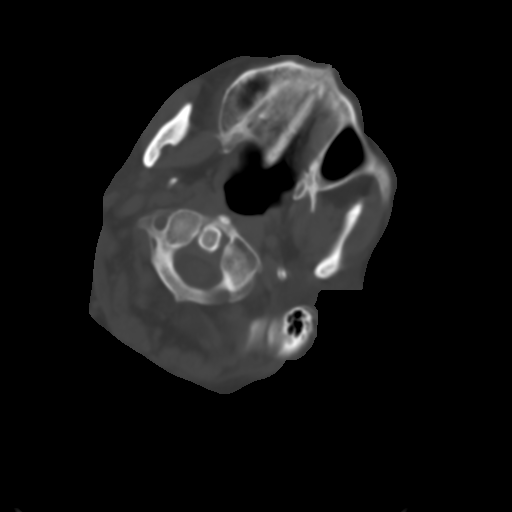
[im 7/36  brain]
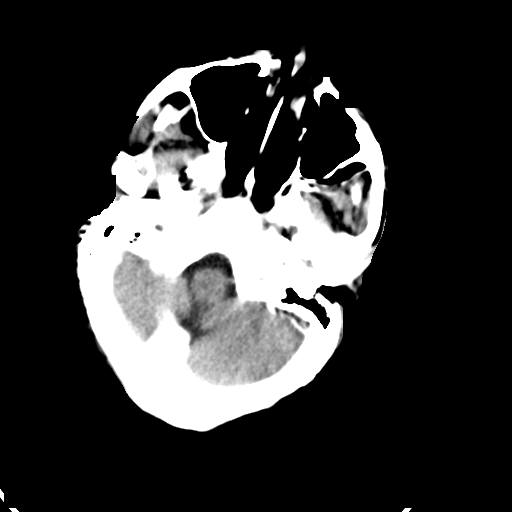
[im 10/36  brain]
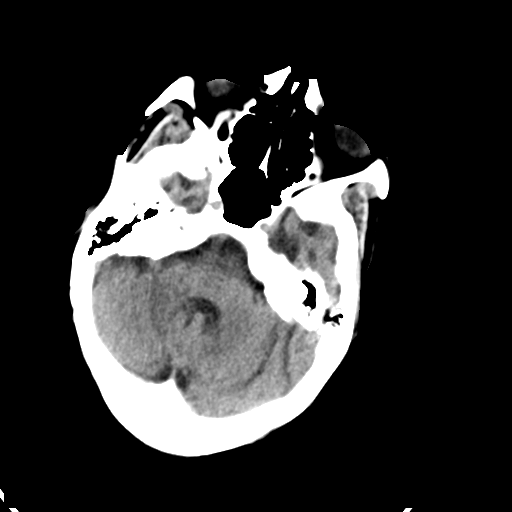
[im 13/36  brain]
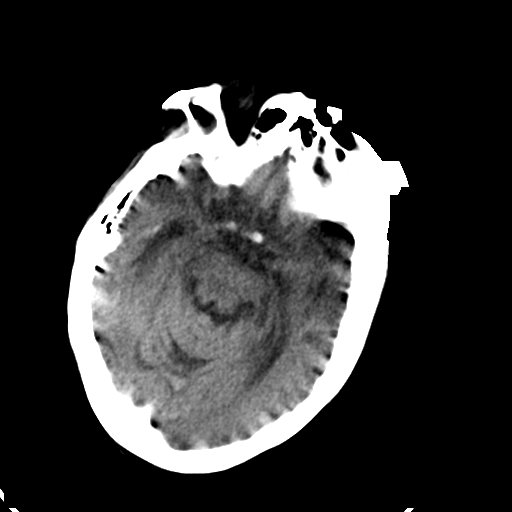
[im 16/36  brain]
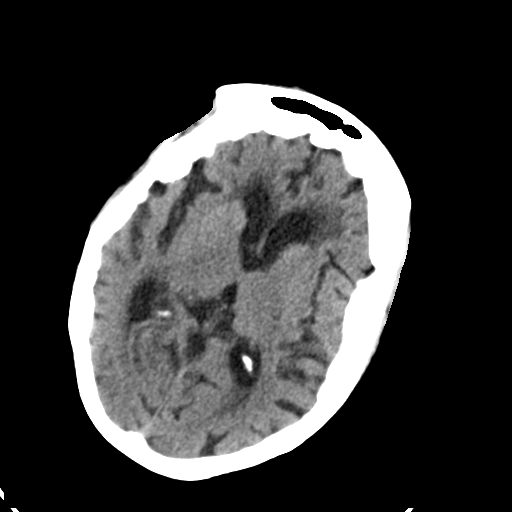
[im 16/36  bone]
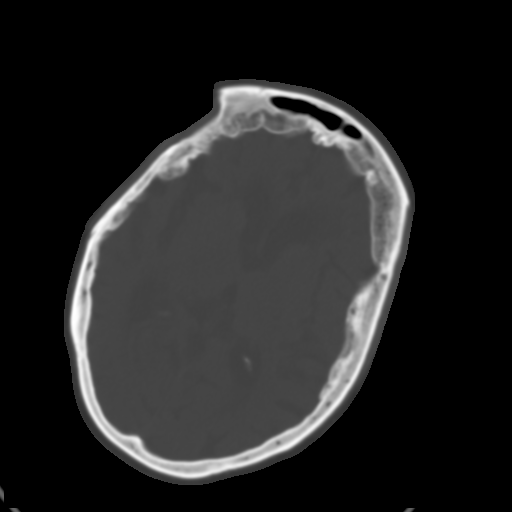
[im 20/36  brain]
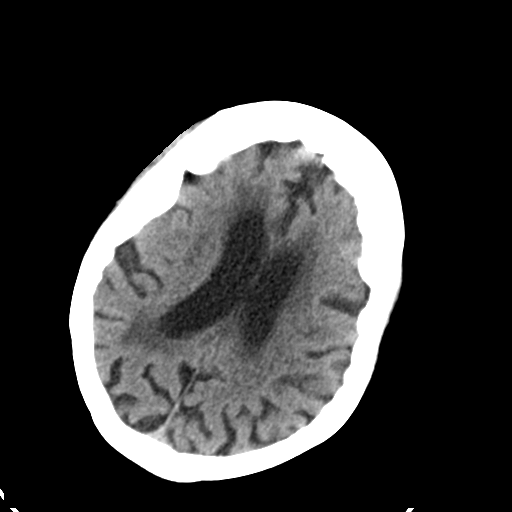
[im 23/36  brain]
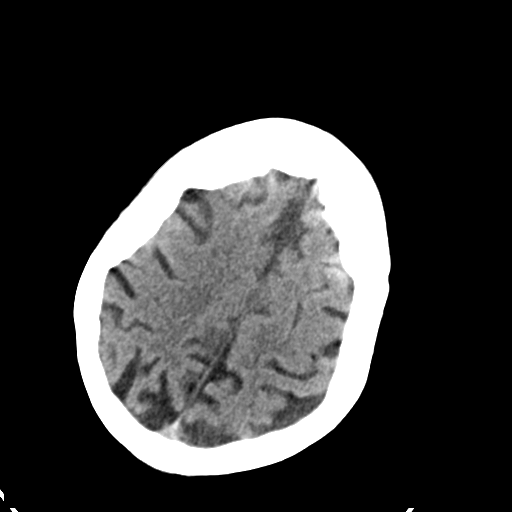
[im 27/36  brain]
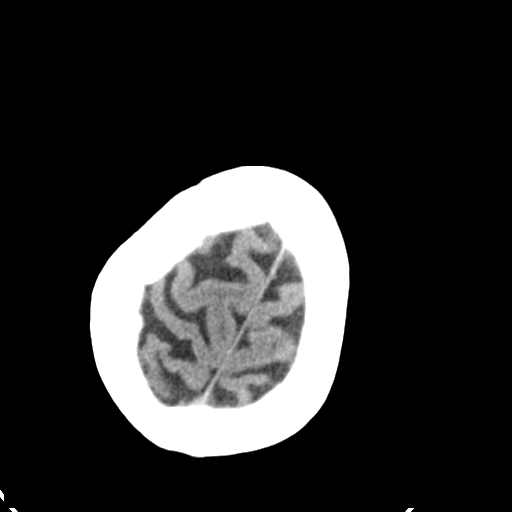
[im 29/36  brain]
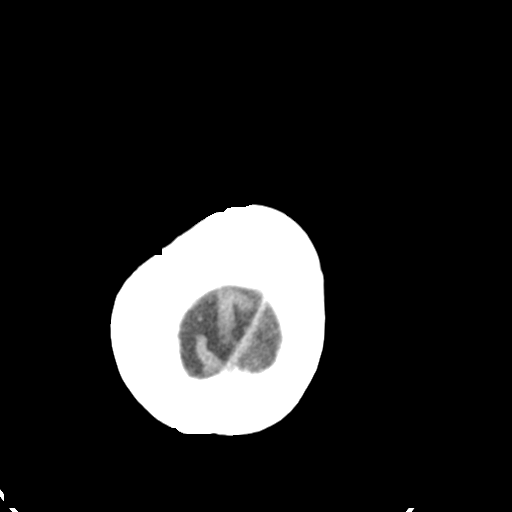
[im 29/36  bone]
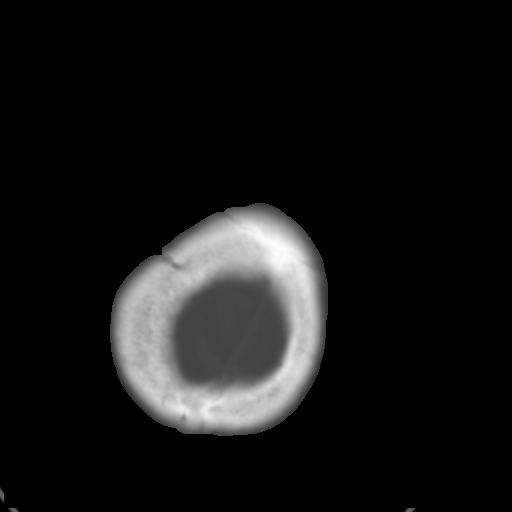
[im 33/36  brain]
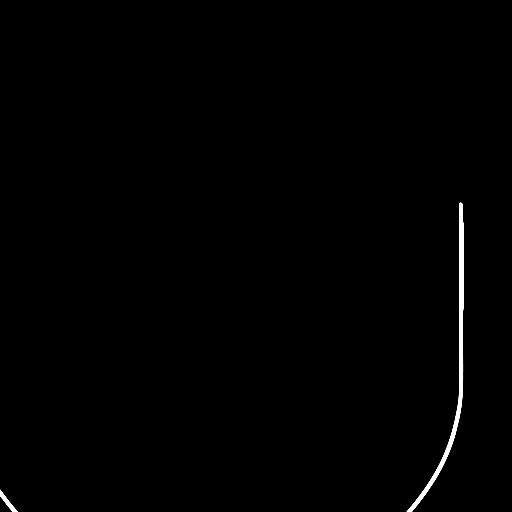

[Series 5: head 3.0 mpr cor · coronal · 0.36mm/px · 3 of 67 slices shown]
[im 23/67  brain]
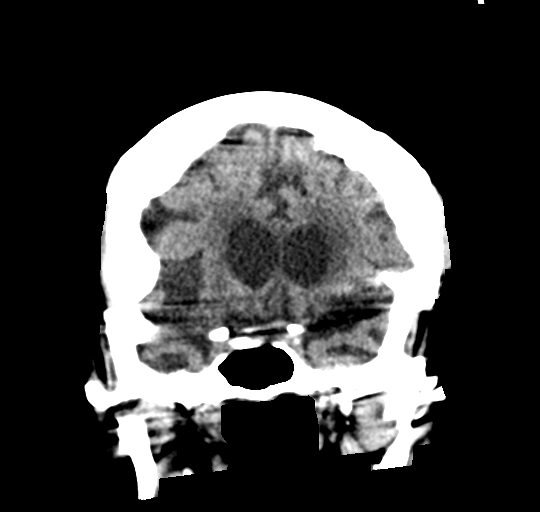
[im 30/67  brain]
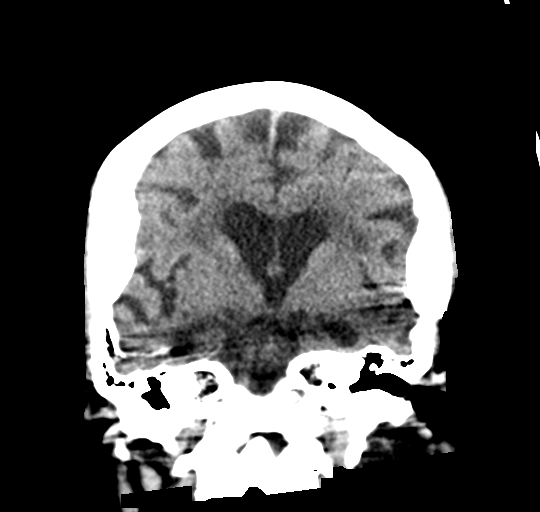
[im 37/67  brain]
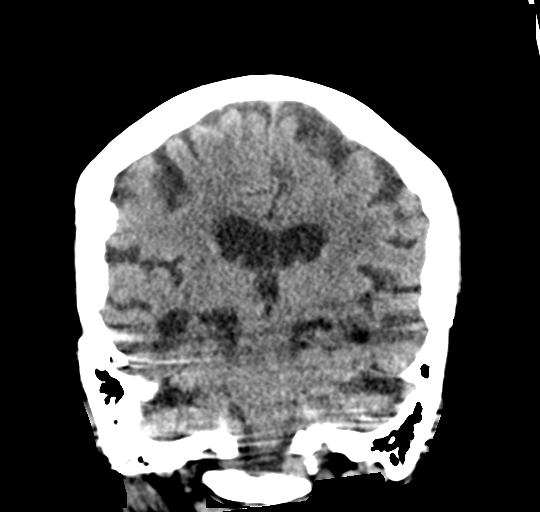

[Series 6: head 3.0 mpr sag · sagittal · 0.34mm/px · 3 of 53 slices shown]
[im 22/53  brain]
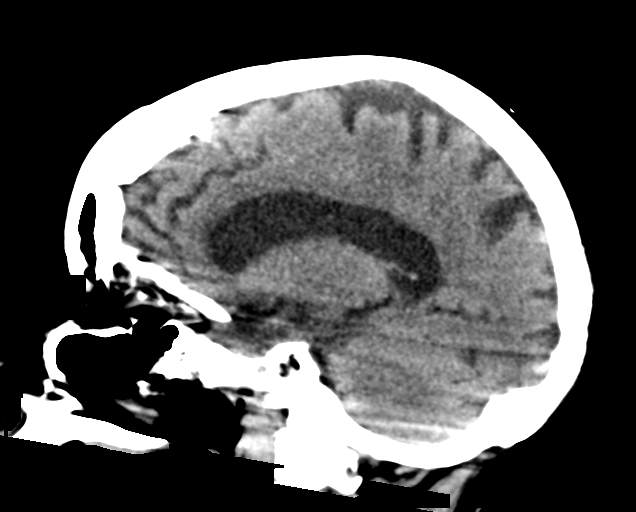
[im 27/53  brain]
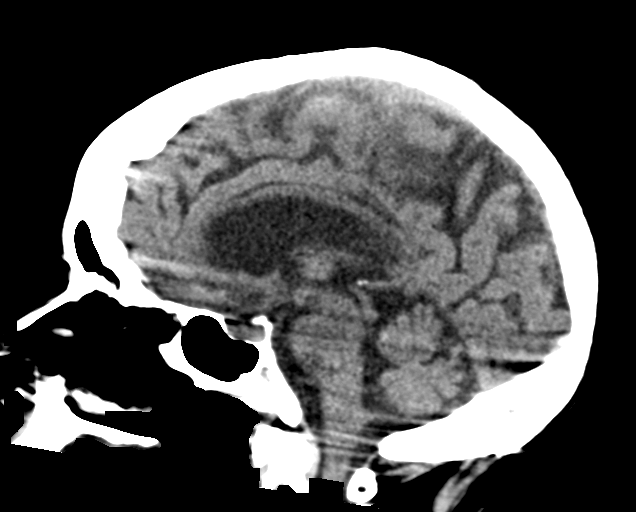
[im 32/53  brain]
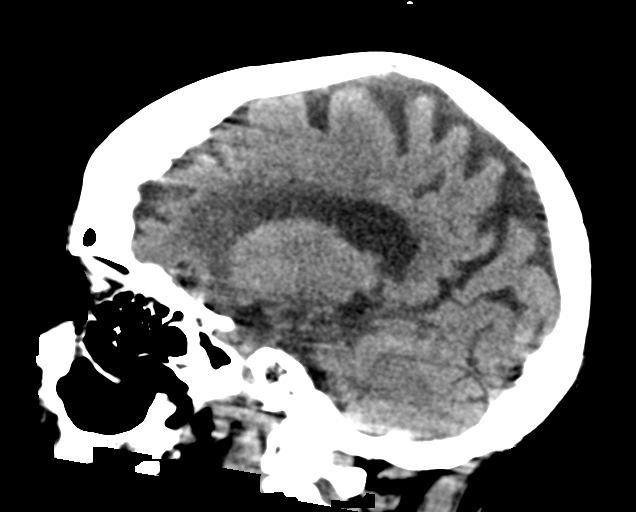

[16 of 47 positions shown; findings below may reference images not displayed]

FINDINGS: Evaluation of this exam is limited due to motion artifact.

Brain: There is moderate age-related atrophy and chronic
microvascular ischemic changes. There is no acute intracranial
hemorrhage. No mass effect or midline shift. No extra-axial fluid
collection.

Vascular: No hyperdense vessel or unexpected calcification.

Skull: Normal. Negative for fracture or focal lesion.

Sinuses/Orbits: No acute finding.

Other: None
IMPRESSION: 1. No acute intracranial hemorrhage.
2. Age-related atrophy and chronic microvascular ischemic changes.

## 2021-06-10 ENCOUNTER — Emergency Department (HOSPITAL_COMMUNITY)
Admission: EM | Admit: 2021-06-10 | Discharge: 2021-06-11 | Disposition: A | Discharge status: Home / Self Care | Payer: Medicare Other | Attending: Emergency Medicine | Admitting: Emergency Medicine

## 2021-06-10 ENCOUNTER — Emergency Department (HOSPITAL_COMMUNITY): Payer: Medicare Other

## 2021-06-10 DIAGNOSIS — J45909 Unspecified asthma, uncomplicated: Secondary | ICD-10-CM | POA: Diagnosis not present

## 2021-06-10 DIAGNOSIS — Z79899 Other long term (current) drug therapy: Secondary | ICD-10-CM | POA: Diagnosis not present

## 2021-06-10 DIAGNOSIS — Z7901 Long term (current) use of anticoagulants: Secondary | ICD-10-CM | POA: Insufficient documentation

## 2021-06-10 DIAGNOSIS — I10 Essential (primary) hypertension: Secondary | ICD-10-CM | POA: Diagnosis not present

## 2021-06-10 DIAGNOSIS — R55 Syncope and collapse: Secondary | ICD-10-CM | POA: Diagnosis not present

## 2021-06-10 DIAGNOSIS — F0390 Unspecified dementia without behavioral disturbance: Secondary | ICD-10-CM | POA: Diagnosis not present

## 2021-06-10 DIAGNOSIS — R001 Bradycardia, unspecified: Secondary | ICD-10-CM | POA: Diagnosis present

## 2021-06-10 LAB — CBC WITH DIFFERENTIAL/PLATELET
Abs Immature Granulocytes: 0.02 10*3/uL (ref 0.00–0.07)
Basophils Absolute: 0 10*3/uL (ref 0.0–0.1)
Basophils Relative: 0 %
Eosinophils Absolute: 0.2 10*3/uL (ref 0.0–0.5)
Eosinophils Relative: 3 %
HCT: 34.5 % — ABNORMAL LOW (ref 36.0–46.0)
Hemoglobin: 11.2 g/dL — ABNORMAL LOW (ref 12.0–15.0)
Immature Granulocytes: 0 %
Lymphocytes Relative: 31 %
Lymphs Abs: 2.3 10*3/uL (ref 0.7–4.0)
MCH: 27.1 pg (ref 26.0–34.0)
MCHC: 32.5 g/dL (ref 30.0–36.0)
MCV: 83.5 fL (ref 80.0–100.0)
Monocytes Absolute: 0.6 10*3/uL (ref 0.1–1.0)
Monocytes Relative: 8 %
Neutro Abs: 4.2 10*3/uL (ref 1.7–7.7)
Neutrophils Relative %: 58 %
Platelets: 186 10*3/uL (ref 150–400)
RBC: 4.13 MIL/uL (ref 3.87–5.11)
RDW: 15.8 % — ABNORMAL HIGH (ref 11.5–15.5)
WBC: 7.3 10*3/uL (ref 4.0–10.5)
nRBC: 0 % (ref 0.0–0.2)

## 2021-06-10 LAB — BASIC METABOLIC PANEL
Anion gap: 5 (ref 5–15)
BUN: 27 mg/dL — ABNORMAL HIGH (ref 8–23)
CO2: 25 mmol/L (ref 22–32)
Calcium: 9.1 mg/dL (ref 8.9–10.3)
Chloride: 111 mmol/L (ref 98–111)
Creatinine, Ser: 0.82 mg/dL (ref 0.44–1.00)
GFR, Estimated: >60 mL/min (ref >60)
Glucose, Bld: 96 mg/dL (ref 70–99)
Potassium: 4.1 mmol/L (ref 3.5–5.1)
Sodium: 141 mmol/L (ref 135–145)

## 2021-06-10 LAB — CBG MONITORING, ED: Glucose-Capillary: 83 mg/dL (ref 70–99)

## 2021-06-10 LAB — TROPONIN I (HIGH SENSITIVITY)
Troponin I (High Sensitivity): 11 ng/L (ref <18)
Troponin I (High Sensitivity): 11 ng/L (ref <18)

## 2021-06-10 LAB — D-DIMER, QUANTITATIVE: D-Dimer, Quant: <0.27 ug/mL-FEU (ref 0.00–0.50)

## 2021-06-10 NOTE — ED Provider Notes (Signed)
?[facility] EMERGENCY DEPARTMENT ?Provider Note ? ? ?CSN: [id number] ?Arrival date & time: 06/10/21  1825 ? ?  ? ?History ? ?Chief Complaint  ?Patient presents with  ? Bradycardia  ? ? ?Courtney Day is a 87 y.o. female. ? ?HPI ? ? 87 year old female with medical history significant for depression, HTN, asthma, dementia who presents from the [facility] nursing home after an episode of unresponsiveness.  She was reportedly found unresponsive by EMS, unresponsive to sternal rub.  She was found to be hypertensive, bradycardic to the 30s and 40s and had shallow irregular breath.  She was administered a nonrebreather and 1 g of Narcan and the patient subsequently became more responsive.  She is not on opiates at her facility.  Her baseline mental status is be able to say a few words but otherwise she has fairly severe dementia.  The patient's husband is bedside and provides additional history and confirms she is DNR. ? ?Home Medications ?Prior to Admission medications   ?Medication Sig Start Date End Date Taking? Authorizing Provider  ?acetaminophen (TYLENOL) 325 MG tablet Take 2 tablets (650 mg total) by mouth every 6 (six) hours as needed for mild pain (or Fever >/= 101). 07/26/18  Yes Vassie Loll, MD  ?Amino Acids-Protein Hydrolys (FEEDING SUPPLEMENT, PRO-STAT SUGAR FREE 64,) LIQD Take 30 mLs by mouth daily. 05/18/21 07/01/21 Yes [provider]  ?Cholecalciferol (VITAMIN D3) 25 MCG (1000 UT) CAPS Take 1,000 Units by mouth daily.   Yes [provider]  ?donepezil (ARICEPT) 23 MG TABS tablet Take 1 tablet (23 mg total) by mouth at bedtime. 06/14/17  Yes Micki Riley, MD  ?hydrALAZINE (APRESOLINE) 10 MG tablet Take 1 tablet (10 mg total) by mouth 2 (two) times a day. ?Patient taking differently: Take 20 mg by mouth 3 (three) times daily. 07/26/18 06/10/21 Yes Vassie Loll, MD  ?Infant Care Products [facility]) OINT Apply 1 application. topically See admin instructions. Apply ointment to  sacrum every shift/three times a day and after every incontinent episode   Yes [provider]  ?rivaroxaban (XARELTO) 20 MG TABS tablet Take 1 tablet (20 mg total) by mouth daily with supper. ?Patient taking differently: Take 20 mg by mouth at bedtime. 07/26/18  Yes Vassie Loll, MD  ?Elastic Bandages & Supports (KNEE COMPRESSION SLEEVE/S/M) MISC 1 each by Does not apply route daily. Put on left leg daily and take off at night 07/06/18   Pincus Sanes, MD  ?triamcinolone cream (KENALOG) 0.5 % Apply 1 application topically 3 (three) times daily. ?Patient not taking: Reported on 06/10/2021 03/07/18   Plotnikov, Georgina Quint, MD  ?Vitamin D, Ergocalciferol, (DRISDOL) 1.25 MG (50000 UT) CAPS capsule Take 1 capsule (50,000 Units total) by mouth every 30 (thirty) days. ?Patient not taking: Reported on 06/10/2021 08/23/18   Vassie Loll, MD  ?   ? ?Allergies    ?Ace inhibitors, Amlodipine, Amlodipine besylate-valsartan, Namenda [memantine hcl], and Valsartan   ? ?Review of Systems   ?Review of Systems  ?Unable to perform ROS: Dementia  ? ?Physical Exam ?Updated Vital Signs ?BP (!) 149/99 (BP Location: Left Arm)   Pulse 77   Temp 97.6 ?F (36.4 ?C) (Oral)   Resp 18   Ht 5\' 2"  (1.575 m)   Wt 52 kg   SpO2 100%   BMI 20.97 kg/m?  ?Physical Exam ?Vitals and nursing note reviewed.  ?Constitutional:   ?   General: She is not in acute distress. ?HENT:  ?  Head: Normocephalic and atraumatic.  ?Eyes:  ?   Conjunctiva/sclera: Conjunctivae normal.  ?   Pupils: Pupils are equal, round, and reactive to light.  ?Cardiovascular:  ?   Rate and Rhythm: Normal rate and regular rhythm.  ?Pulmonary:  ?   Effort: Pulmonary effort is normal. No respiratory distress.  ?Abdominal:  ?   General: There is no distension.  ?   Tenderness: There is no guarding.  ?Musculoskeletal:     ?   General: No deformity or signs of injury.  ?   Cervical back: Neck supple.  ?Skin: ?   Findings: No lesion or rash.  ?Neurological:  ?   General: No focal  deficit present.  ?   Mental Status: She is alert. Mental status is at baseline.  ?   Comments: No cranial nerve deficit. At baseline for strength and sensation per her husband  ? ? ?ED Results / Procedures / Treatments   ?Labs ?(all labs ordered are listed, but only abnormal results are displayed) ?Labs Reviewed  ?CBC WITH DIFFERENTIAL/PLATELET - Abnormal; Notable for the following components:  ?    Result Value  ? Hemoglobin 11.2 (*)   ? HCT 34.5 (*)   ? RDW 15.8 (*)   ? All other components within normal limits  ?BASIC METABOLIC PANEL - Abnormal; Notable for the following components:  ? BUN 27 (*)   ? All other components within normal limits  ?D-DIMER, QUANTITATIVE  ?CBG MONITORING, ED  ?TROPONIN I (HIGH SENSITIVITY)  ?TROPONIN I (HIGH SENSITIVITY)  ? ? ?EKG ?EKG Interpretation ? ?Date/Time:  Tuesday June 10 2021 18:45:11 EDT ?Ventricular Rate:  44 ?PR Interval:  228 ?QRS Duration: 143 ?QT Interval:  543 ?QTC Calculation: 465 ?R Axis:   15 ?Text Interpretation: Sinus bradycardia Borderline prolonged PR interval Right bundle branch block Confirmed by Ernie Avena ([id number]) on 06/10/2021 8:06:09 PM ? ?Radiology ?CT HEAD WO CONTRAST ( ) ? ?Result Date: 06/10/2021 ?CLINICAL DATA:  Unresponsive, hypertensive, bradycardia, dimension EXAM: CT HEAD WITHOUT CONTRAST TECHNIQUE: Contiguous axial images were obtained from the base of the skull through the vertex without intravenous contrast. RADIATION DOSE REDUCTION: This exam was performed according to the departmental dose-optimization program which includes automated exposure control, adjustment of the mA and/or kV according to patient size and/or use of iterative reconstruction technique. COMPARISON:  01/19/2021 FINDINGS: Brain: No acute infarct or hemorrhage. Chronic small vessel ischemic changes throughout the periventricular white matter. Lateral ventricles and remaining midline structures are unremarkable. No acute extra-axial fluid collections. No mass effect.  Vascular: No hyperdense vessel or unexpected calcification. Skull: Normal. Negative for fracture or focal lesion. Sinuses/Orbits: No acute finding. Other: None. IMPRESSION: 1. Stable head CT, no acute intracranial process. Electronically Signed   By: Sharlet Salina M.D.   On: 06/10/2021 22:12  ? ?DG Chest Portable 1 View ? ?Result Date: 06/10/2021 ?CLINICAL DATA:  Syncope EXAM: PORTABLE CHEST 1 VIEW COMPARISON:  Chest x-ray dated January 19, 2021 FINDINGS: Cardiac and mediastinal contours are within normal limits for AP technique. Postsurgical changes of the left lung. Lungs are clear. No large pleural effusion or pneumothorax. IMPRESSION: No active disease. Electronically Signed   By: Allegra Lai M.D.   On: 06/10/2021 19:43   ? ?Procedures ?Procedures  ? ? ?Medications Ordered in ED ?Medications - No data to display ? ?ED Course/ Medical Decision Making/ A&P ?Clinical Course as of 06/12/21 1426  ?Tue Jun 10, 2021  ?2231 Pulse Rate(!): 49 [JL]  ?  ?Clinical Course User Index ?[  JL] Ernie AvenaLawsing, Calleigh Lafontant, MD  ? ?                        ?Medical Decision Making ?Amount and/or Complexity of Data Reviewed ?Labs: ordered. ?Radiology: ordered. ? ? ? 87 year old female with medical history significant for depression, HTN, asthma, dementia who presents from the [facility] after an episode of unresponsiveness.  She was reportedly found unresponsive by EMS, unresponsive to sternal rub.  She was found to be hypertensive, bradycardic to the 30s and 40s and had shallow irregular breath.  She was administered a nonrebreather and 1 g of Narcan and the patient subsequently became more responsive.  She is not on opiates at her facility.  Her baseline mental status is be able to say a few words but otherwise she has fairly severe dementia.  The patient's husband is bedside and provides additional history and confirms she is DNR. ? ? ?Currently, Courtney Day is awake, alert, and hemodynamically stable.  I am most concerned  for symptomatic bradycardia. I do not think that this is due to ACS, PE, dysrhythmia, electrolyte abnormality, heart valve abnormality such as aortic stenosis, HOCM, pneumothorax, aortic dissection, ruptured AAA, a

## 2021-06-10 NOTE — ED Triage Notes (Signed)
Pt arrive via [facility] from [facility]. Courtney Day reports finding pt unresponsive in bed during their rounds. Per EMS pt unresponsive  to sternal rub. Pt found to be hypertensive, brady 30-40s and shallow irregular breaths. EMS applied nonrebreather and gave 1 of narcan and pt immediately started responding. Pts basellne says few words and recognizes few people due to her severe dementia. EMS reports no prescribed opioids. ?

## 2021-06-10 NOTE — Discharge Instructions (Addendum)
You were evaluated in the Emergency Department and after careful evaluation, we did not find any emergent condition requiring admission or further testing in the hospital.  Your exam/testing today was overall reassuring.  Please return to the Emergency Department if you experience any worsening of your condition.  Thank you for allowing us to be a part of your care.  

## 2021-06-10 NOTE — ED Notes (Signed)
Received patient in no acute distress breathing spontaneously to room air. Patient is alert but not oriented to person , place or time . Patient  has h/x of dementia . Blood work complete. Chest x-ray complete. Pending head CT . Continuing monitoring.  ?

## 2022-05-24 ENCOUNTER — Encounter (HOSPITAL_COMMUNITY): Payer: Self-pay | Admitting: Emergency Medicine

## 2022-05-24 ENCOUNTER — Emergency Department (HOSPITAL_COMMUNITY)
Admission: EM | Admit: 2022-05-24 | Discharge: 2022-05-24 | Disposition: A | Discharge status: Skilled Nursing Facility | Payer: Medicare Other | Attending: Emergency Medicine | Admitting: Emergency Medicine

## 2022-05-24 ENCOUNTER — Emergency Department (HOSPITAL_COMMUNITY): Payer: Medicare Other

## 2022-05-24 ENCOUNTER — Other Ambulatory Visit: Payer: Self-pay

## 2022-05-24 DIAGNOSIS — Z7901 Long term (current) use of anticoagulants: Secondary | ICD-10-CM | POA: Diagnosis not present

## 2022-05-24 DIAGNOSIS — G309 Alzheimer's disease, unspecified: Secondary | ICD-10-CM | POA: Insufficient documentation

## 2022-05-24 DIAGNOSIS — I10 Essential (primary) hypertension: Secondary | ICD-10-CM | POA: Diagnosis not present

## 2022-05-24 DIAGNOSIS — R350 Frequency of micturition: Secondary | ICD-10-CM | POA: Diagnosis present

## 2022-05-24 DIAGNOSIS — Z1152 Encounter for screening for COVID-19: Secondary | ICD-10-CM | POA: Insufficient documentation

## 2022-05-24 DIAGNOSIS — J45909 Unspecified asthma, uncomplicated: Secondary | ICD-10-CM | POA: Insufficient documentation

## 2022-05-24 DIAGNOSIS — F0280 Dementia in other diseases classified elsewhere without behavioral disturbance: Secondary | ICD-10-CM | POA: Diagnosis not present

## 2022-05-24 DIAGNOSIS — N3001 Acute cystitis with hematuria: Secondary | ICD-10-CM | POA: Diagnosis not present

## 2022-05-24 LAB — CBC WITH DIFFERENTIAL/PLATELET
Abs Immature Granulocytes: 0.06 10*3/uL (ref 0.00–0.07)
Basophils Absolute: 0 10*3/uL (ref 0.0–0.1)
Basophils Relative: 0 %
Eosinophils Absolute: 0.1 10*3/uL (ref 0.0–0.5)
Eosinophils Relative: 0 %
HCT: 36 % (ref 36.0–46.0)
Hemoglobin: 11.2 g/dL — ABNORMAL LOW (ref 12.0–15.0)
Immature Granulocytes: 0 %
Lymphocytes Relative: 13 %
Lymphs Abs: 1.8 10*3/uL (ref 0.7–4.0)
MCH: 26.3 pg (ref 26.0–34.0)
MCHC: 31.1 g/dL (ref 30.0–36.0)
MCV: 84.5 fL (ref 80.0–100.0)
Monocytes Absolute: 1.2 10*3/uL — ABNORMAL HIGH (ref 0.1–1.0)
Monocytes Relative: 9 %
Neutro Abs: 10.7 10*3/uL — ABNORMAL HIGH (ref 1.7–7.7)
Neutrophils Relative %: 78 %
Platelets: 250 10*3/uL (ref 150–400)
RBC: 4.26 MIL/uL (ref 3.87–5.11)
RDW: 17 % — ABNORMAL HIGH (ref 11.5–15.5)
WBC: 13.9 10*3/uL — ABNORMAL HIGH (ref 4.0–10.5)
nRBC: 0 % (ref 0.0–0.2)

## 2022-05-24 LAB — COMPREHENSIVE METABOLIC PANEL
ALT: 131 U/L — ABNORMAL HIGH (ref 0–44)
AST: 40 U/L (ref 15–41)
Albumin: 2.8 g/dL — ABNORMAL LOW (ref 3.5–5.0)
Alkaline Phosphatase: 160 U/L — ABNORMAL HIGH (ref 38–126)
Anion gap: 8 (ref 5–15)
BUN: 33 mg/dL — ABNORMAL HIGH (ref 8–23)
CO2: 26 mmol/L (ref 22–32)
Calcium: 8.8 mg/dL — ABNORMAL LOW (ref 8.9–10.3)
Chloride: 111 mmol/L (ref 98–111)
Creatinine, Ser: 0.72 mg/dL (ref 0.44–1.00)
GFR, Estimated: >60 mL/min (ref >60)
Glucose, Bld: 134 mg/dL — ABNORMAL HIGH (ref 70–99)
Potassium: 4.1 mmol/L (ref 3.5–5.1)
Sodium: 145 mmol/L (ref 135–145)
Total Bilirubin: 1.2 mg/dL (ref 0.3–1.2)
Total Protein: 7.2 g/dL (ref 6.5–8.1)

## 2022-05-24 LAB — URINALYSIS, W/ REFLEX TO CULTURE (INFECTION SUSPECTED)
Bilirubin Urine: NEGATIVE
Glucose, UA: NEGATIVE mg/dL
Ketones, ur: NEGATIVE mg/dL
Nitrite: POSITIVE — AB
Protein, ur: 30 mg/dL — AB
Specific Gravity, Urine: 1.025 (ref 1.005–1.030)
pH: 5 (ref 5.0–8.0)

## 2022-05-24 LAB — TROPONIN I (HIGH SENSITIVITY)
Troponin I (High Sensitivity): 38 ng/L — ABNORMAL HIGH (ref <18)
Troponin I (High Sensitivity): 33 ng/L — ABNORMAL HIGH (ref <18)

## 2022-05-24 LAB — RESP PANEL BY RT-PCR (RSV, FLU A&B, COVID)  RVPGX2
Influenza A by PCR: NEGATIVE
Influenza B by PCR: NEGATIVE
Resp Syncytial Virus by PCR: NEGATIVE
SARS Coronavirus 2 by RT PCR: NEGATIVE

## 2022-05-24 LAB — MAGNESIUM: Magnesium: 2.3 mg/dL (ref 1.7–2.4)

## 2022-05-24 LAB — CBG MONITORING, ED: Glucose-Capillary: 132 mg/dL — ABNORMAL HIGH (ref 70–99)

## 2022-05-24 MED ORDER — CEFPODOXIME PROXETIL 200 MG PO TABS: 200.0000 mg | ORAL_TABLET | Freq: Two times a day (BID) | ORAL | 0 refills | Status: AC

## 2022-05-24 MED ORDER — SODIUM CHLORIDE 0.9 % IV SOLN
1.0000 g | Freq: Once | INTRAVENOUS | Status: AC
  Administered 2022-05-24: 1 g via INTRAVENOUS
  Filled 2022-05-24: qty 10

## 2022-05-24 NOTE — ED Triage Notes (Signed)
Pt BIB [facility] from [facility] for weakness, a&o 0-1 at baseline, GCS at baseline 12-13, per staff to EMS staff pt neurologically at baseline but appears slower and weaker with movements, urinary frequency with foul odor and dark in color, v/s 170/100, HR 70, RR 20, 97% O2, CBG 145

## 2022-05-24 NOTE — ED Notes (Signed)
[facility] at bedside for transport back to facility.  

## 2022-05-24 NOTE — ED Notes (Signed)
[facility] called for transport at this time 

## 2022-05-24 NOTE — ED Provider Notes (Signed)
[facility] Provider Note   CSN: [id number] Arrival date & time: 05/24/22  1545     History  Chief Complaint  Patient presents with   Weakness    Courtney Day is a 88 y.o. female with HTN, Afib h/o DVT, vertigo, asthma, alzheimer's, h/o pulmonary histoplasmosis,  presents with generalized weakness.   Pt BIB [facility] from [facility] for weakness, a&o 0-1 at baseline, GCS at baseline 12-13, per staff to EMS staff pt neurologically at baseline but appears slower and weaker with movements, urinary frequency with foul odor and dark in color, v/s 170/100, HR 70, RR 20, 97% O2, CBG 145.  Patient nonverbal with me, no history provided by patient.     Weakness      Home Medications Prior to Admission medications   Medication Sig Start Date End Date Taking? Authorizing Provider  acetaminophen (TYLENOL) 325 MG tablet Take 2 tablets (650 mg total) by mouth every 6 (six) hours as needed for mild pain (or Fever >/= 101). 07/26/18  Yes Barton Dubois, MD  cefpodoxime (VANTIN) 200 MG tablet Take 1 tablet (200 mg total) by mouth 2 (two) times daily for 7 days. 05/24/22 05/31/22 Yes Audley Hose, MD  Cholecalciferol (VITAMIN D3) 25 MCG (1000 UT) CAPS Take 1,000 Units by mouth daily.   Yes [provider]  hydrALAZINE (APRESOLINE) 10 MG tablet Take 1 tablet (10 mg total) by mouth 2 (two) times a day. Patient taking differently: Take 20 mg by mouth 3 (three) times daily. 07/26/18 05/24/22 Yes Barton Dubois, MD  Infant Care Products [facility]) OINT Apply 1 application. topically See admin instructions. Apply ointment to sacrum every shift/three times a day and after every incontinent episode   Yes [provider]  rivaroxaban (XARELTO) 20 MG TABS tablet Take 1 tablet (20 mg total) by mouth daily with supper. Patient taking differently: Take 20 mg by mouth at bedtime. 07/26/18  Yes Barton Dubois, MD  donepezil (ARICEPT) 23 MG TABS tablet Take  1 tablet (23 mg total) by mouth at bedtime. Patient not taking: Reported on 05/24/2022 06/14/17   Garvin Fila, MD  Elastic Bandages & Supports (KNEE COMPRESSION SLEEVE/S/M) MISC 1 each by Does not apply route daily. Put on left leg daily and take off at night Patient not taking: Reported on 05/24/2022 07/06/18   Binnie Rail, MD  triamcinolone cream (KENALOG) 0.5 % Apply 1 application topically 3 (three) times daily. Patient not taking: Reported on 06/10/2021 03/07/18   Plotnikov, Evie Lacks, MD  Vitamin D, Ergocalciferol, (DRISDOL) 1.25 MG (50000 UT) CAPS capsule Take 1 capsule (50,000 Units total) by mouth every 30 (thirty) days. Patient not taking: Reported on 06/10/2021 08/23/18   Barton Dubois, MD      Allergies    Ace inhibitors, Amlodipine, Amlodipine besylate-valsartan, Namenda [memantine hcl], and Valsartan    Review of Systems   Review of Systems  Unable to perform ROS: Dementia  Neurological:  Positive for weakness.   Physical Exam Updated Vital Signs BP 135/87 (BP Location: Left Arm)   Pulse 83   Temp 98.9 F (37.2 C) (Axillary)   Resp 19   Ht 5\' 2"  (1.575 m)   Wt 51.7 kg   SpO2 98%   BMI 20.85 kg/m  Physical Exam General: Elderly chronically ill-appearing female, lying in bed.  HEENT: PERRLA, Sclera anicteric, MMM, trachea midline.  Cardiology: RRR, no murmurs/rubs/gallops. BL radial and DP pulses equal bilaterally.  Resp: Normal respiratory rate and  effort. CTAB, no wheezes, rhonchi, crackles.  Abd: Soft, non-tender, non-distended. No rebound tenderness or guarding.  GU: Deferred. MSK: No peripheral edema or signs of trauma. Extremities without deformity or TTP. No cyanosis or clubbing. Skin: warm, dry.  Back: No CVA tenderness Neuro: Alert, nonverbal, CNs II-XII grossly intact. MAEs. Sensation grossly intact.   ED Results / Procedures / Treatments   Labs (all labs ordered are listed, but only abnormal results are displayed) Labs Reviewed  CBC WITH  DIFFERENTIAL/PLATELET - Abnormal; Notable for the following components:      Result Value   WBC 13.9 (*)    Hemoglobin 11.2 (*)    RDW 17.0 (*)    Neutro Abs 10.7 (*)    Monocytes Absolute 1.2 (*)    All other components within normal limits  COMPREHENSIVE METABOLIC PANEL - Abnormal; Notable for the following components:   Glucose, Bld 134 (*)    BUN 33 (*)    Calcium 8.8 (*)    Albumin 2.8 (*)    ALT 131 (*)    Alkaline Phosphatase 160 (*)    All other components within normal limits  URINALYSIS, W/ REFLEX TO CULTURE (INFECTION SUSPECTED) - Abnormal; Notable for the following components:   Color, Urine AMBER (*)    APPearance CLOUDY (*)    Hgb urine dipstick MODERATE (*)    Protein, ur 30 (*)    Nitrite POSITIVE (*)    Leukocytes,Ua TRACE (*)    Bacteria, UA MANY (*)    All other components within normal limits  CBG MONITORING, ED - Abnormal; Notable for the following components:   Glucose-Capillary 132 (*)    All other components within normal limits  TROPONIN I (HIGH SENSITIVITY) - Abnormal; Notable for the following components:   Troponin I (High Sensitivity) 38 (*)    All other components within normal limits  TROPONIN I (HIGH SENSITIVITY) - Abnormal; Notable for the following components:   Troponin I (High Sensitivity) 33 (*)    All other components within normal limits  RESP PANEL BY RT-PCR (RSV, FLU A&B, COVID)  [id number]  URINE CULTURE  MAGNESIUM    EKG EKG Interpretation  Date/Time:  Sunday May 24 2022 16:04:44 EDT Ventricular Rate:  95 PR Interval:  129 QRS Duration: 74 QT Interval:  415 QTC Calculation: 522 R Axis:   -3 Text Interpretation: Normal sinus rhythm Prolonged QT interval Confirmed by Cindee Lame [phone]) on 05/24/2022 4:14:16 PM  Radiology DG Chest 2 View  Result Date: 05/24/2022 CLINICAL DATA:  Generalized weakness EXAM: CHEST - 2 VIEW COMPARISON:  06/10/2021 FINDINGS: Heart size is normal. No pleural fluid or airspace disease. No signs of  interstitial edema. The visualized osseous structures are unremarkable. IMPRESSION: No active cardiopulmonary abnormalities. Electronically Signed   By: Kerby Moors M.D.   On: 05/24/2022 17:11    Procedures Procedures    Medications Ordered in ED Medications  cefTRIAXone (ROCEPHIN) 1 g in sodium chloride 0.9 % 100 mL IVPB (0 g Intravenous Stopped 05/24/22 1917)    ED Course/ Medical Decision Making/ A&P                          Medical Decision Making Amount and/or Complexity of Data Reviewed Labs: ordered. Decision-making details documented in ED Course. Radiology: ordered. Decision-making details documented in ED Course.  Risk Decision regarding hospitalization.    This patient presents to the ED for concern of generalized weakness, this involves an extensive number of  treatment options, and is a complaint that carries with it a high risk of complications and morbidity.  I considered the following differential and admission for this acute, potentially life threatening condition.   MDM:    DDX for generalized weakness includes but is not limited to:  Infectious processes, severe metabolic derangements or electrolyte abnormalities, ischemia/ACS, heart failure, anemia, and intracranial/central processes but think these are unlikely given the history and physical exam.   Clinical Course as of 05/24/22 2149  Sun May 24, 2022  1612 Temp: 100 F (37.8 C) [HN]  1643 Glucose-Capillary(!): 132 [HN]  1700 Urinalysis, w/ Reflex to Culture (Infection Suspected) -Urine, Clean Catch(!) +UTI [HN]  1732 Troponin I (High Sensitivity)(!): 38 [HN]  1732 Resp panel by RT-PCR (RSV, Flu A&B, Covid) Anterior Nasal Swab Neg [HN]  1733 WBC(!): 13.9 [HN]  1733 Comprehensive metabolic panel(!) Has had transaminitis in the past as well, she has no obvious RUQ TTP [HN]  1733 DG Chest 2 View No active cardiopulmonary abnormalities. [HN]  2052 Troponin I (High Sensitivity)(!): 33 <6 delta [HN]     Clinical Course User Index [HN] Audley Hose, MD    Labs: I Ordered, and personally interpreted labs.  The pertinent results include:  those listed above  Imaging Studies ordered: I ordered imaging studies including CXR I independently visualized and interpreted imaging. I agree with the radiologist interpretation  Additional history obtained from chart review, EMS.    Reevaluation: After the interventions noted above, I reevaluated the patient and found that they have :stayed the same  Social Determinants of Health: patient lives in facility  Disposition: Patient is vitally stable, afebrile.  She is UTI and mild leukocytosis.  She is treated with IV ceftriaxone here, DC'd with cefpodoxime. She is at her neurologic baseline per facility and husband at bedside. DC w/ discharge instructions/return precautions. All questions answered to patient's husbands satisfaction.    Co morbidities that complicate the patient evaluation  Past Medical History:  Diagnosis Date   Allergy    Asthma    > probably all pseudoasthma, -D/C ACE March 30,2010 > pseudowheeze resolved,cough resolved SPN RLL   Dementia ([place]) 2010   Dr. Krista Blue   Depression    Hypertension    Knee pain    Bilateral   Low back pain    Osteoarthritis    Vitiligo 2010     Medicines Meds ordered this encounter  Medications   cefTRIAXone (ROCEPHIN) 1 g in sodium chloride 0.9 % 100 mL IVPB    Order Specific Question:   Antibiotic Indication:    Answer:   UTI   cefpodoxime (VANTIN) 200 MG tablet    Sig: Take 1 tablet (200 mg total) by mouth 2 (two) times daily for 7 days.    Dispense:  14 tablet    Refill:  0    I have reviewed the patients home medicines and have made adjustments as needed  Problem List / ED Course: Problem List Items Addressed This Visit   None Visit Diagnoses     Acute cystitis with hematuria    -  Primary                   This note was created using dictation software,  which may contain spelling or grammatical errors.    Audley Hose, MD 05/24/22 [phone]

## 2022-05-24 NOTE — Discharge Instructions (Signed)
Thank you for coming to [facility] Emergency Department. You were seen for foul-smelling urine and generalized weakness. We did an exam, labs, and imaging, and these showed a UTI. We have prescribed the antibiotic cefpodoxime to take twice per day for 7 days..  Please follow up with your primary care provider within 1 week.   Do not hesitate to return to the ED or call 911 if you experience: -Worsening symptoms -Chest pain, SOB -Lightheadedness, passing out -Fevers/chills -Anything else that concerns you

## 2022-05-26 LAB — URINE CULTURE: Culture: >=100000 — AB

## 2022-05-27 ENCOUNTER — Telehealth (HOSPITAL_BASED_OUTPATIENT_CLINIC_OR_DEPARTMENT_OTHER): Payer: Self-pay | Admitting: Emergency Medicine

## 2022-05-27 NOTE — Telephone Encounter (Signed)
Post ED Visit - Positive Culture Follow-up  Culture report reviewed by antimicrobial stewardship pharmacist: [place] Team []  Elenor Quinones, Pharm.D. []  Heide Guile, Pharm.D., BCPS AQ-ID []  Parks Neptune, Pharm.D., BCPS []  Alycia Rossetti, Pharm.D., BCPS []  [place], Pharm.D., BCPS, AAHIVP []  Legrand Como, Pharm.D., BCPS, AAHIVP []  Salome Arnt, PharmD, BCPS []  Johnnette Gourd, PharmD, BCPS []  Hughes Better, PharmD, BCPS []  Leeroy Cha, PharmD []  Laqueta Linden, PharmD, BCPS []  Albertina Parr, PharmD  [place] Team []  Leodis Sias, PharmD []  Lindell Spar, PharmD []  Royetta Asal, PharmD []  Graylin Shiver, Rph []  Rema Fendt) Glennon Mac, PharmD []  Arlyn Dunning, PharmD []  Netta Cedars, PharmD []  Dia Sitter, PharmD []  Leone Haven, PharmD []  Gretta Arab, PharmD []  Theodis Shove, PharmD []  Peggyann Juba, PharmD []  Reuel Boom, PharmD   Positive urine culture Treated with cefpodoxime, organism sensitive to the same and no further patient follow-up is required at this time.  Hazle Nordmann 05/27/2022, 9:51 AM

## 2022-06-26 ENCOUNTER — Encounter (HOSPITAL_COMMUNITY): Payer: Self-pay | Admitting: Emergency Medicine

## 2022-06-26 ENCOUNTER — Emergency Department (HOSPITAL_COMMUNITY): Payer: Medicare Other

## 2022-06-26 ENCOUNTER — Emergency Department (HOSPITAL_COMMUNITY)
Admission: EM | Admit: 2022-06-26 | Discharge: 2022-06-26 | Disposition: A | Discharge status: Home / Self Care | Payer: Medicare Other | Attending: Emergency Medicine | Admitting: Emergency Medicine

## 2022-06-26 ENCOUNTER — Other Ambulatory Visit: Payer: Self-pay

## 2022-06-26 DIAGNOSIS — Z7901 Long term (current) use of anticoagulants: Secondary | ICD-10-CM | POA: Diagnosis not present

## 2022-06-26 DIAGNOSIS — W06XXXA Fall from bed, initial encounter: Secondary | ICD-10-CM | POA: Diagnosis not present

## 2022-06-26 DIAGNOSIS — S0990XA Unspecified injury of head, initial encounter: Secondary | ICD-10-CM | POA: Diagnosis present

## 2022-06-26 DIAGNOSIS — W19XXXA Unspecified fall, initial encounter: Secondary | ICD-10-CM

## 2022-06-26 DIAGNOSIS — F0390 Unspecified dementia without behavioral disturbance: Secondary | ICD-10-CM | POA: Insufficient documentation

## 2022-06-26 LAB — COMPREHENSIVE METABOLIC PANEL
ALT: 13 U/L (ref 0–44)
AST: 22 U/L (ref 15–41)
Albumin: 3 g/dL — ABNORMAL LOW (ref 3.5–5.0)
Alkaline Phosphatase: 91 U/L (ref 38–126)
Anion gap: 10 (ref 5–15)
BUN: 22 mg/dL (ref 8–23)
CO2: 24 mmol/L (ref 22–32)
Calcium: 9 mg/dL (ref 8.9–10.3)
Chloride: 105 mmol/L (ref 98–111)
Creatinine, Ser: 0.91 mg/dL (ref 0.44–1.00)
GFR, Estimated: >60 mL/min (ref >60)
Glucose, Bld: 100 mg/dL — ABNORMAL HIGH (ref 70–99)
Potassium: 4.2 mmol/L (ref 3.5–5.1)
Sodium: 139 mmol/L (ref 135–145)
Total Bilirubin: 0.5 mg/dL (ref 0.3–1.2)
Total Protein: 6.8 g/dL (ref 6.5–8.1)

## 2022-06-26 LAB — CBC
HCT: 35.4 % — ABNORMAL LOW (ref 36.0–46.0)
Hemoglobin: 10.9 g/dL — ABNORMAL LOW (ref 12.0–15.0)
MCH: 26.5 pg (ref 26.0–34.0)
MCHC: 30.8 g/dL (ref 30.0–36.0)
MCV: 85.9 fL (ref 80.0–100.0)
Platelets: 234 10*3/uL (ref 150–400)
RBC: 4.12 MIL/uL (ref 3.87–5.11)
RDW: 17.9 % — ABNORMAL HIGH (ref 11.5–15.5)
WBC: 9.2 10*3/uL (ref 4.0–10.5)
nRBC: 0 % (ref 0.0–0.2)

## 2022-06-26 LAB — PROTIME-INR
INR: 1.8 — ABNORMAL HIGH (ref 0.8–1.2)
Prothrombin Time: 20.4 seconds — ABNORMAL HIGH (ref 11.4–15.2)

## 2022-06-26 LAB — LACTIC ACID, PLASMA: Lactic Acid, Venous: 1.2 mmol/L (ref 0.5–1.9)

## 2022-06-26 NOTE — ED Triage Notes (Addendum)
Pt BIB EMS from [facility] found 1 hr ago in floor, hx of falling out of bed, hx of being combative. On xarelto. Unknown LOC, pt presenting at baseline hx of dementia. Hypertensive at facility SBP 170. EMS VS:  158/88 HR 62 RR 20 98% RA

## 2022-06-26 NOTE — ED Notes (Signed)
Report given to [facility] pt to be transported back to [facility].

## 2022-06-26 NOTE — Progress Notes (Signed)
   06/26/22 1708  Spiritual Encounters  Type of Visit Attempt (pt unavailable)  Care provided to: Pt not available  Conversation partners present during encounter Nurse;Physician  Referral source Trauma page  Reason for visit Trauma  OnCall Visit Yes   Responded to page. Patient fell on thinners. Arrive at patient's room and spoke with doctor and nurse. No family present as patient was brought via EMS after a fall at the nursing home. Patient has dementia, per doctor baseline non verbal. Patient taken for CT scan after Xrays. Doctor advised patient will be returned to nursing home unless other issue develops, no need to contact family. Nursing home is aware of her presence at [facility].

## 2022-06-26 NOTE — ED Provider Notes (Signed)
[place] EMERGENCY DEPARTMENT AT [facility] Provider Note   CSN: [id number] Arrival date & time: 06/26/22  1712     History  Chief Complaint  Patient presents with   Marletta Lor    Courtney Day is a 88 y.o. female.  HPI Patient presents via EMS after a fall.  She is a nursing home resident, has dementia, has baseline non verbal, but is awake and alert typically.  Reported the patient fell to the ground, unwitnessed, was held back to her bed, but with evidence for head trauma she was sent here for evaluation.  EMS reports no hemodynamic instability in transport, and per nursing staff patient has no change in interactivity. There was a small amount of bleeding from the right temporal head wound, this resolved prior to arrival to the ED. Level 5 caveat secondary to dementia.    Home Medications Prior to Admission medications   Medication Sig Start Date End Date Taking? Authorizing Provider  acetaminophen (TYLENOL) 325 MG tablet Take 2 tablets (650 mg total) by mouth every 6 (six) hours as needed for mild pain (or Fever >/= 101). 07/26/18   Vassie Loll, MD  Cholecalciferol (VITAMIN D3) 25 MCG (1000 UT) CAPS Take 1,000 Units by mouth daily.    [provider]  donepezil (ARICEPT) 23 MG TABS tablet Take 1 tablet (23 mg total) by mouth at bedtime. Patient not taking: Reported on 05/24/2022 06/14/17   Micki Riley, MD  Elastic Bandages & Supports (KNEE COMPRESSION SLEEVE/S/M) MISC 1 each by Does not apply route daily. Put on left leg daily and take off at night Patient not taking: Reported on 05/24/2022 07/06/18   Pincus Sanes, MD  hydrALAZINE (APRESOLINE) 10 MG tablet Take 1 tablet (10 mg total) by mouth 2 (two) times a day. Patient taking differently: Take 20 mg by mouth 3 (three) times daily. 07/26/18 05/24/22  Vassie Loll, MD  Infant Care Products [facility]) OINT Apply 1 application. topically See admin instructions. Apply ointment to sacrum every shift/three times a  day and after every incontinent episode    [provider]  rivaroxaban (XARELTO) 20 MG TABS tablet Take 1 tablet (20 mg total) by mouth daily with supper. Patient taking differently: Take 20 mg by mouth at bedtime. 07/26/18   Vassie Loll, MD  triamcinolone cream (KENALOG) 0.5 % Apply 1 application topically 3 (three) times daily. Patient not taking: Reported on 06/10/2021 03/07/18   Plotnikov, Georgina Quint, MD  Vitamin D, Ergocalciferol, (DRISDOL) 1.25 MG (50000 UT) CAPS capsule Take 1 capsule (50,000 Units total) by mouth every 30 (thirty) days. Patient not taking: Reported on 06/10/2021 08/23/18   Vassie Loll, MD      Allergies    Ace inhibitors, Amlodipine, Amlodipine besylate-valsartan, Namenda [memantine hcl], and Valsartan    Review of Systems   Review of Systems  Unable to perform ROS: Dementia    Physical Exam Updated Vital Signs BP (!) 188/65   Pulse (!) 57   Temp 97.7 F (36.5 C) (Oral)   Resp 19   Ht 5\' 2"  (1.575 m)   Wt 51.7 kg   SpO2 100%   BMI 20.85 kg/m  Physical Exam Vitals and nursing note reviewed.  Constitutional:      General: She is not in acute distress.    Appearance: She is well-developed. She is ill-appearing.  HENT:     Head: Normocephalic.   Eyes:     Conjunctiva/sclera: Conjunctivae normal.  Cardiovascular:     Rate  and Rhythm: Normal rate and regular rhythm.  Pulmonary:     Effort: Pulmonary effort is normal. No respiratory distress.     Breath sounds: Normal breath sounds. No stridor.  Abdominal:     General: There is no distension.  Skin:    General: Skin is warm and dry.  Neurological:     Mental Status: She is alert and oriented to person, place, and time.     Cranial Nerves: No cranial nerve deficit.  Psychiatric:        Cognition and Memory: Cognition is impaired. Memory is impaired.     Comments: Patient is awake, following activity, but nonverbal.  She is moving her extremities somewhat, spontaneously but definitely not  following commands.  Face is symmetric.     ED Results / Procedures / Treatments   Labs (all labs ordered are listed, but only abnormal results are displayed) Labs Reviewed  COMPREHENSIVE METABOLIC PANEL - Abnormal; Notable for the following components:      Result Value   Glucose, Bld 100 (*)    Albumin 3.0 (*)    All other components within normal limits  CBC - Abnormal; Notable for the following components:   Hemoglobin 10.9 (*)    HCT 35.4 (*)    RDW 17.9 (*)    All other components within normal limits  PROTIME-INR - Abnormal; Notable for the following components:   Prothrombin Time 20.4 (*)    INR 1.8 (*)    All other components within normal limits  LACTIC ACID, PLASMA    EKG None  Radiology CT HEAD WO CONTRAST  Result Date: 06/26/2022 CLINICAL DATA:  Trauma EXAM: CT HEAD WITHOUT CONTRAST CT CERVICAL SPINE WITHOUT CONTRAST TECHNIQUE: Multidetector CT imaging of the head and cervical spine was performed following the standard protocol without intravenous contrast. Multiplanar CT image reconstructions of the cervical spine were also generated. RADIATION DOSE REDUCTION: This exam was performed according to the departmental dose-optimization program which includes automated exposure control, adjustment of the mA and/or kV according to patient size and/or use of iterative reconstruction technique. COMPARISON:  CT Head 06/10/21 FINDINGS: CT HEAD FINDINGS Brain: No evidence of acute infarction, hemorrhage, hydrocephalus, extra-axial collection or mass lesion/mass effect. There is sequela of moderate chronic microvascular ischemic change with mild generalized volume loss. Vascular: No hyperdense vessel or unexpected calcification. Skull: Normal. Negative for fracture or focal lesion. Sinuses/Orbits: No middle ear or mastoid effusion. Paranasal sinuses are clear. Orbits are unremarkable. Other: None. CT CERVICAL SPINE FINDINGS Limitations: Assessment of the lower cervical spine is limited  due to motion artifact. Alignment: Trace retrolisthesis of C5 on C6. Skull base and vertebrae: No acute fracture. No primary bone lesion or focal pathologic process. Soft tissues and spinal canal: No prevertebral fluid or swelling. No visible canal hematoma. Disc levels:  No evidence of high-grade spinal canal stenosis. Upper chest: Negative. Other: None IMPRESSION: 1. No acute intracranial abnormality. 2. No acute fracture or traumatic malalignment of the cervical spine. Assessment of the lower cervical and visualized upper thoracic spine is limited due to motion artifact. Electronically Signed   By: Lorenza Cambridge M.D.   On: 06/26/2022 18:16   CT CERVICAL SPINE WO CONTRAST  Result Date: 06/26/2022 CLINICAL DATA:  Trauma EXAM: CT HEAD WITHOUT CONTRAST CT CERVICAL SPINE WITHOUT CONTRAST TECHNIQUE: Multidetector CT imaging of the head and cervical spine was performed following the standard protocol without intravenous contrast. Multiplanar CT image reconstructions of the cervical spine were also generated. RADIATION DOSE REDUCTION: This  exam was performed according to the departmental dose-optimization program which includes automated exposure control, adjustment of the mA and/or kV according to patient size and/or use of iterative reconstruction technique. COMPARISON:  CT Head 06/10/21 FINDINGS: CT HEAD FINDINGS Brain: No evidence of acute infarction, hemorrhage, hydrocephalus, extra-axial collection or mass lesion/mass effect. There is sequela of moderate chronic microvascular ischemic change with mild generalized volume loss. Vascular: No hyperdense vessel or unexpected calcification. Skull: Normal. Negative for fracture or focal lesion. Sinuses/Orbits: No middle ear or mastoid effusion. Paranasal sinuses are clear. Orbits are unremarkable. Other: None. CT CERVICAL SPINE FINDINGS Limitations: Assessment of the lower cervical spine is limited due to motion artifact. Alignment: Trace retrolisthesis of C5 on C6.  Skull base and vertebrae: No acute fracture. No primary bone lesion or focal pathologic process. Soft tissues and spinal canal: No prevertebral fluid or swelling. No visible canal hematoma. Disc levels:  No evidence of high-grade spinal canal stenosis. Upper chest: Negative. Other: None IMPRESSION: 1. No acute intracranial abnormality. 2. No acute fracture or traumatic malalignment of the cervical spine. Assessment of the lower cervical and visualized upper thoracic spine is limited due to motion artifact. Electronically Signed   By: Lorenza Cambridge M.D.   On: 06/26/2022 18:16   DG Pelvis Portable  Result Date: 06/26/2022 CLINICAL DATA:  Found down, altered level of consciousness EXAM: PORTABLE PELVIS 1-2 VIEWS COMPARISON:  None Available. FINDINGS: Frontal view of the pelvis was obtained, with the patient rotated slightly to the right. There are no acute displaced fractures. Alignment is anatomic. Mild symmetrical bilateral hip osteoarthritis. The sacroiliac joints are normal. Soft tissues are unremarkable. IMPRESSION: 1. No acute displaced fracture. 2. Mild symmetrical bilateral hip osteoarthritis. Electronically Signed   By: Sharlet Salina M.D.   On: 06/26/2022 17:33    Procedures Procedures    Medications Ordered in ED Medications - No data to display  ED Course/ Medical Decision Making/ A&P                             Medical Decision Making Elderly female with dementia presents after an unwitnessed fall with staff concern from nursing facility of head trauma.  Patient is a noncontributory historian.  Broad differential including dehydration, electrolyte abnormalities contributing to fall, with consideration of post traumatic effects including intracranial hemorrhage, fracture.    Amount and/or Complexity of Data Reviewed Independent Historian: EMS External Data Reviewed: notes. Labs: ordered. Decision-making details documented in ED Course. Radiology: ordered and independent  interpretation performed. Decision-making details documented in ED Course.  Risk Prescription drug management. Decision regarding hospitalization.   6:23 PM Patient in no distress similar condition as to arrival.  X-ray, CT, labs all reviewed, generally reassuring, given the patient's continued interaction in a typical manner, per nursing home and EMS notes low suspicion for occult bleed.  Given these reassuring findings, this adult female with history of dementia, who presented from her nursing facility after fall is appropriate for return to her facility.        Final Clinical Impression(s) / ED Diagnoses Final diagnoses:  Fall, initial encounter  Injury of head, initial encounter      Gerhard Munch, MD 06/26/22 [phone]

## 2022-06-26 NOTE — Discharge Instructions (Addendum)
Your evaluation today has been largely reassuring.  But, it is important that you monitor your condition carefully, and do not hesitate to return to the ED if you develop new, or concerning changes in your condition. ° °Otherwise, please follow-up with your physician for appropriate ongoing care. ° °

## 2022-09-11 ENCOUNTER — Encounter: Payer: Self-pay | Admitting: Podiatry

## 2022-09-11 ENCOUNTER — Ambulatory Visit (INDEPENDENT_AMBULATORY_CARE_PROVIDER_SITE_OTHER): Payer: Medicare Other | Admitting: Podiatry

## 2022-09-11 DIAGNOSIS — B351 Tinea unguium: Secondary | ICD-10-CM | POA: Diagnosis not present

## 2022-09-11 DIAGNOSIS — M79675 Pain in left toe(s): Secondary | ICD-10-CM

## 2022-09-11 DIAGNOSIS — M79674 Pain in right toe(s): Secondary | ICD-10-CM

## 2022-09-11 NOTE — Progress Notes (Addendum)
This patient returns to my office for at risk foot care.  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  She presents to the office in a wheelchair and with caregiver.This patient presents for at risk foot care today.  General Appearance  Alert, conversant and in no acute stress.  Vascular  Dorsalis pedis and posterior tibial  pulses are  weakly palpable  bilaterally.  Capillary return is within normal limits  bilaterally. Cold feet  bilaterally.  Neurologic deferred.  Nails Thick disfigured discolored nails with subungual debris  from hallux to fifth toes bilaterally. No evidence of bacterial infection or drainage bilaterally.  Orthopedic  No limitations of motion  feet .  No crepitus or effusions noted.  No bony pathology or digital deformities noted.  Skin  normotropic skin with no porokeratosis noted bilaterally.  No signs of infections or ulcers noted.     Onychomycosis  Pain in right toes  Pain in left toes  Consent was obtained for treatment procedures.   Mechanical debridement of nails 1-5  bilaterally performed with a nail nipper.  Filed with dremel without incident.    Return office visit   4-6 months                   Told patient to return for periodic foot care and evaluation due to potential at risk complications.   Helane Gunther DPM

## 2022-12-16 ENCOUNTER — Ambulatory Visit: Payer: Medicare Other | Admitting: Podiatry

## 2023-01-13 ENCOUNTER — Ambulatory Visit (INDEPENDENT_AMBULATORY_CARE_PROVIDER_SITE_OTHER): Payer: Medicare Other | Admitting: Podiatry

## 2023-01-13 DIAGNOSIS — M79675 Pain in left toe(s): Secondary | ICD-10-CM

## 2023-01-13 DIAGNOSIS — M79674 Pain in right toe(s): Secondary | ICD-10-CM

## 2023-01-13 DIAGNOSIS — B351 Tinea unguium: Secondary | ICD-10-CM | POA: Diagnosis not present

## 2023-01-13 NOTE — Progress Notes (Signed)
This patient returns to my office for at risk foot care.  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  She presents to the office in a wheelchair and with caregiver.This patient presents for at risk foot care today.  General Appearance  Alert, conversant and in no acute stress.  Vascular  Dorsalis pedis and posterior tibial  pulses are  weakly palpable  bilaterally.  Capillary return is within normal limits  bilaterally. Cold feet  bilaterally.  Neurologic deferred.  Nails Thick disfigured discolored nails with subungual debris  from hallux to fifth toes bilaterally. No evidence of bacterial infection or drainage bilaterally.  Orthopedic  No limitations of motion  feet .  No crepitus or effusions noted.  No bony pathology or digital deformities noted.  Skin  normotropic skin with no porokeratosis noted bilaterally.  No signs of infections or ulcers noted.     Onychomycosis  Pain in right toes  Pain in left toes  Consent was obtained for treatment procedures.   Mechanical debridement of nails 1-5  bilaterally performed with a nail nipper.  Filed with dremel without incident.    Return office visit   4-6 months                   Told patient to return for periodic foot care and evaluation due to potential at risk complications.   Helane Gunther DPM

## 2023-06-16 ENCOUNTER — Ambulatory Visit: Payer: Medicare Other | Admitting: Podiatry

## 2024-03-21 ENCOUNTER — Emergency Department (HOSPITAL_COMMUNITY)

## 2024-03-21 ENCOUNTER — Encounter (HOSPITAL_COMMUNITY): Payer: Self-pay

## 2024-03-21 ENCOUNTER — Inpatient Hospital Stay (HOSPITAL_COMMUNITY)
Admission: EM | Admit: 2024-03-21 | Discharge: 2024-03-23 | DRG: 640 | Disposition: A | Discharge status: Hospice | Source: Skilled Nursing Facility | Attending: Student | Admitting: Student

## 2024-03-21 ENCOUNTER — Other Ambulatory Visit: Payer: Self-pay

## 2024-03-21 DIAGNOSIS — N390 Urinary tract infection, site not specified: Secondary | ICD-10-CM | POA: Diagnosis present

## 2024-03-21 DIAGNOSIS — R627 Adult failure to thrive: Secondary | ICD-10-CM | POA: Diagnosis present

## 2024-03-21 DIAGNOSIS — I480 Paroxysmal atrial fibrillation: Secondary | ICD-10-CM | POA: Diagnosis present

## 2024-03-21 DIAGNOSIS — E861 Hypovolemia: Secondary | ICD-10-CM | POA: Diagnosis present

## 2024-03-21 DIAGNOSIS — Z8249 Family history of ischemic heart disease and other diseases of the circulatory system: Secondary | ICD-10-CM | POA: Diagnosis not present

## 2024-03-21 DIAGNOSIS — F02C0 Dementia in other diseases classified elsewhere, severe, without behavioral disturbance, psychotic disturbance, mood disturbance, and anxiety: Secondary | ICD-10-CM | POA: Diagnosis present

## 2024-03-21 DIAGNOSIS — E43 Unspecified severe protein-calorie malnutrition: Secondary | ICD-10-CM | POA: Diagnosis present

## 2024-03-21 DIAGNOSIS — Z7901 Long term (current) use of anticoagulants: Secondary | ICD-10-CM | POA: Diagnosis not present

## 2024-03-21 DIAGNOSIS — L89156 Pressure-induced deep tissue damage of sacral region: Secondary | ICD-10-CM | POA: Diagnosis present

## 2024-03-21 DIAGNOSIS — G309 Alzheimer's disease, unspecified: Secondary | ICD-10-CM | POA: Diagnosis present

## 2024-03-21 DIAGNOSIS — G9341 Metabolic encephalopathy: Secondary | ICD-10-CM | POA: Diagnosis present

## 2024-03-21 DIAGNOSIS — R64 Cachexia: Secondary | ICD-10-CM | POA: Diagnosis present

## 2024-03-21 DIAGNOSIS — E860 Dehydration: Secondary | ICD-10-CM | POA: Diagnosis present

## 2024-03-21 DIAGNOSIS — I10 Essential (primary) hypertension: Secondary | ICD-10-CM | POA: Diagnosis present

## 2024-03-21 DIAGNOSIS — Z7401 Bed confinement status: Secondary | ICD-10-CM

## 2024-03-21 DIAGNOSIS — E876 Hypokalemia: Secondary | ICD-10-CM | POA: Diagnosis present

## 2024-03-21 DIAGNOSIS — Z86718 Personal history of other venous thrombosis and embolism: Secondary | ICD-10-CM

## 2024-03-21 DIAGNOSIS — R633 Feeding difficulties, unspecified: Secondary | ICD-10-CM | POA: Diagnosis present

## 2024-03-21 DIAGNOSIS — L89896 Pressure-induced deep tissue damage of other site: Secondary | ICD-10-CM | POA: Diagnosis present

## 2024-03-21 DIAGNOSIS — Z681 Body mass index (BMI) 19 or less, adult: Secondary | ICD-10-CM | POA: Diagnosis not present

## 2024-03-21 DIAGNOSIS — Z7189 Other specified counseling: Secondary | ICD-10-CM

## 2024-03-21 DIAGNOSIS — E870 Hyperosmolality and hypernatremia: Secondary | ICD-10-CM | POA: Diagnosis present

## 2024-03-21 DIAGNOSIS — Z515 Encounter for palliative care: Secondary | ICD-10-CM

## 2024-03-21 DIAGNOSIS — Z79899 Other long term (current) drug therapy: Secondary | ICD-10-CM

## 2024-03-21 DIAGNOSIS — I959 Hypotension, unspecified: Secondary | ICD-10-CM | POA: Diagnosis not present

## 2024-03-21 DIAGNOSIS — J45909 Unspecified asthma, uncomplicated: Secondary | ICD-10-CM | POA: Diagnosis present

## 2024-03-21 DIAGNOSIS — Z90710 Acquired absence of both cervix and uterus: Secondary | ICD-10-CM

## 2024-03-21 DIAGNOSIS — Z888 Allergy status to other drugs, medicaments and biological substances status: Secondary | ICD-10-CM

## 2024-03-21 DIAGNOSIS — Z66 Do not resuscitate: Secondary | ICD-10-CM | POA: Diagnosis present

## 2024-03-21 LAB — CBC WITH DIFFERENTIAL/PLATELET
Abs Immature Granulocytes: 0.05 10*3/uL (ref 0.00–0.07)
Basophils Absolute: 0 10*3/uL (ref 0.0–0.1)
Basophils Relative: 0 %
Eosinophils Absolute: 0 10*3/uL (ref 0.0–0.5)
Eosinophils Relative: 0 %
HCT: 40.8 % (ref 36.0–46.0)
Hemoglobin: 12.1 g/dL (ref 12.0–15.0)
Immature Granulocytes: 0 %
Lymphocytes Relative: 12 %
Lymphs Abs: 1.5 10*3/uL (ref 0.7–4.0)
MCH: 27.4 pg (ref 26.0–34.0)
MCHC: 29.7 g/dL — ABNORMAL LOW (ref 30.0–36.0)
MCV: 92.5 fL (ref 80.0–100.0)
Monocytes Absolute: 0.6 10*3/uL (ref 0.1–1.0)
Monocytes Relative: 5 %
Neutro Abs: 10.7 10*3/uL — ABNORMAL HIGH (ref 1.7–7.7)
Neutrophils Relative %: 83 %
Platelets: 202 10*3/uL (ref 150–400)
RBC: 4.41 MIL/uL (ref 3.87–5.11)
RDW: 17 % — ABNORMAL HIGH (ref 11.5–15.5)
WBC: 12.9 10*3/uL — ABNORMAL HIGH (ref 4.0–10.5)
nRBC: 0 % (ref 0.0–0.2)

## 2024-03-21 LAB — URINALYSIS, ROUTINE W REFLEX MICROSCOPIC
Bilirubin Urine: NEGATIVE
Glucose, UA: NEGATIVE mg/dL
Ketones, ur: NEGATIVE mg/dL
Nitrite: POSITIVE — AB
Protein, ur: 100 mg/dL — AB
Specific Gravity, Urine: 1.045 — ABNORMAL HIGH (ref 1.005–1.030)
WBC, UA: >50 WBC/hpf (ref 0–5)
pH: 6 (ref 5.0–8.0)

## 2024-03-21 LAB — COMPREHENSIVE METABOLIC PANEL WITH GFR
ALT: 13 U/L (ref 0–44)
AST: 24 U/L (ref 15–41)
Albumin: 1.8 g/dL — ABNORMAL LOW (ref 3.5–5.0)
Alkaline Phosphatase: 36 U/L — ABNORMAL LOW (ref 38–126)
Anion gap: 19 — ABNORMAL HIGH (ref 5–15)
BUN: 41 mg/dL — ABNORMAL HIGH (ref 8–23)
CO2: 14 mmol/L — ABNORMAL LOW (ref 22–32)
Calcium: 7.7 mg/dL — ABNORMAL LOW (ref 8.9–10.3)
Chloride: 117 mmol/L — ABNORMAL HIGH (ref 98–111)
Creatinine, Ser: 0.41 mg/dL — ABNORMAL LOW (ref 0.44–1.00)
GFR, Estimated: >60 mL/min
Glucose, Bld: 70 mg/dL (ref 70–99)
Potassium: 3.7 mmol/L (ref 3.5–5.1)
Sodium: 150 mmol/L — ABNORMAL HIGH (ref 135–145)
Total Bilirubin: 0.8 mg/dL (ref 0.0–1.2)
Total Protein: 3.5 g/dL — ABNORMAL LOW (ref 6.5–8.1)

## 2024-03-21 LAB — LIPASE, BLOOD: Lipase: <10 U/L — ABNORMAL LOW (ref 11–51)

## 2024-03-21 LAB — MAGNESIUM: Magnesium: 1.4 mg/dL — ABNORMAL LOW (ref 1.7–2.4)

## 2024-03-21 MED ORDER — MAGNESIUM SULFATE 2 GM/50ML IV SOLN
2.0000 g | Freq: Once | INTRAVENOUS | Status: AC
  Administered 2024-03-21: 2 g via INTRAVENOUS
  Filled 2024-03-21: qty 50

## 2024-03-21 MED ORDER — LACTATED RINGERS IV BOLUS
1000.0000 mL | Freq: Once | INTRAVENOUS | Status: AC
  Administered 2024-03-21: 1000 mL via INTRAVENOUS

## 2024-03-21 MED ORDER — PROCHLORPERAZINE EDISYLATE 10 MG/2ML IJ SOLN: 5.0000 mg | INTRAMUSCULAR | Status: DC | PRN

## 2024-03-21 MED ORDER — IOHEXOL 350 MG/ML SOLN
80.0000 mL | Freq: Once | INTRAVENOUS | Status: AC | PRN
  Administered 2024-03-21: 80 mL via INTRAVENOUS

## 2024-03-21 MED ORDER — LACTATED RINGERS IV SOLN: INTRAVENOUS | Status: DC

## 2024-03-21 MED ORDER — ENOXAPARIN SODIUM 40 MG/0.4ML IJ SOSY: 40.0000 mg | PREFILLED_SYRINGE | INTRAMUSCULAR | Status: DC

## 2024-03-21 NOTE — ED Provider Notes (Signed)
 " [place] EMERGENCY DEPARTMENT AT [facility] Provider Note   CSN: [id number] Arrival date & time: 03/21/24  1806     History Chief Complaint  Patient presents with   Failure To Thrive    HPI: Courtney Day is a 89 y.o. female with history pertinent reported baseline of bedbound and nonverbal at baseline, Alzheimer's disease, hypertension, prior DVT, prior hyponatremia, atrial fibrillation who presents complaining of lack of p.o. intake. Patient arrived via EMS from [facility].  History provided by EMS.  No interpreter required during this encounter.  Patient's reported baseline is bedbound and nonverbal, reportedly is typically able to eat, however has been refusing p.o. today, therefore facility became concerned and brought her to the emergency department.  They did not note any other symptoms.  Patient is not verbally interactive or able to follow any commands, consistent with her nonverbal reported baseline.    Patient's recorded medical, surgical, social, medication list and allergies were reviewed in the Snapshot window as part of the initial history.   Prior to Admission medications  Medication Sig Start Date End Date Taking? Authorizing Provider  acetaminophen  (TYLENOL ) 325 MG tablet Take 2 tablets (650 mg total) by mouth every 6 (six) hours as needed for mild pain (or Fever >/= 101). 07/26/18  Yes Ricky Fines, MD  hydrALAZINE  (APRESOLINE ) 50 MG tablet Take 50 mg by mouth 2 (two) times daily. 03/11/24  Yes [provider]  mirtazapine (REMERON) 7.5 MG tablet Take 7.5 mg by mouth at bedtime. 03/07/24  Yes [provider]  rivaroxaban  (XARELTO ) 20 MG TABS tablet Take 1 tablet (20 mg total) by mouth daily with supper. Patient taking differently: Take 20 mg by mouth at bedtime. 07/26/18  Yes Ricky Fines, MD  Cholecalciferol (VITAMIN D3) 25 MCG (1000 UT) CAPS Take 1,000 Units by mouth daily. Patient not taking: Reported on 03/21/2024    [provider]  donepezil  (ARICEPT ) 23 MG TABS tablet Take 1 tablet (23 mg total) by mouth at bedtime. Patient not taking: Reported on 05/24/2022 06/14/17   Rosemarie Eather RAMAN, MD  Elastic Bandages & Supports (KNEE COMPRESSION SLEEVE/S/M) MISC 1 each by Does not apply route daily. Put on left leg daily and take off at night Patient not taking: Reported on 05/24/2022 07/06/18   Geofm Glade PARAS, MD  hydrALAZINE  (APRESOLINE ) 10 MG tablet Take 1 tablet (10 mg total) by mouth 2 (two) times a day. Patient not taking: Reported on 03/21/2024 07/26/18 05/24/22  Ricky Fines, MD  Infant Care Products [facility]) OINT Apply 1 application. topically See admin instructions. Apply ointment to sacrum every shift/three times a day and after every incontinent episode Patient not taking: Reported on 03/21/2024    [provider]  triamcinolone  cream (KENALOG ) 0.5 % Apply 1 application topically 3 (three) times daily. Patient not taking: Reported on 06/10/2021 03/07/18   Plotnikov, Karlynn GAILS, MD  Vitamin D , Ergocalciferol , (DRISDOL ) 1.25 MG (50000 UT) CAPS capsule Take 1 capsule (50,000 Units total) by mouth every 30 (thirty) days. Patient not taking: Reported on 06/10/2021 08/23/18   Ricky Fines, MD     Allergies: Ace inhibitors, Amlodipine , Amlodipine  besylate-valsartan, Namenda [memantine hcl], and Valsartan   Review of Systems   ROS as per HPI  Physical Exam Updated Vital Signs BP (!) 95/38 (BP Location: Left Arm)   Pulse 68   Temp 97.8 F (36.6 C) (Oral)   Resp 15   Ht 5' 2 (1.575 m)   Wt 37.8 kg  SpO2 100%   BMI 15.24 kg/m  Physical Exam Vitals and nursing note reviewed.  Constitutional:      Comments: Elderly and frail-appearing  HENT:     Head: Normocephalic and atraumatic.     Mouth/Throat:     Comments: Dry mucous membranes, dry cracked lips, significant plaque buildup on teeth Eyes:     Conjunctiva/sclera: Conjunctivae normal.  Cardiovascular:     Rate and Rhythm: Normal rate and  regular rhythm.     Heart sounds: No murmur heard. Pulmonary:     Effort: Pulmonary effort is normal. No respiratory distress.     Breath sounds: Normal breath sounds.  Abdominal:     Palpations: Abdomen is soft.     Tenderness: There is abdominal tenderness (Grimace to palpation diffusely).  Musculoskeletal:        General: No swelling.     Cervical back: Neck supple.  Skin:    General: Skin is warm and dry.     Capillary Refill: Capillary refill takes 2 to 3 seconds.     ED Course/ Medical Decision Making/ A&P    Procedures Procedures   Medications Ordered in ED Medications  acetaminophen  (TYLENOL ) tablet 650 mg (has no administration in time range)    Or  acetaminophen  (TYLENOL ) suppository 650 mg (has no administration in time range)  haloperidol  (HALDOL ) tablet 0.5 mg (has no administration in time range)    Or  haloperidol  (HALDOL ) 2 MG/ML solution 0.5 mg (has no administration in time range)    Or  haloperidol  lactate (HALDOL ) injection 0.5 mg (has no administration in time range)  ondansetron  (ZOFRAN -ODT) disintegrating tablet 4 mg (has no administration in time range)    Or  ondansetron  (ZOFRAN ) injection 4 mg (has no administration in time range)  glycopyrrolate  (ROBINUL ) tablet 1 mg (has no administration in time range)    Or  glycopyrrolate  (ROBINUL ) injection 0.2 mg (has no administration in time range)    Or  glycopyrrolate  (ROBINUL ) injection 0.2 mg (has no administration in time range)  antiseptic oral rinse (BIOTENE) solution 15 mL (has no administration in time range)  artificial tears ophthalmic solution 1 drop (has no administration in time range)  LORazepam  (ATIVAN ) tablet 1 mg (has no administration in time range)    Or  LORazepam  (ATIVAN ) 2 MG/ML concentrated solution 1 mg (has no administration in time range)    Or  LORazepam  (ATIVAN ) injection 1 mg (has no administration in time range)  morphine  (PF) 2 MG/ML injection 2 mg (has no  administration in time range)  lactated ringers  bolus 1,000 mL (1,000 mLs Intravenous New Bag/Given 03/21/24 2013)  iohexol  (OMNIPAQUE ) 350 MG/ML injection 80 mL (80 mLs Intravenous Contrast Given 03/21/24 2145)  magnesium  sulfate IVPB 2 g 50 mL (2 g Intravenous New Bag/Given 03/21/24 2235)  potassium chloride  10 mEq in 100 mL IVPB (10 mEq Intravenous New Bag/Given 03/22/24 0455)  lactated ringers  bolus 1,000 mL (0 mLs Intravenous Stopped 03/22/24 1210)    Medical Decision Making:   MALENE BLAYDES is a 89 y.o. female who presents for refusal for p.o. intake as per above.  Physical exam is pertinent for elderly and frail, cachectic, delayed capillary refill, dry cracked lips, dry mucous membranes.   The differential includes but is not limited to dehydration, progression of Alzheimer's, metabolic derangement, AKI, intra-abdominal infection, pulmonary embolism.  Independent historian: EMS  External data reviewed: Labs: reviewed prior labs for baseline  Initial Plan:  Screening labs including CBC and Metabolic panel to  evaluate for infectious or metabolic etiology of disease.  Magnesium  to evaluate for significant electrolyte derangement CT abdomen pelvis to evaluate for structural/infectious intra-abdominal pathology.  CT PE study to evaluate for PE given patient was initially tachycardic, has history of DVT and is bedbound at baseline Objective evaluation as below reviewed   Labs: Ordered, Independent interpretation, and Details: CBC with mild nonspecific leukocytosis.  Improvement of prior anemia, likely hemoconcentration.  No thrombocytopeniaCBC with hyponatremia to 150, which is not baseline for patient, no AKI, no emergent LFT abnormality, elevation of anion gap is consistent with dehydration  Radiology: Ordered, Independent interpretation, Details: CT PE study without large central PE or focal parenchymal abnormality.  CT of the abdomen pelvis without obstructive bowel gas pattern, free air,  significant free fluid, focal hyperenhancement., and All images reviewed independently.  Agree with radiology report at this time.   CT ABDOMEN PELVIS W CONTRAST Result Date: 03/21/2024 EXAM: CT ABDOMEN AND PELVIS WITH CONTRAST 03/21/2024 10:00:00 PM TECHNIQUE: CT of the abdomen and pelvis was performed with the administration of 40 ml omnipaque  350. Multiplanar reformatted images are provided for review. Automated exposure control, iterative reconstruction, and/or weight-based adjustment of the mA/kV was utilized to reduce the radiation dose to as low as reasonably achievable. COMPARISON: None available. CLINICAL HISTORY: Acute abdominal pain. FINDINGS: LOWER CHEST: No acute abnormality. LIVER: The liver demonstrates very mild central biliary ductal dilatation. GALLBLADDER AND BILE DUCTS: The gallbladder is well distended. The common bile duct appears within normal limits. SPLEEN: No acute abnormality. PANCREAS: No acute abnormality. ADRENAL GLANDS: No acute abnormality. KIDNEYS, URETERS AND BLADDER: Kidneys demonstrate no renal calculi or urinary tract obstructive changes. No perinephric or periureteral stranding. The bladder is partially distended. GI AND BOWEL: The small bowel and stomach are unremarkable. The appendix is within normal limits. The rectum shows considerable fecal material within, which may represent early rectal impaction. No wall thickening is seen. No obstructive changes in the colon are noted. PERITONEUM AND RETROPERITONEUM: No ascites. No free air. VASCULATURE: The abdominal aorta demonstrates atherosclerotic calcification without aneurysmal dilatation. LYMPH NODES: No lymphadenopathy. REPRODUCTIVE ORGANS: The uterus has been surgically removed. BONES AND SOFT TISSUES: The bony structures show degenerative change of the lumbar spine. No acute osseous abnormality. No focal soft tissue abnormality. IMPRESSION: 1. Considerable rectal stool burden, which may represent early rectal impaction. 2.  Very mild intrahepatic biliary ductal dilatation with normal caliber common bile duct, which can be further assessed with ultrasound if clinically indicated. Electronically signed by: Oneil Devonshire MD 03/21/2024 10:22 PM EST RP Workstation: [id number]   CT Angio Chest PE W and/or Wo Contrast Result Date: 03/21/2024 EXAM: CTA of the Chest with contrast for PE 03/21/2024 10:00:00 PM TECHNIQUE: CTA of the chest was performed without and with the administration of 80 mL of iohexol  (OMNIPAQUE ) 350 MG/ML intravenous contrast. Multiplanar reformatted images are provided for review. MIP images are provided for review. Automated exposure control, iterative reconstruction, and/or weight based adjustment of the mA/kV was utilized to reduce the radiation dose to as low as reasonably achievable. COMPARISON: None available. CLINICAL HISTORY: Difficulty swallowing. FINDINGS: PULMONARY ARTERIES: The pulmonary artery shows a normal branching pattern bilaterally. No intraluminal filling defect to suggest pulmonary embolism is noted. Main pulmonary artery is normal in caliber. MEDIASTINUM: The aorta demonstrates atherosclerotic calcification without aneurysmal dilatation. The heart is at the upper limits of normal in size. The esophagus is within normal limits. LYMPH NODES: No mediastinal, hilar or axillary lymphadenopathy. LUNGS AND PLEURA: Postsurgical changes are noted  in the right lower lobe. The lungs are well aerated bilaterally. No focal infiltrate or effusion is seen. No sizable parenchymal nodules are noted. No pneumothorax. UPPER ABDOMEN: Limited images of the upper abdomen are unremarkable. SOFT TISSUES AND BONES: No acute bone or soft tissue abnormality. IMPRESSION: 1. No pulmonary embolism. 2. No acute pulmonary abnormality. Electronically signed by: Oneil Devonshire MD 03/21/2024 10:19 PM EST RP Workstation: [id number]    EKG/Medicine tests: Not indicated EKG Interpretation:    Interventions: LR bolus, LR infusion,  magnesium  gtt.  See the EMR for full details regarding lab and imaging results.  Patient presents to the emergency department for refusal to take p.o., patient is unable to participate in history, however on exam, patient does have diffuse abdominal tenderness to palpation.  Patient was initially mildly tachycardic on arrival to 101, which in commendation with her history of DVT and bedbound status at baseline does raise my suspicion for pulmonary embolism, therefore do feel that patient warrants imaging as per initial plan above as well as labs.  Patient is clinically dehydrated appearing with dry cracked lips, tacky mucous membranes as well as decreased capillary refill, therefore will give initial 1 L LR bolus.  Labs obtained, do demonstrate significant hypomagnesemia, hyponatremia, as well as evidence of hemoconcentration on CBC, thus I feel that patient has dehydration, malnourishment that requires admission for electrolyte repletion as well as fluids, CT imaging does not reveal etiology of these changes, potentially related to progression of underlying Alzheimer's given patient's poor clinical baseline, do feel that patient warrants admission for further investigation of these possibilities.  Hospitalist consulted for admission, discussed with Dr. Shona who accepted the patient to her service.  No additional acute events while under my care.  Presentation is most consistent with acute complicated illness and Current presentation is complicated by underlying chronic conditions  Discussion of management or test interpretations with external provider(s): Dr. Shona, hospitalists  Risk Drugs:OTC drugs and Prescription drug management Treatment: Decision regarding hospitalization  Disposition: ADMIT: I believe the patient requires admission for further care and management. The patient was admitted to hospitalists. Please see inpatient provider note for additional treatment plan details.   MDM  generated using voice dictation software and may contain dictation errors.  Please contact me for any clarification or with any questions.  Clinical Impression:  1. Dehydration   2. Failure to thrive in adult   3. Hypomagnesemia   4. Hypernatremia      Admit   Final Clinical Impression(s) / ED Diagnoses Final diagnoses:  Dehydration  Failure to thrive in adult  Hypomagnesemia  Hypernatremia    Rx / DC Orders ED Discharge Orders     None        Rogelia Jerilynn RAMAN, MD 03/22/24 1448  "

## 2024-03-21 NOTE — H&P (Incomplete)
 " History and Physical  Courtney Day FMW:[id number] DOB: 1934-03-14 DOA: 03/21/2024  Referring physician: Dr. Rogelia, EDP  PCP: Pcp, No  Outpatient Specialists: Podiatry. Patient coming from: SNF.  Chief Complaint: Failure to thrive.  HPI: Courtney Day is a 89 y.o. female with medical history significant for Alzheimer's dementia, asthma, history of DVT on Xarelto , dysphagia on pured diet, who presents to the ER from SNF due to failure to thrive.  Reportedly, the patient stopped eating x 1 day.  At baseline, she is nonverbal and nonambulatory.  No family members at bedside.  Called the patient's spouse and updated him of her condition via phone.  He states that, he last saw her 2 days ago at the SNF and he was able to feed her a pured diet without difficulty.    The patient presented to the ER with a DNR form.  The patient's husband initially changed her CODE STATUS from DNR to full code.  After he discussed with the patient's bedside nurse, he reverted back to DNR.  In the ER, minimally responsive.  Hypovolemic and severely dehydrated with dry mucous membranes.  Lab studies notable for serum sodium of 150 repeated 165.  Initially started on LR in the ER due to hypovolemia which was later switched to D5W.  TRH, hospitalist service, was asked to admit for hypernatremia.  Palliative care medicine consulted to assist with establishing goals of care.  ED Course: Temperature 97.6.  BP 98/70, pulse 80, respiratory rate 22, O2 saturation 100% on room air.  Review of Systems: Review of systems as noted in the HPI. All other systems reviewed and are negative.   Past Medical History:  Diagnosis Date   Allergy    Asthma    > probably all pseudoasthma, -D/C ACE March 30,2010 > pseudowheeze resolved,cough resolved SPN RLL   Dementia (HCC) 2010   Dr. Onita   Depression    Hypertension    Knee pain    Bilateral   Low back pain    Osteoarthritis    Vitiligo 2010   Past Surgical History:   Procedure Laterality Date   ABDOMINAL HYSTERECTOMY     hystoplasmosis lung biopsy- 2010 Dr. Michae      Social History:  reports that she has never smoked. She has never used smokeless tobacco. She reports that she does not drink alcohol  and does not use drugs.   Allergies[1]  Family History  Problem Relation Age of Onset   Cancer Mother    Cancer Sister        smoker   Multiple sclerosis Son    Hypertension Other       Prior to Admission medications  Medication Sig Start Date End Date Taking? Authorizing Provider  acetaminophen  (TYLENOL ) 325 MG tablet Take 2 tablets (650 mg total) by mouth every 6 (six) hours as needed for mild pain (or Fever >/= 101). 07/26/18  Yes Ricky Fines, MD  hydrALAZINE  (APRESOLINE ) 50 MG tablet Take 50 mg by mouth 2 (two) times daily. 03/11/24  Yes [provider]  mirtazapine (REMERON) 7.5 MG tablet Take 7.5 mg by mouth at bedtime. 03/07/24  Yes [provider]  rivaroxaban  (XARELTO ) 20 MG TABS tablet Take 1 tablet (20 mg total) by mouth daily with supper. Patient taking differently: Take 20 mg by mouth at bedtime. 07/26/18  Yes Ricky Fines, MD  Cholecalciferol (VITAMIN D3) 25 MCG (1000 UT) CAPS Take 1,000 Units by mouth daily. Patient not taking: Reported on 03/21/2024  [provider]  donepezil  (ARICEPT ) 23 MG TABS tablet Take 1 tablet (23 mg total) by mouth at bedtime. Patient not taking: Reported on 05/24/2022 06/14/17   Rosemarie Eather RAMAN, MD  Elastic Bandages & Supports (KNEE COMPRESSION SLEEVE/S/M) MISC 1 each by Does not apply route daily. Put on left leg daily and take off at night Patient not taking: Reported on 05/24/2022 07/06/18   Geofm Glade PARAS, MD  hydrALAZINE  (APRESOLINE ) 10 MG tablet Take 1 tablet (10 mg total) by mouth 2 (two) times a day. Patient not taking: Reported on 03/21/2024 07/26/18 05/24/22  Ricky Fines, MD  Infant Care Products [facility]) OINT Apply 1 application. topically See admin instructions.  Apply ointment to sacrum every shift/three times a day and after every incontinent episode Patient not taking: Reported on 03/21/2024    [provider]  triamcinolone  cream (KENALOG ) 0.5 % Apply 1 application topically 3 (three) times daily. Patient not taking: Reported on 06/10/2021 03/07/18   Plotnikov, Karlynn GAILS, MD  Vitamin D , Ergocalciferol , (DRISDOL ) 1.25 MG (50000 UT) CAPS capsule Take 1 capsule (50,000 Units total) by mouth every 30 (thirty) days. Patient not taking: Reported on 06/10/2021 08/23/18   Ricky Fines, MD    Physical Exam: BP 114/60   Pulse 87   Temp 97.9 F (36.6 C) (Axillary)   Resp 16   SpO2 95%   General: 89 y.o. year-old female very frail appearing in no acute distress.  Minimally responsive.   Cardiovascular: Regular rate and rhythm with no rubs or gallops.  No thyromegaly or JVD noted.  No lower extremity edema.  Respiratory: Clear to auscultation with no wheezes or rales.  Poor inspiratory effort. Abdomen: Soft nontender nondistended with normal bowel sounds x4 quadrants. Muskuloskeletal: No cyanosis, clubbing or edema noted bilaterally Neuro: CN II-XII intact, strength, sensation, reflexes Skin: No ulcerative lesions noted or rashes Psychiatry: Unable to assess judgment on mood due to minimal responsiveness.         Labs on Admission:  Basic Metabolic Panel: Recent Labs  Lab 03/21/24 2036  NA 150*  K 3.7  CL 117*  CO2 14*  GLUCOSE 70  BUN 41*  CREATININE 0.41*  CALCIUM 7.7*  MG 1.4*   Liver Function Tests: Recent Labs  Lab 03/21/24 2036  AST 24  ALT 13  ALKPHOS 36*  BILITOT 0.8  PROT 3.5*  ALBUMIN 1.8*   Recent Labs  Lab 03/21/24 2036  LIPASE <10*   No results for input(s): AMMONIA in the last 168 hours. CBC: Recent Labs  Lab 03/21/24 1846  WBC 12.9*  NEUTROABS 10.7*  HGB 12.1  HCT 40.8  MCV 92.5  PLT 202   Cardiac Enzymes: No results for input(s): CKTOTAL, CKMB, CKMBINDEX, TROPONINI in the last 168  hours.  BNP (last 3 results) No results for input(s): BNP in the last 8760 hours.  ProBNP (last 3 results) No results for input(s): PROBNP in the last 8760 hours.  CBG: No results for input(s): GLUCAP in the last 168 hours.  Radiological Exams on Admission: CT ABDOMEN PELVIS W CONTRAST Result Date: 03/21/2024 EXAM: CT ABDOMEN AND PELVIS WITH CONTRAST 03/21/2024 10:00:00 PM TECHNIQUE: CT of the abdomen and pelvis was performed with the administration of 40 ml omnipaque  350. Multiplanar reformatted images are provided for review. Automated exposure control, iterative reconstruction, and/or weight-based adjustment of the mA/kV was utilized to reduce the radiation dose to as low as reasonably achievable. COMPARISON: None available. CLINICAL HISTORY: Acute abdominal pain. FINDINGS: LOWER CHEST: No acute abnormality.  LIVER: The liver demonstrates very mild central biliary ductal dilatation. GALLBLADDER AND BILE DUCTS: The gallbladder is well distended. The common bile duct appears within normal limits. SPLEEN: No acute abnormality. PANCREAS: No acute abnormality. ADRENAL GLANDS: No acute abnormality. KIDNEYS, URETERS AND BLADDER: Kidneys demonstrate no renal calculi or urinary tract obstructive changes. No perinephric or periureteral stranding. The bladder is partially distended. GI AND BOWEL: The small bowel and stomach are unremarkable. The appendix is within normal limits. The rectum shows considerable fecal material within, which may represent early rectal impaction. No wall thickening is seen. No obstructive changes in the colon are noted. PERITONEUM AND RETROPERITONEUM: No ascites. No free air. VASCULATURE: The abdominal aorta demonstrates atherosclerotic calcification without aneurysmal dilatation. LYMPH NODES: No lymphadenopathy. REPRODUCTIVE ORGANS: The uterus has been surgically removed. BONES AND SOFT TISSUES: The bony structures show degenerative change of the lumbar spine. No acute osseous  abnormality. No focal soft tissue abnormality. IMPRESSION: 1. Considerable rectal stool burden, which may represent early rectal impaction. 2. Very mild intrahepatic biliary ductal dilatation with normal caliber common bile duct, which can be further assessed with ultrasound if clinically indicated. Electronically signed by: Oneil Devonshire MD 03/21/2024 10:22 PM EST RP Workstation: [id number]   CT Angio Chest PE W and/or Wo Contrast Result Date: 03/21/2024 EXAM: CTA of the Chest with contrast for PE 03/21/2024 10:00:00 PM TECHNIQUE: CTA of the chest was performed without and with the administration of 80 mL of iohexol  (OMNIPAQUE ) 350 MG/ML intravenous contrast. Multiplanar reformatted images are provided for review. MIP images are provided for review. Automated exposure control, iterative reconstruction, and/or weight based adjustment of the mA/kV was utilized to reduce the radiation dose to as low as reasonably achievable. COMPARISON: None available. CLINICAL HISTORY: Difficulty swallowing. FINDINGS: PULMONARY ARTERIES: The pulmonary artery shows a normal branching pattern bilaterally. No intraluminal filling defect to suggest pulmonary embolism is noted. Main pulmonary artery is normal in caliber. MEDIASTINUM: The aorta demonstrates atherosclerotic calcification without aneurysmal dilatation. The heart is at the upper limits of normal in size. The esophagus is within normal limits. LYMPH NODES: No mediastinal, hilar or axillary lymphadenopathy. LUNGS AND PLEURA: Postsurgical changes are noted in the right lower lobe. The lungs are well aerated bilaterally. No focal infiltrate or effusion is seen. No sizable parenchymal nodules are noted. No pneumothorax. UPPER ABDOMEN: Limited images of the upper abdomen are unremarkable. SOFT TISSUES AND BONES: No acute bone or soft tissue abnormality. IMPRESSION: 1. No pulmonary embolism. 2. No acute pulmonary abnormality. Electronically signed by: Oneil Devonshire MD 03/21/2024  10:19 PM EST RP Workstation: [id number]    EKG: I independently viewed the EKG done and my findings are as followed: None available at the time of this visit.  Assessment/Plan Present on Admission:  Failure to thrive in adult  Principal Problem:   Failure to thrive in adult  Failure to thrive in adult Currently minimally responsive, n.p.o. until Passes a swallow evaluation at bedside. Gentle IV fluid hydration D5W Resume feeding when can pass a swallow evaluation. Aspiration precautions.  Severe hypovolemic hypernatremia likely secondary to severe dehydration with poor oral intake Serum sodium 165 Continue D5W at 75 cc/h x 1 day BMP every 4 hours Avoid quick correction of hypernatremia  Presumptive UTI, POA UA positive for pyuria Follow urine culture Rocephin  empirically  Acute metabolic encephalopathy in the setting of severe hypernatremia and possible UTI, POA Continue to treat underlying conditions Once more alert, reorient as needed Fall precautions.  Alzheimer's dementia Goals of care Palliative care medicine  consulted to assist with establishing goals of care Currently DNR  Hypokalemia Serum potassium 3.4 Repleted intravenously  History of DVT On Xarelto  prior to admission Currently unable to swallow Full dose Lovenox  twice daily   Time: 75 minutes.   DVT prophylaxis: Subcu full dose Lovenox  twice daily  Code Status: DNR/DNI.  Family Communication: Updated patient's spouse at via phone x 2  Disposition Plan: Admitted to progressive care unit.  Consults called: Palliative care medicine.  Admission status: Inpatient status.   Status is: Inpatient The patient requires at least 2 midnights for further evaluation and treatment of present condition.   Terry LOISE Hurst MD [facility] Pager [phone]  If 7PM-7AM, please contact night-coverage [email or web address] Password [facility]  03/21/2024, 11:43 PM      [1]  Allergies Allergen Reactions    Ace Inhibitors Other (See Comments) and Cough    Allergic, per MAR   Amlodipine  Other (See Comments)    Dizziness and Allergic, per MAR   Amlodipine  Besylate-Valsartan Other (See Comments)    Dizziness and Allergic, per [facility]   Namenda [Memantine Hcl] Other (See Comments)    Allergic, per MAR   Valsartan Other (See Comments)    Dizziness and Allergic, per [facility]   "

## 2024-03-21 NOTE — ED Triage Notes (Addendum)
 Pt BIB EMS from [place] with reports of FTT. Pt has not been eating today and unable to swallow. Pt does not walk or talk per baseline.

## 2024-03-22 DIAGNOSIS — E870 Hyperosmolality and hypernatremia: Secondary | ICD-10-CM | POA: Diagnosis not present

## 2024-03-22 DIAGNOSIS — Z7189 Other specified counseling: Secondary | ICD-10-CM

## 2024-03-22 DIAGNOSIS — R627 Adult failure to thrive: Secondary | ICD-10-CM | POA: Diagnosis not present

## 2024-03-22 DIAGNOSIS — E860 Dehydration: Secondary | ICD-10-CM | POA: Diagnosis not present

## 2024-03-22 LAB — CBC WITH DIFFERENTIAL/PLATELET
Abs Immature Granulocytes: 0.04 10*3/uL (ref 0.00–0.07)
Basophils Absolute: 0 10*3/uL (ref 0.0–0.1)
Basophils Relative: 0 %
Eosinophils Absolute: 0 10*3/uL (ref 0.0–0.5)
Eosinophils Relative: 0 %
HCT: 37.1 % (ref 36.0–46.0)
Hemoglobin: 10.8 g/dL — ABNORMAL LOW (ref 12.0–15.0)
Immature Granulocytes: 0 %
Lymphocytes Relative: 14 %
Lymphs Abs: 1.4 10*3/uL (ref 0.7–4.0)
MCH: 26.8 pg (ref 26.0–34.0)
MCHC: 29.1 g/dL — ABNORMAL LOW (ref 30.0–36.0)
MCV: 92.1 fL (ref 80.0–100.0)
Monocytes Absolute: 0.6 10*3/uL (ref 0.1–1.0)
Monocytes Relative: 6 %
Neutro Abs: 7.7 10*3/uL (ref 1.7–7.7)
Neutrophils Relative %: 80 %
Platelets: 174 10*3/uL (ref 150–400)
RBC: 4.03 MIL/uL (ref 3.87–5.11)
RDW: 16.7 % — ABNORMAL HIGH (ref 11.5–15.5)
WBC: 9.7 10*3/uL (ref 4.0–10.5)
nRBC: 0 % (ref 0.0–0.2)

## 2024-03-22 LAB — PHOSPHORUS: Phosphorus: 2.7 mg/dL (ref 2.5–4.6)

## 2024-03-22 LAB — BLOOD GAS, VENOUS
Acid-Base Excess: 2.8 mmol/L — ABNORMAL HIGH (ref 0.0–2.0)
Bicarbonate: 28.9 mmol/L — ABNORMAL HIGH (ref 20.0–28.0)
Drawn by: 69686
O2 Saturation: 63.4 %
Patient temperature: 36.8
pCO2, Ven: 50 mmHg (ref 44–60)
pH, Ven: 7.37 (ref 7.25–7.43)
pO2, Ven: 36 mmHg (ref 32–45)

## 2024-03-22 LAB — LACTIC ACID, PLASMA
Lactic Acid, Venous: 2.9 mmol/L (ref 0.5–1.9)
Lactic Acid, Venous: 2.5 mmol/L (ref 0.5–1.9)

## 2024-03-22 LAB — BASIC METABOLIC PANEL WITH GFR
Anion gap: 12 (ref 5–15)
BUN: 66 mg/dL — ABNORMAL HIGH (ref 8–23)
CO2: 26 mmol/L (ref 22–32)
Calcium: 9.5 mg/dL (ref 8.9–10.3)
Chloride: 127 mmol/L — ABNORMAL HIGH (ref 98–111)
Creatinine, Ser: 0.83 mg/dL (ref 0.44–1.00)
GFR, Estimated: >60 mL/min
Glucose, Bld: 122 mg/dL — ABNORMAL HIGH (ref 70–99)
Potassium: 3.4 mmol/L — ABNORMAL LOW (ref 3.5–5.1)
Sodium: 165 mmol/L (ref 135–145)

## 2024-03-22 LAB — MAGNESIUM
Magnesium: 3.4 mg/dL — ABNORMAL HIGH (ref 1.7–2.4)
Magnesium: 3.3 mg/dL — ABNORMAL HIGH (ref 1.7–2.4)

## 2024-03-22 LAB — GLUCOSE, CAPILLARY
Glucose-Capillary: 102 mg/dL — ABNORMAL HIGH (ref 70–99)
Glucose-Capillary: 125 mg/dL — ABNORMAL HIGH (ref 70–99)
Glucose-Capillary: 134 mg/dL — ABNORMAL HIGH (ref 70–99)
Glucose-Capillary: 96 mg/dL (ref 70–99)

## 2024-03-22 MED ORDER — LACTATED RINGERS IV BOLUS
1000.0000 mL | Freq: Once | INTRAVENOUS | Status: AC
  Administered 2024-03-22: 1000 mL via INTRAVENOUS

## 2024-03-22 MED ORDER — LORAZEPAM 2 MG/ML IJ SOLN
1.0000 mg | INTRAMUSCULAR | Status: DC | PRN
  Administered 2024-03-23: 1 mg via INTRAVENOUS
  Filled 2024-03-22: qty 1

## 2024-03-22 MED ORDER — POTASSIUM CHLORIDE 10 MEQ/100ML IV SOLN
10.0000 meq | INTRAVENOUS | Status: AC
  Administered 2024-03-22 (×2): 10 meq via INTRAVENOUS
  Filled 2024-03-22 (×2): qty 100

## 2024-03-22 MED ORDER — ENOXAPARIN SODIUM 40 MG/0.4ML IJ SOSY
40.0000 mg | PREFILLED_SYRINGE | INTRAMUSCULAR | Status: DC
  Administered 2024-03-22: 40 mg via SUBCUTANEOUS
  Filled 2024-03-22: qty 0.4

## 2024-03-22 MED ORDER — ONDANSETRON 4 MG PO TBDP: 4.0000 mg | ORAL_TABLET | Freq: Four times a day (QID) | ORAL | Status: DC | PRN

## 2024-03-22 MED ORDER — ENOXAPARIN SODIUM 30 MG/0.3ML IJ SOSY: 30.0000 mg | PREFILLED_SYRINGE | INTRAMUSCULAR | Status: DC

## 2024-03-22 MED ORDER — HALOPERIDOL 0.5 MG PO TABS: 0.5000 mg | ORAL_TABLET | ORAL | Status: DC | PRN

## 2024-03-22 MED ORDER — HALOPERIDOL LACTATE 5 MG/ML IJ SOLN: 0.5000 mg | INTRAMUSCULAR | Status: DC | PRN

## 2024-03-22 MED ORDER — POLYVINYL ALCOHOL 1.4 % OP SOLN: 1.0000 [drp] | Freq: Four times a day (QID) | OPHTHALMIC | Status: DC | PRN

## 2024-03-22 MED ORDER — VANCOMYCIN HCL 750 MG/150ML IV SOLN
750.0000 mg | Freq: Once | INTRAVENOUS | Status: DC
  Filled 2024-03-22: qty 150

## 2024-03-22 MED ORDER — MORPHINE SULFATE (PF) 2 MG/ML IV SOLN: 2.0000 mg | INTRAVENOUS | Status: DC | PRN

## 2024-03-22 MED ORDER — ONDANSETRON HCL 4 MG/2ML IJ SOLN: 4.0000 mg | Freq: Four times a day (QID) | INTRAMUSCULAR | Status: DC | PRN

## 2024-03-22 MED ORDER — HALOPERIDOL LACTATE 2 MG/ML PO CONC: 0.5000 mg | ORAL | Status: DC | PRN

## 2024-03-22 MED ORDER — LORAZEPAM 2 MG/ML PO CONC: 1.0000 mg | ORAL | Status: DC | PRN

## 2024-03-22 MED ORDER — LORAZEPAM 1 MG PO TABS: 1.0000 mg | ORAL_TABLET | ORAL | Status: DC | PRN

## 2024-03-22 MED ORDER — BIOTENE DRY MOUTH MT LIQD: 15.0000 mL | OROMUCOSAL | Status: DC | PRN

## 2024-03-22 MED ORDER — GLYCOPYRROLATE 0.2 MG/ML IJ SOLN
0.2000 mg | INTRAMUSCULAR | Status: DC | PRN
  Administered 2024-03-23: 0.2 mg via INTRAVENOUS
  Filled 2024-03-22 (×2): qty 1

## 2024-03-22 MED ORDER — DEXTROSE 5 % IV SOLN: INTRAVENOUS | Status: DC

## 2024-03-22 MED ORDER — ACETAMINOPHEN 325 MG PO TABS
650.0000 mg | ORAL_TABLET | Freq: Four times a day (QID) | ORAL | Status: DC | PRN
  Filled 2024-03-22: qty 2

## 2024-03-22 MED ORDER — SODIUM CHLORIDE 0.9 % IV SOLN
1.0000 g | INTRAVENOUS | Status: DC
  Administered 2024-03-22: 1 g via INTRAVENOUS
  Filled 2024-03-22: qty 10

## 2024-03-22 MED ORDER — ACETAMINOPHEN 650 MG RE SUPP: 650.0000 mg | Freq: Four times a day (QID) | RECTAL | Status: DC | PRN

## 2024-03-22 MED ORDER — GLYCOPYRROLATE 1 MG PO TABS: 1.0000 mg | ORAL_TABLET | ORAL | Status: DC | PRN

## 2024-03-22 MED ORDER — GLYCOPYRROLATE 0.2 MG/ML IJ SOLN: 0.2000 mg | INTRAMUSCULAR | Status: DC | PRN

## 2024-03-22 MED ORDER — VANCOMYCIN HCL 750 MG/150ML IV SOLN: 750.0000 mg | INTRAVENOUS | Status: DC

## 2024-03-22 NOTE — Progress Notes (Signed)
 Pharmacy Antibiotic Note  Courtney Day is a 89 y.o. female admitted on 03/21/2024 with sepsis. Pharmacy has been consulted for vancomycin  dosing.  Plan: Give vancomycin  IV 750mg  x1, followed by vancomycin  750mg  IV q48h (eAUC 491 using Scr 0.83, TBW, Vd 0.72) Vanc levels PRN Monitor renal function, cultures, and overall clinical picture De-escalate abx as able  Height: 5' 2 (157.5 cm) Weight: 37.8 kg (83 lb 5.3 oz) IBW/kg (Calculated) : 50.1  Temp (24hrs), Avg:98 F (36.7 C), Min:97.5 F (36.4 C), Max:98.9 F (37.2 C)  Recent Labs  Lab 03/21/24 1846 03/21/24 2036 03/22/24 0154 03/22/24 0952 03/22/24 1114  WBC 12.9*  --  9.7  --   --   CREATININE  --  0.41* 0.83  --   --   LATICACIDVEN  --   --   --  2.9* 2.5*    Estimated Creatinine Clearance: 28.5 mL/min (by C-G formula based on SCr of 0.83 mg/dL).    Allergies[1]  Antimicrobials this admission: 1/28 CRO >>  1/28 vanc >>   Dose adjustments this admission: N/A  Microbiology results: 1/28 BCx: ip 1/27 UCx: ip    Thank you for allowing pharmacy to be a part of this patients care.  Lacinda Moats, PharmD Clinical Pharmacist  1/28/202612:27 PM    [1]  Allergies Allergen Reactions   Ace Inhibitors Other (See Comments) and Cough    Allergic, per MAR   Amlodipine  Other (See Comments)    Dizziness and Allergic, per [facility]   Amlodipine  Besylate-Valsartan Other (See Comments)    Dizziness and Allergic, per [facility]   Namenda [Memantine Hcl] Other (See Comments)    Allergic, per MAR   Valsartan Other (See Comments)    Dizziness and Allergic, per [facility]

## 2024-03-22 NOTE — Progress Notes (Signed)
 " PROGRESS NOTE  Courtney Day FMW:[id number] DOB: 11/30/1934   PCP: Pcp, No  Patient is from: Nursing home  DOA: 03/21/2024 LOS: 1  Chief complaints Chief Complaint  Patient presents with   Failure To Thrive     Brief Narrative / Interim history: 89 year old F with PMH of Alzheimer's dementia nonverbal and nonambulatory at baseline, dysphagia on p.o. diet, paroxysmal A-fib, DVT on Xarelto , asthma and pulmonary nodule brought to ED after she stopped eating for about a day, and admitted with failure to thrive with severe dehydration and hypernatremia.  In ED, minimally responsive.  Stable vitals.  Very dehydrated with dry mucous membrane. Na 150 but trended up to 165 for repeat after LR fluid.  She was started on D5 water and admitted for further care.  Palliative medicine consulted.  Subjective: Seen and examined earlier this morning.  Hypotensive to 83/57 and started on LR bolus.  Patient is nonverbal.  Does not follow commands.  Does not appear to be in distress.   Assessment and plan: Failure to thrive in adult: On pured diet.  Stopped eating for a day prior to presentation. Severe Alzheimer's dementia without behavioral disturbance -N.p.o. pending SLP eval. -Continue D5W at 75 cc an hour. -Aspiration precaution -Follow-up palliative input   Severe hypovolemic hypernatremia: Likely secondary to severe dehydration with poor oral intake -1 L LR bolus for hypotension -Continue D5W at 75 cc an hour -Morning labs yet to be drawn.  Notified RN to call phlebotomy.  Presumptive UTI, POA: UA consistent with UTI. -Add urine culture to previous sample -Check blood culture and lactic acid -Continue IV ceftriaxone   Hypotension: Multifactorial including dehydration and possible infection. -IV fluid as above - Check blood culture and lactic acid -Continue antibiotics as above -Hold home hydralazine .   Acute metabolic encephalopathy?  Could be due to hypovolemia, hypernatremia and  UTI.  Patient is nonverbal and nonambulatory at baseline. -Manage dehydration, hypernatremia and UTI as above -Minimize sedating medications.   Hypokalemia/hypomagnesemia -Monitor replenish K and Mg as appropriate   History of DVT/paroxysmal A-fib: Currently regular rhythm on exam. -On IV heparin until able to swallow safely  Severe malnutrition Body mass index is 15.24 kg/m. - Consult dietitian          DVT prophylaxis:  enoxaparin  (LOVENOX ) injection 40 mg Start: 03/22/24 1000  Code Status: DNR Family Communication: None at bedside Level of care: Progressive Status is: Inpatient Remains inpatient appropriate because: Failure to thrive, hypernatremia, dehydration   Final disposition: Yet to be determined   55 minutes with more than 50% spent in reviewing records, counseling patient/family and coordinating care.  Consultants:  Palliative medicine  Procedures: None  Microbiology summarized: Urine culture pending Blood culture  Objective: Vitals:   03/22/24 0100 03/22/24 0530 03/22/24 0750 03/22/24 0850  BP: 102/89 98/70 (!) 83/57 (!) 87/56  Pulse: 82 80 (!) 102 69  Resp: 20 (!) 22 (!) 22   Temp: 98.2 F (36.8 C) 97.6 F (36.4 C) 97.8 F (36.6 C) 97.8 F (36.6 C)  TempSrc: Oral  Axillary Axillary  SpO2: 94% 100% 96% 99%  Weight: 37.8 kg     Height: 5' 2 (1.575 m)       Examination:  GENERAL: No apparent distress.  Appears frail. HEENT: MMM.  Vision and hearing grossly intact.  NECK: Supple.  No apparent JVD.  RESP:  No IWOB.  Fair aeration bilaterally. CVS:  RRR. Heart sounds normal.  ABD/GI/GU: BS+. Abd soft, NTND.  MSK/EXT:  No apparent  deformity.  Significant muscle mass and subcu fat loss. SKIN: no apparent skin lesion or wound NEURO: Awake and alert.  Nonverbal.  Does not follow commands.  PSYCH: Calm. Normal affect.   Sch Meds:  Scheduled Meds:  enoxaparin  (LOVENOX ) injection  40 mg Subcutaneous Q24H   Continuous Infusions:   cefTRIAXone  (ROCEPHIN )  IV 1 g (03/22/24 0644)   dextrose  75 mL/hr at 03/22/24 0253   PRN Meds:.prochlorperazine   Antimicrobials: Anti-infectives (From admission, onward)    Start     Dose/Rate Route Frequency Ordered Stop   03/22/24 0730  cefTRIAXone  (ROCEPHIN ) 1 g in sodium chloride  0.9 % 100 mL IVPB        1 g 200 mL/hr over 30 Minutes Intravenous Every 24 hours 03/22/24 0601          I have personally reviewed the following labs and images: CBC: Recent Labs  Lab 03/21/24 1846 03/22/24 0154  WBC 12.9* 9.7  NEUTROABS 10.7* 7.7  HGB 12.1 10.8*  HCT 40.8 37.1  MCV 92.5 92.1  PLT 202 174   BMP &GFR Recent Labs  Lab 03/21/24 2036 03/22/24 0154  NA 150* 165*  K 3.7 3.4*  CL 117* 127*  CO2 14* 26  GLUCOSE 70 122*  BUN 41* 66*  CREATININE 0.41* 0.83  CALCIUM 7.7* 9.5  MG 1.4* 3.4*  PHOS  --  2.7   Estimated Creatinine Clearance: 28.5 mL/min (by C-G formula based on SCr of 0.83 mg/dL). Liver & Pancreas: Recent Labs  Lab 03/21/24 2036  AST 24  ALT 13  ALKPHOS 36*  BILITOT 0.8  PROT 3.5*  ALBUMIN 1.8*   Recent Labs  Lab 03/21/24 2036  LIPASE <10*   No results for input(s): AMMONIA in the last 168 hours. Diabetic: No results for input(s): HGBA1C in the last 72 hours. Recent Labs  Lab 03/22/24 0128 03/22/24 0437 03/22/24 0723  GLUCAP 102* 125* 134*   Cardiac Enzymes: No results for input(s): CKTOTAL, CKMB, CKMBINDEX, TROPONINI in the last 168 hours. No results for input(s): PROBNP in the last 8760 hours. Coagulation Profile: No results for input(s): INR, PROTIME in the last 168 hours. Thyroid  Function Tests: No results for input(s): TSH, T4TOTAL, FREET4, T3FREE, THYROIDAB in the last 72 hours. Lipid Profile: No results for input(s): CHOL, HDL, LDLCALC, TRIG, CHOLHDL, LDLDIRECT in the last 72 hours. Anemia Panel: No results for input(s): VITAMINB12, FOLATE, FERRITIN, TIBC, IRON, RETICCTPCT in  the last 72 hours. Urine analysis:    Component Value Date/Time   COLORURINE AMBER (A) 03/21/2024 2339   APPEARANCEUR TURBID (A) 03/21/2024 2339   LABSPEC 1.045 (H) 03/21/2024 2339   PHURINE 6.0 03/21/2024 2339   GLUCOSEU NEGATIVE 03/21/2024 2339   GLUCOSEU NEGATIVE 04/14/2016 0952   HGBUR LARGE (A) 03/21/2024 2339   BILIRUBINUR NEGATIVE 03/21/2024 2339   KETONESUR NEGATIVE 03/21/2024 2339   PROTEINUR 100 (A) 03/21/2024 2339   UROBILINOGEN 0.2 04/14/2016 0952   NITRITE POSITIVE (A) 03/21/2024 2339   LEUKOCYTESUR LARGE (A) 03/21/2024 2339   Sepsis Labs: Invalid input(s): PROCALCITONIN, LACTICIDVEN  Microbiology: No results found for this or any previous visit (from the past 240 hours).  Radiology Studies: CT ABDOMEN PELVIS W CONTRAST Result Date: 03/21/2024 EXAM: CT ABDOMEN AND PELVIS WITH CONTRAST 03/21/2024 10:00:00 PM TECHNIQUE: CT of the abdomen and pelvis was performed with the administration of 40 ml omnipaque  350. Multiplanar reformatted images are provided for review. Automated exposure control, iterative reconstruction, and/or weight-based adjustment of the mA/kV was utilized to reduce the radiation dose to  as low as reasonably achievable. COMPARISON: None available. CLINICAL HISTORY: Acute abdominal pain. FINDINGS: LOWER CHEST: No acute abnormality. LIVER: The liver demonstrates very mild central biliary ductal dilatation. GALLBLADDER AND BILE DUCTS: The gallbladder is well distended. The common bile duct appears within normal limits. SPLEEN: No acute abnormality. PANCREAS: No acute abnormality. ADRENAL GLANDS: No acute abnormality. KIDNEYS, URETERS AND BLADDER: Kidneys demonstrate no renal calculi or urinary tract obstructive changes. No perinephric or periureteral stranding. The bladder is partially distended. GI AND BOWEL: The small bowel and stomach are unremarkable. The appendix is within normal limits. The rectum shows considerable fecal material within, which may  represent early rectal impaction. No wall thickening is seen. No obstructive changes in the colon are noted. PERITONEUM AND RETROPERITONEUM: No ascites. No free air. VASCULATURE: The abdominal aorta demonstrates atherosclerotic calcification without aneurysmal dilatation. LYMPH NODES: No lymphadenopathy. REPRODUCTIVE ORGANS: The uterus has been surgically removed. BONES AND SOFT TISSUES: The bony structures show degenerative change of the lumbar spine. No acute osseous abnormality. No focal soft tissue abnormality. IMPRESSION: 1. Considerable rectal stool burden, which may represent early rectal impaction. 2. Very mild intrahepatic biliary ductal dilatation with normal caliber common bile duct, which can be further assessed with ultrasound if clinically indicated. Electronically signed by: Oneil Devonshire MD 03/21/2024 10:22 PM EST RP Workstation: [id number]   CT Angio Chest PE W and/or Wo Contrast Result Date: 03/21/2024 EXAM: CTA of the Chest with contrast for PE 03/21/2024 10:00:00 PM TECHNIQUE: CTA of the chest was performed without and with the administration of 80 mL of iohexol  (OMNIPAQUE ) 350 MG/ML intravenous contrast. Multiplanar reformatted images are provided for review. MIP images are provided for review. Automated exposure control, iterative reconstruction, and/or weight based adjustment of the mA/kV was utilized to reduce the radiation dose to as low as reasonably achievable. COMPARISON: None available. CLINICAL HISTORY: Difficulty swallowing. FINDINGS: PULMONARY ARTERIES: The pulmonary artery shows a normal branching pattern bilaterally. No intraluminal filling defect to suggest pulmonary embolism is noted. Main pulmonary artery is normal in caliber. MEDIASTINUM: The aorta demonstrates atherosclerotic calcification without aneurysmal dilatation. The heart is at the upper limits of normal in size. The esophagus is within normal limits. LYMPH NODES: No mediastinal, hilar or axillary lymphadenopathy.  LUNGS AND PLEURA: Postsurgical changes are noted in the right lower lobe. The lungs are well aerated bilaterally. No focal infiltrate or effusion is seen. No sizable parenchymal nodules are noted. No pneumothorax. UPPER ABDOMEN: Limited images of the upper abdomen are unremarkable. SOFT TISSUES AND BONES: No acute bone or soft tissue abnormality. IMPRESSION: 1. No pulmonary embolism. 2. No acute pulmonary abnormality. Electronically signed by: Oneil Devonshire MD 03/21/2024 10:19 PM EST RP Workstation: [id number] Simmer T. Mehr Depaoli [facility]  If 7PM-7AM, please contact night-coverage [email or web address] 03/22/2024, 9:41 AM   "

## 2024-03-22 NOTE — Consult Note (Signed)
 "                                                                                   Consultation Note Date: 03/22/2024   Patient Name: Courtney Day  DOB: [birth year]  MRN: [id number]  Age / Sex: 89 y.o., female  PCP: Pcp, No Referring Physician: Kathrin Mignon DASEN, MD  Reason for Consultation:  to assist with establishing goals of care  HPI/Patient Profile: 89 y.o. female  with past medical history of alzheimer's dementia, asthma, DVT, dysphagia admitted on 03/21/2024 with hyponatremia, hypovolemia, UTI, lactic acidosis. She was admitted and started on IV fluids and antibiotics. Palliative medicine consulted for goals of care and medical decision making.    Primary Decision Maker NEXT OF KIN- spouse Hadli Vandemark  Discussion: Case discussed with Dr. Gonfa and bedside RN- patient with very advanced dementia, he previously discussed comfort measures with family. Per RN patient somnolent since arrival to floor, persistently hypotensive and poor urine output. Labs reviewed- CMET- Na 165, Albumin 1.8, CBC- WBC 12.9, UA positive for nitrites, leucocytes, many bacteria; lactic acid elevated- 2.5. Met with patient's spouse and niece at the bedside.   They previously lived in [place] and moved to [place] so that patient's sister could assist in patient's care due to her advancing dementia. However, patient's sister became disabled. Patient has been residing at [facility] nursing facility. She was known to be very loving and a social butterfly.  She is nonverbal at baseline, but spouse notes that she does acknowledge him with nods. Bedbound. Eats when fed by her husband. Unable to feed herself. Total assist for all care.  We discussed her advanced dementia and her current acute illnesses. Reviewed that this often becomes a cycle for patient's with advanced dementia until they die, or until family makes change in care plan to comfort measures and allow for natural dying with good symptom management and  dignity.  Marsha and Reading both express their understanding that even if we are able support patient through this acute process with IV fluids and antibiotics, she is likely to have decline again in the very near future.  Discussed transition to comfort measures only which includes stopping IV fluids, antibiotics, labs and providing symptom management for SOB, anxiety, nausea, vomiting, and other symptoms of dying.  Meade and Willie both expressed desire for transition to full comfort measures only.  We discussed hospice support, either at [facility] or inpt hospice if patient qualifies. They would like her to stay here tonight and re-evaluate for disposition in the morning.     SUMMARY OF RECOMMENDATIONS -UTI, severe dehydration, severe hypernatremia- in the setting of advanced Alzheimer's disease -Plan for transition to full comfort measures only -Symptom management- 2mg  morphine  IV q15 min prn for any signs of discomfort Lorazepam  1mg  po, SL or IV q4 hr anxiety Haldol  0.5mg  po, SL or IV q4 hr PRN agitation Glycopyrrolate  0.2mg  IV q4hr secretions   Code Status/Advance Care Planning:   Code Status: Do not attempt resuscitation (DNR) - Comfort care    Prognosis:   Hours - Days  Discharge Planning: To Be Determined  Primary Diagnoses: Present on Admission:  Failure to thrive in  adult   Review of Systems  Unable to perform ROS: Mental status change    Physical Exam Vitals and nursing note reviewed.  Constitutional:      General: She is not in acute distress.    Appearance: She is ill-appearing.     Comments: cachectic  Cardiovascular:     Rate and Rhythm: Normal rate.  Pulmonary:     Effort: Pulmonary effort is normal.  Neurological:     Comments: Somnolent, Responds to stimulation     Vital Signs: BP (!) 95/38 (BP Location: Left Arm)   Pulse 68   Temp 97.8 F (36.6 C) (Oral)   Resp 15   Ht 5' 2 (1.575 m)   Wt 37.8 kg   SpO2 100%   BMI 15.24 kg/m  Pain  Scale: PAINAD   Pain Score: 0-No pain   SpO2: SpO2: 100 % O2 Device:SpO2: 100 % O2 Flow Rate: .   IO: Intake/output summary: No intake or output data in the 24 hours ending 03/22/24 1304  LBM: Last BM Date :  (Unknown PTA) Baseline Weight: Weight: 37.8 kg Most recent weight: Weight: 37.8 kg       Thank you for this consult. Palliative medicine will continue to follow and assist as needed.   Signed by: Cassondra Stain, AGNP-C Palliative Medicine  Billing based on MDM: High  Problems Addressed: One or more chronic illnesses with severe exacerbation, progression, or side effects of treatment. and One acute or chronic illness or injury that poses a threat to life or bodily function  Category 1:Review of prior external note(s) from each unique source, Review of the result(s) of each unique test, and Assessment requiring an independent historian(s)  Risks: Parenteral controlled substances, Decision regarding hospitalization or escalation of hospital care, and Decision not to resuscitate or to de-escalate care because of poor prognosis   Please contact Palliative Medicine Team phone at [phone] for questions and concerns.  For individual provider: See Amion               "

## 2024-03-22 NOTE — Progress Notes (Deleted)
 Report given to Morna Moose RN

## 2024-03-22 NOTE — ED Notes (Signed)
 RN spoke to niece with update. Niece notified RN she will update pts husband.

## 2024-03-22 NOTE — Progress Notes (Signed)
" °   03/22/24 0750  Assess: MEWS Score  Temp 97.8 F (36.6 C)  BP (!) 83/57  Pulse Rate (!) 102  Resp (!) 22  Level of Consciousness Responds to Voice  SpO2 96 %  O2 Device Room Air  Assess: MEWS Score  MEWS Temp 0  MEWS Systolic 1  MEWS Pulse 1  MEWS RR 1  MEWS LOC 1  MEWS Score 4  MEWS Score Color Red  Assess: if the MEWS score is Yellow or Red  Were vital signs accurate and taken at a resting state? Yes  Does the patient meet 2 or more of the SIRS criteria? Yes  Does the patient have a confirmed or suspected source of infection? No  Notify: Charge Nurse/RN  Name of Charge Nurse/RN Notified Lonell Alstrom RN  Provider Notification  Provider Name/Title Dr. Kathrin  Date Provider Notified 03/22/24  Time Provider Notified [phone]  Method of Notification Page  Notification Reason Change in status (BP Low and red mews)  Assess: SIRS CRITERIA  SIRS Temperature  0  SIRS Respirations  1  SIRS Pulse 1  SIRS WBC 0  SIRS Score Sum  2    "

## 2024-03-22 NOTE — Progress Notes (Signed)
 PHARMACY - ANTICOAGULATION CONSULT NOTE  Pharmacy Consult for Lovenox  Indication: hx VTE  Allergies[1]  Patient Measurements: Height: 5' 2 (157.5 cm) Weight: 37.8 kg (83 lb 5.3 oz) IBW/kg (Calculated) : 50.1 HEPARIN DW (KG): 37.8  Vital Signs: Temp: 97.6 F (36.4 C) (01/28 0530) Temp Source: Oral (01/28 0100) BP: 98/70 (01/28 0530) Pulse Rate: 80 (01/28 0530)  Labs: Recent Labs    03/21/24 1846 03/21/24 2036 03/22/24 0154  HGB 12.1  --  10.8*  HCT 40.8  --  37.1  PLT 202  --  174  CREATININE  --  0.41* 0.83    Estimated Creatinine Clearance: 28.5 mL/min (by C-G formula based on SCr of 0.83 mg/dL).   Medical History: Past Medical History:  Diagnosis Date   Allergy    Asthma    > probably all pseudoasthma, -D/C ACE March 30,2010 > pseudowheeze resolved,cough resolved SPN RLL   Dementia (HCC) 2010   Dr. Onita   Depression    Hypertension    Knee pain    Bilateral   Low back pain    Osteoarthritis    Vitiligo 2010    Assessment: 89 yo F on Xarelto  for hx VTE.  She is currently unable to swallow meds.  Pharmacy consulted to switch to Lovenox .  Baseline CBC WNL, Scr 0.83.  Goal of Therapy:  Anti-Xa level 0.6-1 units/ml 4hrs after LMWH dose given Monitor platelets by anticoagulation protocol: Yes   Plan:  Lovenox  40mg  SQ q24h  **Note this is FULL DOSE based on patient's weight/renal fxn CBC q3 days  Rosaline Millet PharmD 03/22/2024,6:06 AM      [1]  Allergies Allergen Reactions   Ace Inhibitors Other (See Comments) and Cough    Allergic, per MAR   Amlodipine  Other (See Comments)    Dizziness and Allergic, per [facility]   Amlodipine  Besylate-Valsartan Other (See Comments)    Dizziness and Allergic, per [facility]   Namenda [Memantine Hcl] Other (See Comments)    Allergic, per MAR   Valsartan Other (See Comments)    Dizziness and Allergic, per [facility]

## 2024-03-22 NOTE — Consult Note (Addendum)
" °  CLINICAL SUPPORT TEAM - WOUND OSTOMY AND CONTINENCE TEAM  CONSULTATION SERVICES   WOC Nurse-Inpatient Note  This remote consultation was conducted using EMR wound digital images downloaded by the staff member. Pertinent information was reviewed in order to determine recommendations. Coordinated care with primary nurse via Secure Chat. WOC Nurse Consult Note: Reason for Consult: sacrum wound Damage noted over gluteal cleft, coccyx, sacrum, lower lumbar Wound type: DTPI with Epidermal lifting  Pressure Injury POA: Yes Measurement: approx 8x10 cm Wound bed: hypopigmentation (loss of melanocytes), non-viable tissue Drainage (amount, consistency, odor)  Periwound: scar tissue, maroon discoloration extending to outer edge Dressing procedure/placement/frequency:   Cleanse buttock wound with Vashe solution # [id number] pat dry with gauze and apply 2 layers of cut Xeroform yellow gauze #[id number] over wound, cover with Sacrum Mepilex foam dressing.Off load heels at all times onto pillows.  Daily, Cleanse left foot lesion with Vashe solution # [id number], leave on 10 minutes, then pat dry with gauze and apply 2 layers of Xeroform yellow gauze #[id number] over wound, cover with rolled Kerlix gauze, secure in place with tape.  Buttock 1/28  Left foot Lesion    Wound type: Lateral Left foot lesion, possible DTPI evolving  Wound bed: partial thickness skin loss, non-viable tissue Drainage see flowsheet Periwound: discoloration, due to darkly pigmented skin tone unable to determine if erythema is present Dressing procedure/placement/frequency: See above  Recommend Podiatry consult, but will place wound care orders until then.  Skin failure can not be ruled out at this time.  WOC will not follow and will remove patient from census task list. Please reconsult if wound worsens in condition and notify provider.   Sherrilyn Hals MSN RN CWOCN WOC [facility]  [phone] (Available from 7-3 pm  Mon-Friday)     "

## 2024-03-22 NOTE — IPAL (Signed)
" °  Interdisciplinary Goals of Care Family Meeting   Date carried out: 03/22/2024  Location of the meeting: Bedside  Member's involved: Physician and Family Member or next of kin (Courtney Day)  Durable Power of Insurance risk surveyor: Courtney Day Extensive goals of care discussion with patient's Courtney Day at bedside.  Patient with severe dementia, nonverbal and nonambulatory at baseline.  Admitted with severe dehydration with severe hypernatremia and possible urinary tract infection.  Those issues can be treated temporarily but she remains at risk for dehydration, hyponatremia and recurrent hospitalization.  A quality of life is not great.  We have discussed about treatment options including continuation of current intervention that comes with some course including discomfort and pain from frequent lab draws and needle sticks.  We have also discussed about full comfort care where we could only focus on patient's comfort and dignity to minimize pain and distress. He is somewhat undecided at this time.  He is aware of palliative consult.  Will continue IV fluid hydration, IV antibiotics and lab monitoring for now.  Discussion: We discussed goals of care for Courtney Day .    Code status:   Code Status: Limited: Do not attempt resuscitation (DNR) -DNR-LIMITED -Do Not Intubate/DNI    Disposition: Continue current acute care  Time spent for the meeting: 35 minutes    Courtney ONEIDA Bump, MD  03/22/2024, 12:02 PM   "

## 2024-03-22 NOTE — TOC Initial Note (Signed)
 Transition of Care [facility]) - Initial/Assessment Note   Patient Details  Name: Courtney Day MRN: [id number] Date of Birth: 1934-05-25  Transition of Care [facility]) CM/SW Contact:    Duwaine GORMAN Aran, LCSW Phone Number: 03/22/2024, 11:46 AM  Clinical Narrative: Patient is a long-term care resident at [facility]. CSW confirmed with Shona in admissions that patient can return when medically ready. Palliative consulted. Care management awaiting recommendations.  Expected Discharge Plan: Long Term Nursing Home Barriers to Discharge: Continued Medical Work up  Patient Goals and CMS Choice Patient states their goals for this hospitalization and ongoing recovery are:: Patient is nonverbal and disoriented x4  Expected Discharge Plan and Services In-house Referral: Clinical Social Work Living arrangements for the past 2 months: Skilled Nursing Facility  Prior Living Arrangements/Services Living arrangements for the past 2 months: Skilled Nursing Facility Lives with:: Facility Resident Patient language and need for interpreter reviewed:: Yes Do you feel safe going back to the place where you live?: Yes      Need for Family Participation in Patient Care: Yes (Comment) (Patient has dementia.) Care giver support system in place?: Yes (comment) Criminal Activity/Legal Involvement Pertinent to Current Situation/Hospitalization: No - Comment as needed  Activities of Daily Living ADL Screening (condition at time of admission) Independently performs ADLs?: No Does the patient have a NEW difficulty with bathing/dressing/toileting/self-feeding that is expected to last >3 days?: No (UTD) Does the patient have a NEW difficulty with getting in/out of bed, walking, or climbing stairs that is expected to last >3 days?: No (UTD) Does the patient have a NEW difficulty with communication that is expected to last >3 days?: No (UTD) Is the patient deaf or have difficulty hearing?: No (UTD) Does the patient have  difficulty seeing, even when wearing glasses/contacts?: No (UTD) Does the patient have difficulty concentrating, remembering, or making decisions?: No (UTD)  Emotional Assessment Attitude/Demeanor/Rapport: Unable to Assess Affect (typically observed): Unable to Assess Orientation: :  (Disoriented x4.) Alcohol  / Substance Use: Not Applicable Psych Involvement: No (comment)  Admission diagnosis:  Dehydration [E86.0] Failure to thrive in adult [R62.7] Patient Active Problem List   Diagnosis Date Noted   Failure to thrive in adult 03/21/2024   Pain due to onychomycosis of toenails of both feet 09/11/2022   Acute metabolic encephalopathy 03/16/2019   Severe dehydration 03/15/2019   Hypotension 03/15/2019   Hypernatremia 03/15/2019   AKI (acute kidney injury)    Sepsis secondary to UTI (HCC) 07/23/2018   Rash and nonspecific skin eruption 07/06/2018   DVT (deep venous thrombosis) (HCC) 07/05/2018   Well adult exam 05/18/2013   Pulmonary histoplasmosis 01/29/2012   Atrial fibrillation (HCC) 01/29/2012   Right-sided chest wall pain 09/01/2010   BREAST PAIN 03/18/2010   Alzheimer's disease (HCC) 11/26/2009   Vitiligo 11/26/2009   PULMONARY NODULE 05/22/2008   HYPOKALEMIA 05/14/2008   VERTIGO 05/14/2008   Allergic rhinitis 04/27/2008   Asthma 04/27/2008   Acute upper respiratory infection 03/26/2008   Nonspecific (abnormal) findings on radiological and other examination of body structure 03/26/2008   CHEST XRAY, ABNORMAL 03/26/2008   SINUSITIS, ACUTE 02/28/2008   Osteoarthritis 07/06/2007   SHOULDER PAIN 07/06/2007   LEG PAIN 05/03/2007   Situational depression 12/24/2006   Essential hypertension 12/24/2006   LOW BACK PAIN 12/24/2006   PCP:  Pcp, No Pharmacy:   [id number] [address], [place] - [address] [place] [address] Phone: [phone] Fax: [phone]  [facility] - [place],  - [address]  [address] [place] [address] Phone: [phone] Fax: [phone]  Social Drivers of Health (SDOH) Social History: SDOH Screenings   Food Insecurity: Patient Unable To Answer (03/22/2024)  Housing: Patient Unable To Answer (03/22/2024)  Transportation Needs: Patient Unable To Answer (03/22/2024)  Utilities: Patient Unable To Answer (03/22/2024)  Social Connections: Patient Unable To Answer (03/22/2024)  Tobacco Use: Low Risk (03/21/2024)   SDOH Interventions: Housing Interventions: Patient Unable to Answer  Readmission Risk Interventions     No data to display

## 2024-03-22 NOTE — ED Notes (Signed)
 RN spoke to husband with an update.

## 2024-03-22 NOTE — Progress Notes (Signed)
 Assumed care of patient at 1500 from Delon Albino, RN. Agree with previously documented assessment. Will continue plan of care.

## 2024-03-22 NOTE — Plan of Care (Signed)
  Problem: Clinical Measurements: Goal: Diagnostic test results will improve Outcome: Not Progressing   Problem: Nutrition: Goal: Adequate nutrition will be maintained Outcome: Not Progressing

## 2024-03-23 DIAGNOSIS — R627 Adult failure to thrive: Secondary | ICD-10-CM | POA: Diagnosis not present

## 2024-03-23 DIAGNOSIS — Z7189 Other specified counseling: Secondary | ICD-10-CM | POA: Diagnosis not present

## 2024-03-23 DIAGNOSIS — Z515 Encounter for palliative care: Secondary | ICD-10-CM

## 2024-03-23 MED ORDER — ACETAMINOPHEN 650 MG RE SUPP: 650.0000 mg | Freq: Four times a day (QID) | RECTAL | Status: AC | PRN

## 2024-03-23 MED ORDER — ONDANSETRON HCL 4 MG/2ML IJ SOLN: 4.0000 mg | Freq: Four times a day (QID) | INTRAMUSCULAR | Status: AC | PRN

## 2024-03-23 MED ORDER — GLYCOPYRROLATE 0.2 MG/ML IJ SOLN: 0.2000 mg | INTRAMUSCULAR | Status: AC | PRN

## 2024-03-23 MED ORDER — BIOTENE DRY MOUTH MT LIQD: 15.0000 mL | OROMUCOSAL | Status: AC | PRN

## 2024-03-23 MED ORDER — HALOPERIDOL LACTATE 5 MG/ML IJ SOLN: 0.5000 mg | INTRAMUSCULAR | Status: AC | PRN

## 2024-03-23 MED ORDER — LORAZEPAM 2 MG/ML IJ SOLN: 1.0000 mg | INTRAMUSCULAR | Status: AC | PRN

## 2024-03-23 MED ORDER — POLYVINYL ALCOHOL 1.4 % OP SOLN: 1.0000 [drp] | Freq: Four times a day (QID) | OPHTHALMIC | Status: AC | PRN

## 2024-03-23 MED ORDER — MORPHINE SULFATE (PF) 2 MG/ML IV SOLN: 2.0000 mg | INTRAVENOUS | Status: AC | PRN

## 2024-03-23 NOTE — TOC Progression Note (Signed)
 Transition of Care [facility]) - Progression Note   Patient Details  Name: Courtney Day MRN: [id number] Date of Birth: [birth year]  Transition [facility]) CM/SW Contact  Duwaine GORMAN Aran, LCSW Phone Number: 03/23/2024, 11:37 AM  Clinical Narrative: Care management consulted for inpatient hospice. CSW spoke with niece to offer choice and [facility] hosp[facility] house in [place] was chosen as it will be closer to family to visit. CSW made referral to Cheri with [facility]. Per Cheri, a bed will not be available today. [facility] to review patient. CSW notified Shona with [facility] that patient is not expected to return to the LTC bed. Care management awaiting approval for inpatient hospice and bed availability.  Expected Discharge Plan: Hospice Medical Facility Barriers to Discharge: Continued Medical Work up, Hospice Bed not available  Expected Discharge Plan and Services In-house Referral: Clinical Social Work Post Acute Care Choice: Hospice Living arrangements for the past 2 months: Skilled Nursing Facility            DME Arranged: N/A DME Agency: NA  Social Drivers of Health (SDOH) Interventions SDOH Screenings   Food Insecurity: Patient Unable To Answer (03/22/2024)  Housing: Patient Unable To Answer (03/22/2024)  Transportation Needs: Patient Unable To Answer (03/22/2024)  Utilities: Patient Unable To Answer (03/22/2024)  Social Connections: Patient Unable To Answer (03/22/2024)  Tobacco Use: Low Risk (03/21/2024)   Readmission Risk Interventions     No data to display

## 2024-03-23 NOTE — TOC Transition Note (Signed)
 Transition of Care [facility]) - Discharge Note  Patient Details  Name: Courtney Day MRN: [id number] Date of Birth: 01/14/1935  Transition of Care [facility]) CM/SW Contact:  Duwaine GORMAN Aran, LCSW Phone Number: 03/23/2024, 3:18 PM  Clinical Narrative: Patient has been approved for hospice house in [facility]. Cheri to complete consents with family. Facility can admit today. The number for report is [phone]. Discharge packet completed. PTAR scheduled. RN updated. Care management signing.  Final next level of care: Hospice Medical Facility Barriers to Discharge: Barriers Resolved  Patient Goals and CMS Choice Patient states their goals for this hospitalization and ongoing recovery are:: Go to inpatient hospice CMS Medicare.gov Compare Post Acute Care list provided to:: Patient Represenative (must comment) Chelsea Bellini (niece))  Discharge Placement       Patient chooses bed at: Other - please specify in the comment section below: ([facility]) Patient to be transferred to facility by: PTAR Name of family member notified: Meade Bellini (niece) Patient and family notified of of transfer: 03/23/24  Discharge Plan and Services Additional resources added to the After Visit Summary for   In-house Referral: Clinical Social Work Post Acute Care Choice: Hospice          DME Arranged: N/A DME Agency: NA  Social Drivers of Health (SDOH) Interventions SDOH Screenings   Food Insecurity: Patient Unable To Answer (03/22/2024)  Housing: Patient Unable To Answer (03/22/2024)  Transportation Needs: Patient Unable To Answer (03/22/2024)  Utilities: Patient Unable To Answer (03/22/2024)  Social Connections: Patient Unable To Answer (03/22/2024)  Tobacco Use: Low Risk (03/21/2024)   Readmission Risk Interventions     No data to display

## 2024-03-23 NOTE — Discharge Summary (Signed)
 "  Physician Discharge Summary  Courtney Day FMW:[id number] DOB: October 24, 1934 DOA: 03/21/2024  PCP: Freddrick, No  Admit date: 03/21/2024 Discharge date: 03/23/24  Admitted From: Nursing home Disposition:  Residential hospice/Inpatient hospice for end of life care  Discharge Condition: Stable for transfer CODE STATUS: DNR    Hospital course 89 year old F with PMH of Alzheimer's dementia nonverbal and nonambulatory at baseline, dysphagia on p.o. diet, paroxysmal A-fib, DVT on Xarelto , asthma and pulmonary nodule brought to ED after she stopped eating for about a day, and admitted with failure to thrive with severe dehydration and hypernatremia.   In ED, minimally responsive.  Stable vitals.  Very dehydrated with dry mucous membrane. Na 150 but trended up to 165 for repeat after LR fluid.  She was started on D5 water and admitted for further care.  Palliative medicine consulted.   The next day on 03/22/2024, patient was transitioned to full comfort care.  She is transferred to residential hospice on 03/23/2024 for end of life care  See individual problem list below for more.   Problems addressed during this hospitalization End-of-life care/full comfort care/DNR comfort-started on 03/22/2024 - Appreciate help by palliative medicine - Continue as needed meds for comfort - Transfer to residential hospice for end of life care   Failure to thrive in adult: On pured diet.  Stopped eating for a day prior to presentation. Severe Alzheimer's dementia without behavioral disturbance - Now full comfort care.   Severe hypovolemic hypernatremia: Likely secondary to severe dehydration with poor oral intake   Presumptive UTI, POA: UA consistent with UTI.  Urine culture pending.  Blood culture negative.   Hypotension: Multifactorial including dehydration and possible infection.  Blood cultures negative.   Acute metabolic encephalopathy?  Could be due to hypovolemia, hypernatremia and UTI.  Patient is  nonverbal and nonambulatory at baseline.   Hypokalemia/hypomagnesemia   History of DVT/paroxysmal A-fib: Rate controlled.   Severe malnutrition/failure to thrive Body mass index is 15.24 kg/m.   Pressure skin injury: Present on admission.  Body mass index is 15.24 kg/m.       Pressure skin injury: Present on admission  Wound 03/22/24 0130 Pressure Injury Foot Left;Distal Deep Tissue Pressure Injury - Purple or maroon localized area of discolored intact skin or blood-filled blister due to damage of underlying soft tissue from pressure and/or shear. (Active)     Wound 03/22/24 0130 Pressure Injury Sacrum Deep Tissue Pressure Injury - Purple or maroon localized area of discolored intact skin or blood-filled blister due to damage of underlying soft tissue from pressure and/or shear. (Active)    Consultations: Palliative care  Time spent 35  minutes  Vital signs Vitals:   03/22/24 1000 03/22/24 1226 03/22/24 1959 03/23/24 1255  BP: (!) 88/56 (!) 95/38 111/60 119/69  Pulse: 62 68 91 77  Temp: 97.9 F (36.6 C) 97.8 F (36.6 C) 98 F (36.7 C) 98.7 F (37.1 C)  Resp:  15 14 16   Height:      Weight:      SpO2: 96% 100% 95% 97%  TempSrc: Axillary Oral    BMI (Calculated):         Discharge exam   GENERAL: No apparent distress.  Appears frail.  Sleepy. RESP:  No IWOB.  On room air.  MSK/EXT:  No apparent deformity.  Significant muscle mass and subcu fat loss. NEURO: Sleepy.  Nonverbal.  Does not follow commands.  PSYCH: Calm.  No distress or agitation.  Discharge Instructions  Allergies as of 03/23/2024  Reactions   Ace Inhibitors Other (See Comments), Cough   Allergic, per [facility]   Amlodipine  Other (See Comments)   Dizziness and Allergic, per [facility]   Amlodipine  Besylate-valsartan Other (See Comments)   Dizziness and Allergic, per [facility]   Namenda [memantine Hcl] Other (See Comments)   Allergic, per [facility]   Valsartan Other (See Comments)   Dizziness and  Allergic, per [facility]        Medication List     STOP taking these medications    acetaminophen  325 MG tablet Commonly known as: TYLENOL  Replaced by: acetaminophen  650 MG suppository   Dermacloud Oint   donepezil  23 MG Tabs tablet Commonly known as: ARICEPT    hydrALAZINE  10 MG tablet Commonly known as: APRESOLINE    hydrALAZINE  50 MG tablet Commonly known as: APRESOLINE    Knee Compression Sleeve/S/M Misc   mirtazapine 7.5 MG tablet Commonly known as: REMERON   rivaroxaban  20 MG Tabs tablet Commonly known as: XARELTO    triamcinolone  cream 0.5 % Commonly known as: KENALOG    Vitamin D  (Ergocalciferol ) 1.25 MG (50000 UNIT) Caps capsule Commonly known as: DRISDOL    Vitamin D3 25 MCG (1000 UT) Caps       TAKE these medications    acetaminophen  650 MG suppository Commonly known as: TYLENOL  Place 1 suppository (650 mg total) rectally every 6 (six) hours as needed for mild pain (pain score 1-3) or fever (or Fever >/= 101). Replaces: acetaminophen  325 MG tablet   antiseptic oral rinse Liqd Apply 15 mLs topically as needed for dry mouth.   artificial tears ophthalmic solution Place 1 drop into both eyes 4 (four) times daily as needed for dry eyes.   glycopyrrolate  0.2 MG/ML injection Commonly known as: ROBINUL  Inject 1 mL (0.2 mg total) into the vein every 4 (four) hours as needed (excessive secretions).   haloperidol  lactate 5 MG/ML injection Commonly known as: HALDOL  Inject 0.1 mLs (0.5 mg total) into the vein every 4 (four) hours as needed (or delirium).   LORazepam  2 MG/ML injection Commonly known as: ATIVAN  Inject 0.5 mLs (1 mg total) into the vein every 4 (four) hours as needed for anxiety.   morphine  (PF) 2 MG/ML injection Inject 1 mL (2 mg total) into the vein every 15 (fifteen) minutes as needed (For any signs and symptoms of distress).   ondansetron  4 MG/2ML Soln injection Commonly known as: ZOFRAN  Inject 2 mLs (4 mg total) into the vein every 6  (six) hours as needed for nausea.         Procedures/Studies:   CT ABDOMEN PELVIS W CONTRAST Result Date: 03/21/2024 EXAM: CT ABDOMEN AND PELVIS WITH CONTRAST 03/21/2024 10:00:00 PM TECHNIQUE: CT of the abdomen and pelvis was performed with the administration of 40 ml omnipaque  350. Multiplanar reformatted images are provided for review. Automated exposure control, iterative reconstruction, and/or weight-based adjustment of the mA/kV was utilized to reduce the radiation dose to as low as reasonably achievable. COMPARISON: None available. CLINICAL HISTORY: Acute abdominal pain. FINDINGS: LOWER CHEST: No acute abnormality. LIVER: The liver demonstrates very mild central biliary ductal dilatation. GALLBLADDER AND BILE DUCTS: The gallbladder is well distended. The common bile duct appears within normal limits. SPLEEN: No acute abnormality. PANCREAS: No acute abnormality. ADRENAL GLANDS: No acute abnormality. KIDNEYS, URETERS AND BLADDER: Kidneys demonstrate no renal calculi or urinary tract obstructive changes. No perinephric or periureteral stranding. The bladder is partially distended. GI AND BOWEL: The small bowel and stomach are unremarkable. The appendix is within normal limits. The rectum shows considerable fecal  material within, which may represent early rectal impaction. No wall thickening is seen. No obstructive changes in the colon are noted. PERITONEUM AND RETROPERITONEUM: No ascites. No free air. VASCULATURE: The abdominal aorta demonstrates atherosclerotic calcification without aneurysmal dilatation. LYMPH NODES: No lymphadenopathy. REPRODUCTIVE ORGANS: The uterus has been surgically removed. BONES AND SOFT TISSUES: The bony structures show degenerative change of the lumbar spine. No acute osseous abnormality. No focal soft tissue abnormality. IMPRESSION: 1. Considerable rectal stool burden, which may represent early rectal impaction. 2. Very mild intrahepatic biliary ductal dilatation with  normal caliber common bile duct, which can be further assessed with ultrasound if clinically indicated. Electronically signed by: Oneil Devonshire MD 03/21/2024 10:22 PM EST RP Workstation: [id number]   CT Angio Chest PE W and/or Wo Contrast Result Date: 03/21/2024 EXAM: CTA of the Chest with contrast for PE 03/21/2024 10:00:00 PM TECHNIQUE: CTA of the chest was performed without and with the administration of 80 mL of iohexol  (OMNIPAQUE ) 350 MG/ML intravenous contrast. Multiplanar reformatted images are provided for review. MIP images are provided for review. Automated exposure control, iterative reconstruction, and/or weight based adjustment of the mA/kV was utilized to reduce the radiation dose to as low as reasonably achievable. COMPARISON: None available. CLINICAL HISTORY: Difficulty swallowing. FINDINGS: PULMONARY ARTERIES: The pulmonary artery shows a normal branching pattern bilaterally. No intraluminal filling defect to suggest pulmonary embolism is noted. Main pulmonary artery is normal in caliber. MEDIASTINUM: The aorta demonstrates atherosclerotic calcification without aneurysmal dilatation. The heart is at the upper limits of normal in size. The esophagus is within normal limits. LYMPH NODES: No mediastinal, hilar or axillary lymphadenopathy. LUNGS AND PLEURA: Postsurgical changes are noted in the right lower lobe. The lungs are well aerated bilaterally. No focal infiltrate or effusion is seen. No sizable parenchymal nodules are noted. No pneumothorax. UPPER ABDOMEN: Limited images of the upper abdomen are unremarkable. SOFT TISSUES AND BONES: No acute bone or soft tissue abnormality. IMPRESSION: 1. No pulmonary embolism. 2. No acute pulmonary abnormality. Electronically signed by: Oneil Devonshire MD 03/21/2024 10:19 PM EST RP Workstation: [id number]       The results of significant diagnostics from this hospitalization (including imaging, microbiology, ancillary and laboratory) are listed below for  reference.     Microbiology: Recent Results (from the past 240 hours)  Urine Culture (for pregnant, neutropenic or urologic patients or patients with an indwelling urinary catheter)     Status: Abnormal (Preliminary result)   Collection Time: 03/21/24  9:32 AM   Specimen: Urine, Clean Catch  Result Value Ref Range Status   Specimen Description   Final    URINE, CLEAN CATCH Performed at [facility], [address] [place], [place] [address]    Special Requests   Final    NONE Performed at [facility], [address], [place], [place] [address]    Culture (A)  Final    >=100,000 COLONIES/mL ESCHERICHIA COLI 30,000 COLONIES/mL ENTEROCOCCUS FAECALIS SUSCEPTIBILITIES TO FOLLOW CULTURE REINCUBATED FOR BETTER GROWTH Performed at [facility], [address], [place], [place] [address]    Report Status PENDING  Incomplete  Culture, blood (Routine X 2) w Reflex to ID Panel     Status: None (Preliminary result)   Collection Time: 03/22/24  9:52 AM   Specimen: BLOOD  Result Value Ref Range Status   Specimen Description   Final    BLOOD LEFT ANTECUBITAL Performed at [facility], [address] [place], [place] [address]  Special Requests   Final    BOTTLES DRAWN AEROBIC ONLY Blood Culture results may not be optimal due to an inadequate volume of blood received in culture bottles Performed at [facility], [address] [place], [place] [address]    Culture   Final    NO GROWTH < 24 HOURS Performed at [facility], [address], [place], [place] [address]    Report Status PENDING  Incomplete  Culture, blood (Routine X 2) w Reflex to ID Panel     Status: None (Preliminary result)   Collection Time: 03/22/24  9:56 AM   Specimen: BLOOD  Result Value Ref Range Status   Specimen Description   Final    BLOOD LEFT ANTECUBITAL Performed at [facility], [address] [place], [place] [address]    Special Requests   Final    BOTTLES DRAWN AEROBIC ONLY Blood Culture results may not be optimal due to an inadequate volume of blood received in culture bottles Performed at [facility], [address], [place], [place] [address]    Culture   Final    NO GROWTH < 24 HOURS Performed at [facility], [address] [place], [place] [address]    Report Status PENDING  Incomplete     Labs:  CBC: Recent Labs  Lab 03/21/24 1846 03/22/24 0154  WBC 12.9* 9.7  NEUTROABS 10.7* 7.7  HGB 12.1 10.8*  HCT 40.8 37.1  MCV 92.5 92.1  PLT 202 174   BMP &GFR Recent Labs  Lab 03/21/24 2036 03/22/24 0154 03/22/24 0952  NA 150* 165*  --   K 3.7 3.4*  --   CL 117* 127*  --   CO2 14* 26  --   GLUCOSE 70 122*  --   BUN 41* 66*  --   CREATININE 0.41* 0.83  --   CALCIUM 7.7* 9.5  --   MG 1.4* 3.4* 3.3*  PHOS  --  2.7  --    Estimated Creatinine Clearance: 28.5 mL/min (by C-G formula based on SCr of 0.83 mg/dL). Liver & Pancreas: Recent Labs  Lab 03/21/24 2036  AST 24  ALT 13  ALKPHOS 36*  BILITOT 0.8  PROT 3.5*  ALBUMIN 1.8*   Recent Labs  Lab 03/21/24 2036  LIPASE <10*   No results for input(s): AMMONIA in the last 168 hours. Diabetic: No results for input(s): HGBA1C in the last 72 hours. Recent Labs  Lab 03/22/24 0128 03/22/24 0437 03/22/24 0723 03/22/24 1226  GLUCAP 102* 125* 134* 96   Cardiac Enzymes: No results for input(s): CKTOTAL, CKMB, CKMBINDEX, TROPONINI in the last 168 hours. No results for input(s): PROBNP in the last 8760 hours. Coagulation Profile: No results for input(s): INR, PROTIME in the last 168 hours. Thyroid  Function Tests: No results for input(s): TSH, T4TOTAL, FREET4, T3FREE, THYROIDAB in the last 72 hours. Lipid Profile: No results for input(s): CHOL, HDL, LDLCALC, TRIG, CHOLHDL, LDLDIRECT in the last 72 hours. Anemia Panel: No results for  input(s): VITAMINB12, FOLATE, FERRITIN, TIBC, IRON, RETICCTPCT in the last 72 hours. Urine analysis:    Component Value Date/Time   COLORURINE AMBER (A) 03/21/2024 2339   APPEARANCEUR TURBID (A) 03/21/2024 2339   LABSPEC 1.045 (H) 03/21/2024 2339   PHURINE 6.0 03/21/2024 2339   GLUCOSEU NEGATIVE 03/21/2024 2339   GLUCOSEU NEGATIVE 04/14/2016 0952   HGBUR LARGE (A) 03/21/2024 2339   BILIRUBINUR NEGATIVE 03/21/2024 2339   KETONESUR NEGATIVE 03/21/2024  2339   PROTEINUR 100 (A) 03/21/2024 2339   UROBILINOGEN 0.2 04/14/2016 0952   NITRITE POSITIVE (A) 03/21/2024 2339   LEUKOCYTESUR LARGE (A) 03/21/2024 2339   Sepsis Labs: Invalid input(s): PROCALCITONIN, LACTICIDVEN   SIGNED:  Mayar Whittier T Calianna Kim, MD  [facility] 03/23/2024, 1:59 PM   "

## 2024-03-23 NOTE — Progress Notes (Signed)
 " PROGRESS NOTE  Courtney Day FMW:[id number] DOB: Jul 15, 1934   PCP: Pcp, No  Patient is from: Nursing home  DOA: 03/21/2024 LOS: 2  Chief complaints Chief Complaint  Patient presents with   Failure To Thrive     Brief Narrative / Interim history: 89 year old F with PMH of Alzheimer's dementia nonverbal and nonambulatory at baseline, dysphagia on p.o. diet, paroxysmal A-fib, DVT on Xarelto , asthma and pulmonary nodule brought to ED after she stopped eating for about a day, and admitted with failure to thrive with severe dehydration and hypernatremia.  In ED, minimally responsive.  Stable vitals.  Very dehydrated with dry mucous membrane. Na 150 but trended up to 165 for repeat after LR fluid.  She was started on D5 water and admitted for further care.  Palliative medicine consulted.  The next day, patient was transitioned to full comfort care.  Palliative medicine on board.    Subjective: Patient is sleepy.  No apparent distress.   Assessment and plan: End-of-life care/full comfort care/DNR comfort-started on 03/22/2024 - Appreciate help by palliative medicine - Continue as needed meds for comfort - Final disposition yet to be determined  Failure to thrive in adult: On pured diet.  Stopped eating for a day prior to presentation. Severe Alzheimer's dementia without behavioral disturbance - Now full comfort care.   Severe hypovolemic hypernatremia: Likely secondary to severe dehydration with poor oral intake  Presumptive UTI, POA: UA consistent with UTI.  Urine culture pending.  Blood culture negative.  Hypotension: Multifactorial including dehydration and possible infection.  Blood cultures negative.   Acute metabolic encephalopathy?  Could be due to hypovolemia, hypernatremia and UTI.  Patient is nonverbal and nonambulatory at baseline.   Hypokalemia/hypomagnesemia   History of DVT/paroxysmal A-fib: Rate controlled.  Severe malnutrition/failure to thrive Body mass  index is 15.24 kg/m.  Pressure skin injury: Present on admission. Wound 03/22/24 0130 Pressure Injury Foot Left;Distal Deep Tissue Pressure Injury - Purple or maroon localized area of discolored intact skin or blood-filled blister due to damage of underlying soft tissue from pressure and/or shear. (Active)     Wound 03/22/24 0130 Pressure Injury Sacrum Deep Tissue Pressure Injury - Purple or maroon localized area of discolored intact skin or blood-filled blister due to damage of underlying soft tissue from pressure and/or shear. (Active)   DVT prophylaxis:  Patient is full comfort care.  Code Status: DNR-comfort Family Communication: None at bedside Level of care: Palliative Care Status is: Inpatient Remains inpatient appropriate because: End-of-life care.   Final disposition: Yet to be determined   35 minutes with more than 50% spent in reviewing records, counseling patient/family and coordinating care.  Consultants:  Palliative medicine  Procedures: None  Microbiology summarized: Urine culture pending Blood culture NGTD.  Objective: Vitals:   03/22/24 0850 03/22/24 1000 03/22/24 1226 03/22/24 1959  BP: (!) 87/56 (!) 88/56 (!) 95/38 111/60  Pulse: 69 62 68 91  Resp:   15 14  Temp: 97.8 F (36.6 C) 97.9 F (36.6 C) 97.8 F (36.6 C) 98 F (36.7 C)  TempSrc: Axillary Axillary Oral   SpO2: 99% 96% 100% 95%  Weight:      Height:        Examination:  GENERAL: No apparent distress.  Appears frail.  Sleepy. RESP:  No IWOB.  On room air.  MSK/EXT:  No apparent deformity.  Significant muscle mass and subcu fat loss. NEURO: Sleepy.  Nonverbal.  Does not follow commands.  PSYCH: Calm.  No distress or agitation.  Sch Meds:  Scheduled Meds:   Continuous Infusions:   PRN Meds:.acetaminophen  **OR** acetaminophen , antiseptic oral rinse, artificial tears, glycopyrrolate  **OR** glycopyrrolate  **OR** glycopyrrolate , haloperidol  **OR** haloperidol  **OR** haloperidol   lactate, LORazepam  **OR** LORazepam  **OR** LORazepam , morphine  injection, ondansetron  **OR** ondansetron  (ZOFRAN ) IV  Antimicrobials: Anti-infectives (From admission, onward)    Start     Dose/Rate Route Frequency Ordered Stop   03/24/24 1330  vancomycin  (VANCOREADY) IVPB 750 mg/150 mL  Status:  Discontinued       Placed in Followed by Linked Group   750 mg 150 mL/hr over 60 Minutes Intravenous Every 48 hours 03/22/24 1229 03/22/24 1303   03/22/24 1330  vancomycin  (VANCOREADY) IVPB 750 mg/150 mL  Status:  Discontinued       Placed in Followed by Linked Group   750 mg 150 mL/hr over 60 Minutes Intravenous  Once 03/22/24 1229 03/22/24 1303   03/22/24 0730  cefTRIAXone  (ROCEPHIN ) 1 g in sodium chloride  0.9 % 100 mL IVPB  Status:  Discontinued        1 g 200 mL/hr over 30 Minutes Intravenous Every 24 hours 03/22/24 0601 03/22/24 1303        I have personally reviewed the following labs and images: CBC: Recent Labs  Lab 03/21/24 1846 03/22/24 0154  WBC 12.9* 9.7  NEUTROABS 10.7* 7.7  HGB 12.1 10.8*  HCT 40.8 37.1  MCV 92.5 92.1  PLT 202 174   BMP &GFR Recent Labs  Lab 03/21/24 2036 03/22/24 0154 03/22/24 0952  NA 150* 165*  --   K 3.7 3.4*  --   CL 117* 127*  --   CO2 14* 26  --   GLUCOSE 70 122*  --   BUN 41* 66*  --   CREATININE 0.41* 0.83  --   CALCIUM 7.7* 9.5  --   MG 1.4* 3.4* 3.3*  PHOS  --  2.7  --    Estimated Creatinine Clearance: 28.5 mL/min (by C-G formula based on SCr of 0.83 mg/dL). Liver & Pancreas: Recent Labs  Lab 03/21/24 2036  AST 24  ALT 13  ALKPHOS 36*  BILITOT 0.8  PROT 3.5*  ALBUMIN 1.8*   Recent Labs  Lab 03/21/24 2036  LIPASE <10*   No results for input(s): AMMONIA in the last 168 hours. Diabetic: No results for input(s): HGBA1C in the last 72 hours. Recent Labs  Lab 03/22/24 0128 03/22/24 0437 03/22/24 0723 03/22/24 1226  GLUCAP 102* 125* 134* 96   Cardiac Enzymes: No results for input(s): CKTOTAL, CKMB,  CKMBINDEX, TROPONINI in the last 168 hours. No results for input(s): PROBNP in the last 8760 hours. Coagulation Profile: No results for input(s): INR, PROTIME in the last 168 hours. Thyroid  Function Tests: No results for input(s): TSH, T4TOTAL, FREET4, T3FREE, THYROIDAB in the last 72 hours. Lipid Profile: No results for input(s): CHOL, HDL, LDLCALC, TRIG, CHOLHDL, LDLDIRECT in the last 72 hours. Anemia Panel: No results for input(s): VITAMINB12, FOLATE, FERRITIN, TIBC, IRON, RETICCTPCT in the last 72 hours. Urine analysis:    Component Value Date/Time   COLORURINE AMBER (A) 03/21/2024 2339   APPEARANCEUR TURBID (A) 03/21/2024 2339   LABSPEC 1.045 (H) 03/21/2024 2339   PHURINE 6.0 03/21/2024 2339   GLUCOSEU NEGATIVE 03/21/2024 2339   GLUCOSEU NEGATIVE 04/14/2016 0952   HGBUR LARGE (A) 03/21/2024 2339   BILIRUBINUR NEGATIVE 03/21/2024 2339   KETONESUR NEGATIVE 03/21/2024 2339   PROTEINUR 100 (A) 03/21/2024 2339   UROBILINOGEN 0.2 04/14/2016 0952   NITRITE POSITIVE (A) 03/21/2024 2339  LEUKOCYTESUR LARGE (A) 03/21/2024 2339   Sepsis Labs: Invalid input(s): PROCALCITONIN, LACTICIDVEN  Microbiology: Recent Results (from the past 240 hours)  Culture, blood (Routine X 2) w Reflex to ID Panel     Status: None (Preliminary result)   Collection Time: 03/22/24  9:52 AM   Specimen: BLOOD  Result Value Ref Range Status   Specimen Description   Final    BLOOD LEFT ANTECUBITAL Performed at [facility], [address], [place], [place] [address]    Special Requests   Final    BOTTLES DRAWN AEROBIC ONLY Blood Culture results may not be optimal due to an inadequate volume of blood received in culture bottles Performed at [facility], [address], [place], [place] [address]    Culture   Final    NO GROWTH < 24 HOURS Performed at [facility], [address], [place], [place] [address]     Report Status PENDING  Incomplete  Culture, blood (Routine X 2) w Reflex to ID Panel     Status: None (Preliminary result)   Collection Time: 03/22/24  9:56 AM   Specimen: BLOOD  Result Value Ref Range Status   Specimen Description   Final    BLOOD LEFT ANTECUBITAL Performed at [facility], [address], [place], [place] [address]    Special Requests   Final    BOTTLES DRAWN AEROBIC ONLY Blood Culture results may not be optimal due to an inadequate volume of blood received in culture bottles Performed at [facility], [address], [place], [place] [address]    Culture   Final    NO GROWTH < 24 HOURS Performed at [facility], [address], [place], [place] [address]    Report Status PENDING  Incomplete    Radiology Studies: No results found.     Devera Englander T. Tiberius Loftus [facility]  If 7PM-7AM, please contact night-coverage [email or web address] 03/23/2024, 11:02 AM   "

## 2024-03-23 NOTE — Progress Notes (Signed)
 "                                                                                                                                                                                                          Daily Progress Note   Patient Name: Courtney Day       Date: 03/23/2024 DOB: Jun 05, 1934  Age: 89 y.o. MRN#: [id number] Attending Physician: Kathrin Mignon DASEN, MD Primary Care Physician: Pcp, No Admit Date: 03/21/2024  Reason for Follow-up: Establishing goals of care  Patient Profile/HPI: 89 y.o. female  with past medical history of alzheimer's dementia, asthma, DVT, dysphagia admitted on 03/21/2024 with hyponatremia, hypovolemia, UTI, lactic acidosis. She was admitted and started on IV fluids and antibiotics. Palliative medicine consulted for goals of care and medical decision making.    Discussion: Chart reviewed.  On evaluation patient appeared a bit agitated. I requested RN to administer IV ativan .  Spoke with patient's niece Courtney Day by phone. I believe she is stable enough for transfer.  Courtney Day would like patient to be evaluated for inpatient hospice.  Review of Systems  Unable to perform ROS: Mental status change     Physical Exam Vitals reviewed.  Constitutional:      Appearance: She is ill-appearing.  Cardiovascular:     Rate and Rhythm: Normal rate.  Pulmonary:     Effort: Pulmonary effort is normal.  Neurological:     Comments: Responds to stimuli             Vital Signs: BP 111/60 (BP Location: Left Arm)   Pulse 91   Temp 98 F (36.7 C)   Resp 14   Ht 5' 2 (1.575 m)   Wt 37.8 kg   SpO2 95%   BMI 15.24 kg/m  SpO2: SpO2: 95 % O2 Device: O2 Device: Room Air O2 Flow Rate:    Intake/output summary:  Intake/Output Summary (Last 24 hours) at 03/23/2024 1121 Last data filed at 03/22/2024 1800 Gross per 24 hour  Intake 985.21 ml  Output --  Net 985.21 ml   LBM: Last BM Date : 03/22/24 Baseline Weight: Weight: 37.8 kg Most recent weight: Weight: 37.8 kg        Palliative Assessment/Data: PPS 10%      Patient Active Problem List   Diagnosis Date Noted   Advance care planning 03/22/2024   Failure to thrive in adult 03/21/2024   Pain due to onychomycosis of toenails of both feet 09/11/2022   Acute metabolic encephalopathy 03/16/2019   Severe dehydration 03/15/2019   Hypotension 03/15/2019  Hypernatremia 03/15/2019   AKI (acute kidney injury)    Sepsis secondary to UTI (HCC) 07/23/2018   Rash and nonspecific skin eruption 07/06/2018   DVT (deep venous thrombosis) (HCC) 07/05/2018   Well adult exam 05/18/2013   Pulmonary histoplasmosis 01/29/2012   Atrial fibrillation (HCC) 01/29/2012   Right-sided chest wall pain 09/01/2010   BREAST PAIN 03/18/2010   Alzheimer's disease (HCC) 11/26/2009   Vitiligo 11/26/2009   PULMONARY NODULE 05/22/2008   HYPOKALEMIA 05/14/2008   VERTIGO 05/14/2008   Allergic rhinitis 04/27/2008   Asthma 04/27/2008   Acute upper respiratory infection 03/26/2008   Nonspecific (abnormal) findings on radiological and other examination of body structure 03/26/2008   CHEST XRAY, ABNORMAL 03/26/2008   SINUSITIS, ACUTE 02/28/2008   Osteoarthritis 07/06/2007   SHOULDER PAIN 07/06/2007   LEG PAIN 05/03/2007   Situational depression 12/24/2006   Essential hypertension 12/24/2006   LOW BACK PAIN 12/24/2006    Palliative Care Assessment & Plan    Assessment/Recommendations/Plan  UTI, severe dehydration, severe hypernatremia, end stage Alzheimer's- comfort measures- continue IV morphine , continue IV lorazepam  TOC order placed for evaluation by hospice Discussed with attending Dr. Gonfa and Megan, LCSW   Code Status:   Code Status: Do not attempt resuscitation (DNR) - Comfort care   Prognosis:  < 2 weeks  Discharge Planning: To Be Determined    Thank you for allowing the Palliative Medicine Team to assist in the care of this patient.  Billing based on MDM: High  Problems Addressed: One acute or chronic  illness or injury that poses a threat to life or bodily function   Risks: Parenteral controlled substances   Cassondra Stain, AGNP-C Palliative Medicine   Please contact Palliative Medicine Team phone at [phone] for questions and concerns.        "

## 2024-03-23 NOTE — Plan of Care (Signed)
   Problem: Clinical Measurements: Goal: Will remain free from infection Outcome: Progressing Goal: Diagnostic test results will improve Outcome: Progressing

## 2024-03-23 NOTE — Progress Notes (Signed)
 SPIRITUAL CARE AND COUNSELING CONSULT NOTE   VISIT SUMMARY Manali's family is at her bedside surrounding her with love.  I provided emotional and spiritual support through listening as well as prayer, at their request. If any needs arise before she is transported to [facility], please page us .    SPIRITUAL ENCOUNTER                                                                                                                                                                      Type of Visit: Initial Care provided to:: Family Conversation partners present during encounter: Nurse Referral source: APP Reason for visit: End-of-life OnCall Visit: No   SPIRITUAL FRAMEWORK  Presenting Themes: Values and beliefs, Community and relationships Family Stress Factors: Loss of control   GOALS   Clinical Care Goals: Jodee will be moved this afternoon to the [facility].   INTERVENTIONS   Spiritual Care Interventions Made: Established relationship of care and support, Compassionate presence, Reflective listening, Bereavement/grief support, Prayer    INTERVENTION OUTCOMES   Outcomes: Connection to spiritual care  SPIRITUAL CARE PLAN   Spiritual Care Issues Still Outstanding: No further spiritual care needs at this time (see row info)    If immediate needs arise, please contact [facility] / Behavioral Health 24 hour on call [phone]   Evins Lamarr Caldron, Chaplain  03/23/2024 4:27 PM

## 2024-03-25 LAB — URINE CULTURE: Culture: >=100000 — AB

## 2024-03-27 LAB — CULTURE, BLOOD (ROUTINE X 2)
Culture: NO GROWTH
Culture: NO GROWTH
